# Patient Record
Sex: Female | Born: 1955 | Race: Black or African American | Hispanic: No | Smoking: Never smoker
Health system: Southern US, Community
[De-identification: ages and names within clinical notes are randomized; demographics above are authoritative.]

## PROBLEM LIST (undated history)

## (undated) DIAGNOSIS — U071 COVID-19: Secondary | ICD-10-CM

## (undated) DIAGNOSIS — H269 Unspecified cataract: Secondary | ICD-10-CM

## (undated) DIAGNOSIS — D509 Iron deficiency anemia, unspecified: Secondary | ICD-10-CM

## (undated) DIAGNOSIS — E559 Vitamin D deficiency, unspecified: Secondary | ICD-10-CM

## (undated) DIAGNOSIS — M1990 Unspecified osteoarthritis, unspecified site: Secondary | ICD-10-CM

## (undated) DIAGNOSIS — K5100 Ulcerative (chronic) pancolitis without complications: Secondary | ICD-10-CM

## (undated) DIAGNOSIS — G43909 Migraine, unspecified, not intractable, without status migrainosus: Secondary | ICD-10-CM

## (undated) DIAGNOSIS — M419 Scoliosis, unspecified: Secondary | ICD-10-CM

## (undated) DIAGNOSIS — D569 Thalassemia, unspecified: Secondary | ICD-10-CM

## (undated) DIAGNOSIS — K219 Gastro-esophageal reflux disease without esophagitis: Secondary | ICD-10-CM

## (undated) HISTORY — PX: BREAST EXCISIONAL BIOPSY: SUR124

## (undated) HISTORY — PX: COLONOSCOPY: SHX174

## (undated) HISTORY — DX: Ulcerative (chronic) pancolitis without complications: K51.00

## (undated) HISTORY — DX: Vitamin D deficiency, unspecified: E55.9

## (undated) HISTORY — PX: BACK SURGERY: SHX140

## (undated) HISTORY — PX: ABDOMINAL HYSTERECTOMY: SHX81

## (undated) HISTORY — PX: BREAST SURGERY: SHX581

## (undated) HISTORY — PX: KNEE SURGERY: SHX244

## (undated) HISTORY — DX: Unspecified osteoarthritis, unspecified site: M19.90

## (undated) HISTORY — DX: COVID-19: U07.1

## (undated) HISTORY — DX: Thalassemia, unspecified: D56.9

## (undated) HISTORY — DX: Iron deficiency anemia, unspecified: D50.9

## (undated) HISTORY — DX: Unspecified cataract: H26.9

## (undated) HISTORY — DX: Gastro-esophageal reflux disease without esophagitis: K21.9

## (undated) HISTORY — DX: Migraine, unspecified, not intractable, without status migrainosus: G43.909

---

## 2001-02-15 HISTORY — PX: ABDOMINAL HYSTERECTOMY: SHX81

## 2009-12-31 ENCOUNTER — Ambulatory Visit: Payer: Self-pay | Admitting: Family Medicine

## 2009-12-31 ENCOUNTER — Encounter: Payer: Self-pay | Admitting: Family Medicine

## 2009-12-31 DIAGNOSIS — R002 Palpitations: Secondary | ICD-10-CM | POA: Insufficient documentation

## 2009-12-31 DIAGNOSIS — J45909 Unspecified asthma, uncomplicated: Secondary | ICD-10-CM | POA: Insufficient documentation

## 2009-12-31 DIAGNOSIS — R0602 Shortness of breath: Secondary | ICD-10-CM | POA: Insufficient documentation

## 2009-12-31 DIAGNOSIS — J309 Allergic rhinitis, unspecified: Secondary | ICD-10-CM | POA: Insufficient documentation

## 2010-01-02 LAB — CONVERTED CEMR LAB
BUN: 15 mg/dL (ref 6–23)
Basophils Absolute: 0 10*3/uL (ref 0.0–0.1)
Basophils Relative: 0.8 % (ref 0.0–3.0)
CO2: 29 meq/L (ref 19–32)
Chloride: 103 meq/L (ref 96–112)
Creatinine, Ser: 0.8 mg/dL (ref 0.4–1.2)
Eosinophils Absolute: 0.1 10*3/uL (ref 0.0–0.7)
Glucose, Bld: 86 mg/dL (ref 70–99)
MCHC: 32.6 g/dL (ref 30.0–36.0)
MCV: 70.9 fL — ABNORMAL LOW (ref 78.0–100.0)
Monocytes Absolute: 0.4 10*3/uL (ref 0.1–1.0)
Neutrophils Relative %: 21 % — ABNORMAL LOW (ref 43.0–77.0)
Platelets: 267 10*3/uL (ref 150.0–400.0)
RDW: 15.1 % — ABNORMAL HIGH (ref 11.5–14.6)
TSH: 0.7 microintl units/mL (ref 0.35–5.50)

## 2010-01-05 ENCOUNTER — Telehealth (INDEPENDENT_AMBULATORY_CARE_PROVIDER_SITE_OTHER): Payer: Self-pay | Admitting: *Deleted

## 2010-01-16 ENCOUNTER — Ambulatory Visit: Payer: Self-pay

## 2010-01-16 ENCOUNTER — Encounter: Payer: Self-pay | Admitting: Family Medicine

## 2010-01-20 ENCOUNTER — Telehealth (INDEPENDENT_AMBULATORY_CARE_PROVIDER_SITE_OTHER): Payer: Self-pay | Admitting: *Deleted

## 2010-01-27 ENCOUNTER — Ambulatory Visit: Payer: Self-pay | Admitting: Family Medicine

## 2010-01-27 DIAGNOSIS — G43909 Migraine, unspecified, not intractable, without status migrainosus: Secondary | ICD-10-CM | POA: Insufficient documentation

## 2010-01-27 DIAGNOSIS — E069 Thyroiditis, unspecified: Secondary | ICD-10-CM | POA: Insufficient documentation

## 2010-01-29 ENCOUNTER — Telehealth (INDEPENDENT_AMBULATORY_CARE_PROVIDER_SITE_OTHER): Payer: Self-pay | Admitting: *Deleted

## 2010-01-29 LAB — CONVERTED CEMR LAB
Basophils Absolute: 0.1 10*3/uL (ref 0.0–0.1)
HCT: 36.6 % (ref 36.0–46.0)
Lymphs Abs: 2.5 10*3/uL (ref 0.7–4.0)
Monocytes Relative: 5 % (ref 3.0–12.0)
Platelets: 292 10*3/uL (ref 150.0–400.0)
RDW: 15.3 % — ABNORMAL HIGH (ref 11.5–14.6)
TSH: 0.85 microintl units/mL (ref 0.35–5.50)

## 2010-02-05 ENCOUNTER — Telehealth (INDEPENDENT_AMBULATORY_CARE_PROVIDER_SITE_OTHER): Payer: Self-pay | Admitting: *Deleted

## 2010-03-17 NOTE — Procedures (Signed)
Summary: summary report  summary report   Imported By: Parks Neptune 01/20/2010 16:34:13  _____________________________________________________________________  External Attachment:    Type:   Image     Comment:   External Document  Appended Document: summary report holter results normal- just having a few extra beats.  please call and let pt know  Appended Document: summary report See phone note

## 2010-03-17 NOTE — Progress Notes (Signed)
Summary: holter monitor  Phone Note Outgoing Call   Call placed by: Susette Racer,  January 05, 2010 1:18 PM Action Taken: Appt scheduled Summary of Call: Pt has appt. for 12/2 11 at 2:45pm to have monitor Initial call taken by: Susette Racer,  January 05, 2010 1:19 PM

## 2010-03-17 NOTE — Assessment & Plan Note (Signed)
Summary: NEW TO EST/KN   Vital Signs:  Patient profile:   55 year old female Height:      61.25 inches Weight:      202 pounds BMI:     37.99 Pulse rate:   105 / minute BP sitting:   126 / 78  (left arm)  Vitals Entered By: Malachi Bonds CMA (December 31, 2009 2:51 PM) CC: NEW EST- SOB and heart racing x1 month    History of Present Illness: 55 yo woman here today to establish care.  previous MD- Ruben Gottron (3-4 yrs ago).  Asthma- not currently on meds but does note SOB, chest tightness.  now c/o 'phlegm' in chest- cough is nonproductive.  denies wheezing.  palpitations- sxs started 1 month ago.  sxs occuring daily.  no relation to time of day or activity.  sxs can last minutes to hours.  will have associated lightheadedness.  denies CP.  does have associated SOB- occurs after the heart begins to race.  no hx of similar.  denies associated nausea.  family and friends tell her she is having panic attacks.  pt admits to 'always being a high stress person', denies any increase in stress recently.  Preventive Screening-Counseling & Management  Alcohol-Tobacco     Alcohol drinks/day: 0     Smoking Status: never  Caffeine-Diet-Exercise     Does Patient Exercise: no      Drug Use:  never.    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 No apparent alopecia or balding. Neck:  No deformities, masses, or tenderness noted. Lungs:  Normal respiratory effort, chest expands symmetrically. Lungs are clear to auscultation, no crackles or wheezes. Heart:  Normal rate and regular rhythm. S1 and S2 normal without gallop, murmur, click, rub or other extra sounds. Pulses:  +2 carotid, radial, DP Extremities:  No clubbing, cyanosis, edema, or deformity noted with normal full range of motion of all joints.   Cervical Nodes:  No lymphadenopathy noted Psych:  anxious   Impression & Recommendations:  Problem # 1:  PALPITATIONS (ICD-785.1) Assessment New must r/o arrhythmia- EKG normal but will order holter.  check electrolytes, CBC to r/o anemia, TSH.  may be stress/anxiety related.  alprazolam given to see if sxs improve w/ this. Orders: Venipuncture (02774) TLB-BMP (Basic Metabolic Panel-BMET) (12878-MVEHMCN) TLB-CBC Platelet - w/Differential (85025-CBCD) TLB-TSH (Thyroid Stimulating Hormone) (84443-TSH) Specimen Handling (99000) Cardiology Referral (Cardiology)  Problem # 2:  SHORTNESS OF BREATH (ICD-786.05) Assessment: New no wheezing on exam, no cough heard during OV.  reports sxs start after she is having palpitations.  ? whether this is anxiety related.  will follow closely.  Complete Medication List: 1)  Alprazolam 0.5 Mg Tabs (Alprazolam) .... Take 1 every 4 hrs as needed for anxiety.  Patient Instructions: 1)  Follow up in 3-4 weeks to discuss palpitations- sooner if  needed 2)  If you develop chest pain or pressure- go to the ER 3)  Someone will call you with your Holter monitor 4)  We'll notify you of your lab results 5)  If you have another episode, take the Alprazolam and see if this improves your symptoms 6)  Call with any questions or concerns 7)  Welcome!  We're glad to have you! Prescriptions: ALPRAZOLAM 0.5 MG  TABS (ALPRAZOLAM)  take 1 every 4 hrs as needed for anxiety.  #30 x 0   Entered and Authorized by:   [name] MD   Signed by:   [name] MD on 12/31/2009   Method used:   Print then Give to Patient   RxID:   [id number]    Orders Added: 1)  Venipuncture [09470] 2)  TLB-BMP (Basic Metabolic Panel-BMET) [[id number]] 3)  TLB-CBC Platelet - w/Differential [85025-CBCD] 4)  TLB-TSH (Thyroid Stimulating Hormone) [84443-TSH] 5)  Specimen Handling [99000] 6)  Cardiology Referral [Cardiology] 7)  New Patient Level II [54650]

## 2010-03-19 NOTE — Assessment & Plan Note (Signed)
Summary: rto 3-4 weeks/cbs   Vital Signs:  Patient profile:   55 year old female Weight:      200 pounds Pulse rate:   96 / minute BP sitting:   124 / 80  (left arm)  Vitals Entered By: Malachi Bonds CMA (January 27, 2010 10:52 AM) CC: f/u and would like med for migraines    History of Present Illness: 55 yo woman here today for   1) palpitations- pt reports a few mild episodes.  did not have an episode 'severe enough' to take the Alprazolam.  holter report was normal.  feels sxs are improving.  able to take a few deep breaths and calm herself down per report.  2) SOB- denies the extreme sxs she was having previously.  will still have mild episodes that accompany the palpitations.  3) Migraine- hx of similar, started at age 34.  took OTC Motrin w/out relief.  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  will have preceeding visual changes.  previously on preventative meds.  has photo and phonophobia.  + nausea.  Current Medications (verified): 1)  Alprazolam 0.5 Mg  Tabs (Alprazolam) .... Take 1 Every 4 Hrs As Needed For Anxiety. 2)  Sumatriptan Succinate 50 Mg Tabs (Sumatriptan Succinate) .Marland Kitchen.. 1 Tab By Mouth As Needed For Migraine.  If No Relief in 2 Hrs May Take 2nd Pill 3)  Promethazine Hcl 25 Mg  Tabs (Promethazine Hcl) .Marland Kitchen.. 1 Tab By Mouth Q6 As Needed For Nausea  Allergies (verified): No Known Drug Allergies  Past History:  Past medical, surgical, family and social histories (including risk factors) reviewed for relevance to current acute and chronic problems.  Past Medical History: Reviewed history from 12/31/2009 and no changes required. Migraines Asthma Arthritis  Allergic rhinitis  Past Surgical History: Reviewed history from 12/31/2009 and no changes required. Hysterectomy- ovaries remain L knee surgery (meniscus tear)  Family History: Reviewed history from 12/31/2009 and no changes required. CAD-no HTN-mother DM-no COLON CA-no STROKE-no COLON  CA-no mother died of PE  Social History: Reviewed history from 12/31/2009 and no changes required. 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 No apparent alopecia or balding. Eyes:  PERRL, EOMI, fundi normal.  + photophobia Ears:  External ear exam shows no significant lesions or deformities.  Otoscopic examination reveals clear canals, tympanic membranes are intact bilaterally without bulging, retraction, inflammation or discharge. Hearing is grossly normal bilaterally. Neck:  thyroid TTP but not noteably enlarged. Lungs:  Normal respiratory effort, chest expands symmetrically. Lungs are clear to auscultation, no crackles or wheezes. Heart:  Normal rate and regular rhythm. S1 and S2 normal without gallop, murmur, click, rub or other extra sounds. Pulses:  +2 carotid, radial, DP Neurologic:  No cranial nerve deficits noted. Station and gait are normal. Plantar reflexes are down-going bilaterally. DTRs are symmetrical throughout. Sensory, motor and coordinative functions appear intact.   Impression & Recommendations:  Problem # 1:  PALPITATIONS (ICD-785.1) Assessment Improved pt's holter results reviewed.  sxs improving w/ relaxation techniques.  likely anxiety related.  Problem # 2:  SHORTNESS OF BREATH (ICD-786.05) Assessment: Improved denies severe sxs like she was previously having.  will still have mild sxs w/ anxiety.  Problem # 3:  MIGRAINE HEADACHE (ICD-346.90)  Assessment: Deteriorated pt w/ current migraine.  offered toradol injxn but pt unwilling to do this b/c she has never taken it before and reports she must go back to work.  would like meds for after work- reports triptans have worked  previously.  script provided for imitrex and phenergan. Her updated medication list for this problem includes:    Sumatriptan Succinate 50 Mg Tabs (Sumatriptan succinate) .Marland Kitchen... 1 tab by mouth as needed for migraine.  if no relief in 2 hrs may take 2nd pill  Problem # 4:  THYROIDITIS (ICD-245.9) Assessment: New pt's thyroid is tender but not enlarged.  check labs. Orders: Venipuncture (79038) Specimen Handling (99000) TLB-TSH (Thyroid Stimulating Hormone) (84443-TSH) TLB-CBC Platelet - w/Differential (85025-CBCD)  Complete Medication List: 1)  Alprazolam 0.5 Mg Tabs (Alprazolam) .... Take 1 every 4 hrs as needed for anxiety. 2)  Sumatriptan Succinate 50 Mg Tabs (Sumatriptan succinate) .Marland Kitchen.. 1 tab by mouth as needed for migraine.  if no relief in 2 hrs may take 2nd pill 3)  Promethazine Hcl 25 Mg Tabs (Promethazine hcl) .Marland Kitchen.. 1 tab by mouth q6 as needed for nausea 4)  Ferrous Sulfate 325 (65 Fe) Mg Tabs (Ferrous sulfate) .... Take 1 tab once daily  Patient Instructions: 1)  Schedule your complete physical at your convenience- do not eat before this appt 2)  We'll notify you of your lab results 3)  Take the Excedrin Migraine early in the headache to prevent a full blown migraine 4)  Use the Imitrex (sumatriptan) once you have a migraine 5)  Call with any questions or concerns 6)  Happy Holidays!!! Prescriptions: PROMETHAZINE HCL 25 MG  TABS (PROMETHAZINE HCL) 1 tab by mouth Q6 as needed for nausea  #30 x 0   Entered and Authorized by:   [name] MD   Signed by:   [name] MD on 01/27/2010   Method used:   Electronically to        [facility] # [phone]* (retail)       [address]       [place], [place]  [address]       Ph: [phone]       Fax: [phone]   RxID:   [id number] SUMATRIPTAN SUCCINATE 50 MG TABS (SUMATRIPTAN SUCCINATE) 1 tab by mouth as needed for migraine.  if no relief in 2 hrs may take 2nd pill  #12 x 6   Entered and  Authorized by:   [name] MD   Signed by:   [name] MD on 01/27/2010   Method used:   Electronically to        [facility] # [phone]* (retail)       [address], [place]  [address]       Ph: [phone]       Fax: [phone]   RxID:   [id number]    Orders Added: 1)  Venipuncture [41030] 2)  Specimen Handling [13143] 8)  TLB-TSH (Thyroid Stimulating Hormone) [84443-TSH] 4)  TLB-CBC Platelet - w/Differential [85025-CBCD] 5)  Est. Patient Level IV [88757]

## 2010-03-19 NOTE — Progress Notes (Signed)
Summary: 12-6--LAB RESULT-lm  Phone Note Outgoing Call   Call placed by: Rolla Flatten CMA,  January 20, 2010 4:45 PM Details for Reason: holter results normal- just having a few extra beats.  please call and let pt know Summary of Call: left message to call office ..............Marland KitchenFelecia Deloach CMA  January 20, 2010 4:45 PM

## 2010-03-19 NOTE — Progress Notes (Signed)
Summary: f/u on pt-lmom  ---- Converted from flag ---- ---- 02/04/2010 1:14 PM, [name] MD wrote: please call and see if her thyroid pain has improved.  if not, will need thyroid US.  thanks! ------------------------------  Phone Note Outgoing Call   Call placed by: Malachi Bonds CMA,  February 05, 2010 8:14 AM Call placed to: Patient Summary of Call: left message on machine

## 2010-03-19 NOTE — Progress Notes (Signed)
Summary: labs  Phone Note Outgoing Call   Call placed by: Malachi Bonds CMA,  January 29, 2010 10:11 AM Call placed to: Patient Summary of Call: thyroid is normal, no evidence of infxn.  no sign of anemia but MCV is low indicating that she is likely iron deficient.  should start FeSO4 363m daily and stool softener b/c iron is constipating.   Follow-up for Phone Call        left message on machine .............Marland KitchenMalachi BondsCMA  January 29, 2010 10:11 AM  Discuss with patient Rx sent to pharmacy.......Marland Kitchenelecia Deloach CMA  January 29, 2010 11:00 AM     New/Updated Medications: FERROUS SULFATE 325 (65 FE) MG TABS (FERROUS SULFATE) Take 1 tab once daily Prescriptions: FERROUS SULFATE 325 (65 FE) MG TABS (FERROUS SULFATE) Take 1 tab once daily  #30 x 3   Entered by:   FRolla FlattenCMA   Authorized by:   KAnnye AsaMD   Signed by:   FRolla FlattenCMA on 01/29/2010   Method used:   Faxed to ...       [facility] # [phone] (retail)       [phone]      [place]  [address]      Ph: [phone]      Fax: [phone]  RxID:   [id number]

## 2012-05-19 ENCOUNTER — Ambulatory Visit (INDEPENDENT_AMBULATORY_CARE_PROVIDER_SITE_OTHER): Payer: BC Managed Care – PPO | Admitting: Family Medicine

## 2012-05-19 ENCOUNTER — Encounter: Payer: Self-pay | Admitting: Family Medicine

## 2012-05-19 VITALS — BP 118/70 | HR 88 | Temp 98.4°F | Ht 61.75 in | Wt 210.4 lb

## 2012-05-19 DIAGNOSIS — E2839 Other primary ovarian failure: Secondary | ICD-10-CM

## 2012-05-19 DIAGNOSIS — Z1331 Encounter for screening for depression: Secondary | ICD-10-CM

## 2012-05-19 DIAGNOSIS — G43909 Migraine, unspecified, not intractable, without status migrainosus: Secondary | ICD-10-CM

## 2012-05-19 DIAGNOSIS — Z1231 Encounter for screening mammogram for malignant neoplasm of breast: Secondary | ICD-10-CM

## 2012-05-19 DIAGNOSIS — Z1211 Encounter for screening for malignant neoplasm of colon: Secondary | ICD-10-CM

## 2012-05-19 DIAGNOSIS — Z0000 Encounter for general adult medical examination without abnormal findings: Secondary | ICD-10-CM

## 2012-05-19 DIAGNOSIS — L723 Sebaceous cyst: Secondary | ICD-10-CM

## 2012-05-19 LAB — HEPATIC FUNCTION PANEL
AST: 15 U/L (ref 0–37)
Albumin: 4.3 g/dL (ref 3.5–5.2)
Alkaline Phosphatase: 85 U/L (ref 39–117)
Bilirubin, Direct: 0.1 mg/dL (ref 0.0–0.3)
Indirect Bilirubin: 0.4 mg/dL (ref 0.0–0.9)
Total Bilirubin: 0.5 mg/dL (ref 0.3–1.2)

## 2012-05-19 LAB — CBC WITH DIFFERENTIAL/PLATELET
Basophils Absolute: 0 10*3/uL (ref 0.0–0.1)
Basophils Relative: 0 % (ref 0–1)
Eosinophils Absolute: 0.1 10*3/uL (ref 0.0–0.7)
Hemoglobin: 12.9 g/dL (ref 12.0–15.0)
MCH: 22.2 pg — ABNORMAL LOW (ref 26.0–34.0)
MCHC: 33.1 g/dL (ref 30.0–36.0)
Monocytes Relative: 5 % (ref 3–12)
Neutro Abs: 5 10*3/uL (ref 1.7–7.7)
Neutrophils Relative %: 59 % (ref 43–77)
Platelets: 336 10*3/uL (ref 150–400)

## 2012-05-19 LAB — LIPID PANEL
HDL: 68 mg/dL (ref >39)
LDL Cholesterol: 99 mg/dL (ref 0–99)
Total CHOL/HDL Ratio: 2.7 Ratio
Triglycerides: 72 mg/dL (ref <150)

## 2012-05-19 LAB — BASIC METABOLIC PANEL
Calcium: 9.7 mg/dL (ref 8.4–10.5)
Glucose, Bld: 85 mg/dL (ref 70–99)
Potassium: 4.1 mEq/L (ref 3.5–5.3)
Sodium: 141 mEq/L (ref 135–145)

## 2012-05-19 MED ORDER — ONDANSETRON 4 MG PO TBDP: 4.0000 mg | ORAL_TABLET | Freq: Three times a day (TID) | ORAL | Status: DC | PRN

## 2012-05-19 MED ORDER — SUMATRIPTAN SUCCINATE 100 MG PO TABS: 100.0000 mg | ORAL_TABLET | ORAL | Status: DC | PRN

## 2012-05-19 NOTE — Patient Instructions (Addendum)
We'll notify you of your lab results and make any changes if needed Someone will call you with your bone density, mammo and colonoscopy appts Take the imitrex as needed for migraines zofran as needed for nausea Someone will call you with your neuro appt Call with any questions or concerns [place]!

## 2012-05-19 NOTE — Progress Notes (Signed)
  Subjective:    Patient ID: [name], female    DOB: 08/09/1955, 57 y.o.   MRN: [id number]  HPI CPE- overdue on mammo, bone density, colonoscopy.  No need for paps due to hysterectomy.  Migraines- chronic problem, occuring ~5 days weekly.  No relief w/ tylenol or ibuprofen.  Has taken imitrex previously w/ good relief.  Previously seeing [facility].  + nausea.  + photo/phonophobia.  No auras.  Sebaceous cyst- L upper back, very swollen, tender.  Will drain when squeezed.  No surrounding redness.   Review of Systems Patient reports no vision/ hearing changes, adenopathy, fever, weight change,  persistant/recurrent hoarseness , swallowing issues, chest pain, edema, persistant/recurrent cough, hemoptysis, dyspnea (rest/exertional/paroxysmal nocturnal), gastrointestinal bleeding (melena, rectal bleeding), abdominal pain, significant heartburn, bowel changes, GU symptoms (dysuria, hematuria, incontinence), Gyn symptoms (abnormal  bleeding, pain),  syncope, focal weakness, memory loss, numbness & tingling, skin/nail changes, abnormal bruising or bleeding, anxiety, or depression.   + intermittent palpitations + hair loss    Objective:   Physical Exam  General Appearance:    Alert, cooperative, no distress, appears stated age  Head:    Normocephalic, without obvious abnormality, atraumatic  Eyes:    PERRL, conjunctiva/corneas clear, EOM's intact, fundi    benign, both eyes  Ears:    Normal TM's and external ear canals, both ears  Nose:   Nares normal, septum midline, mucosa normal, no drainage    or sinus tenderness  Throat:   Lips, mucosa, and tongue normal; teeth and gums normal  Neck:   Supple, symmetrical, trachea midline, no adenopathy;    Thyroid: no enlargement/tenderness/nodules  Back:     Symmetric, no curvature, ROM normal, no CVA tenderness  Lungs:     Clear to auscultation bilaterally, respirations unlabored  Chest Wall:    No tenderness or deformity   Heart:     Regular rate and rhythm, S1 and S2 normal, no murmur, rub   or gallop  Breast Exam:    No tenderness, masses, or nipple abnormality  Abdomen:     Soft, non-tender, bowel sounds active all four quadrants,    no masses, no organomegaly  Genitalia:    deferred  Rectal:    Extremities:   Extremities normal, atraumatic, no cyanosis or edema  Pulses:   2+ and symmetric all extremities  Skin:   Skin color, texture, turgor normal, no rashes or lesions.  Large sebaceous cyst on upper back just L of spine- copious foul smelling curd like material expressed w/ firm, continuous pressure  Lymph nodes:   Cervical, supraclavicular, and axillary nodes normal  Neurologic:   CNII-XII intact, normal strength, sensation and reflexes    throughout          Assessment & Plan:

## 2012-05-22 ENCOUNTER — Encounter: Payer: Self-pay | Admitting: *Deleted

## 2012-05-23 ENCOUNTER — Encounter: Payer: Self-pay | Admitting: Internal Medicine

## 2012-05-23 LAB — VITAMIN D 1,25 DIHYDROXY: Vitamin D 1, 25 (OH)2 Total: 108 pg/mL — ABNORMAL HIGH (ref 18–72)

## 2012-05-23 NOTE — Assessment & Plan Note (Signed)
New.  Pt tolerated drainage of cyst w/out difficulty.  Cyst is large, material expressed was foul smelling and there was a lot of it.  Reviewed w/ pt that only definitive tx to prevent recurrence would be surgical excision.  Pt aware but wants to hold on referral at this time.

## 2012-05-23 NOTE — Assessment & Plan Note (Signed)
Chronic problem.  Deteriorated.  Start triptan prn.  Refer to neuro due to increased frequency and severity of HAs.

## 2012-05-23 NOTE — Assessment & Plan Note (Signed)
Pt's PE WNL w/ exception of sebaceous cyst.  Overdue on health maintenance.  Referrals entered.  Check labs.  Anticipatory guidance provided.

## 2012-06-02 ENCOUNTER — Ambulatory Visit (INDEPENDENT_AMBULATORY_CARE_PROVIDER_SITE_OTHER): Payer: BC Managed Care – PPO | Admitting: Neurology

## 2012-06-02 ENCOUNTER — Encounter: Payer: Self-pay | Admitting: Neurology

## 2012-06-02 VITALS — BP 128/91 | HR 82 | Ht 61.0 in | Wt 209.0 lb

## 2012-06-02 DIAGNOSIS — G43909 Migraine, unspecified, not intractable, without status migrainosus: Secondary | ICD-10-CM

## 2012-06-02 MED ORDER — MAGNESIUM OXIDE -MG SUPPLEMENT 400 (240 MG) MG PO TABS: 400.0000 mg | ORAL_TABLET | Freq: Two times a day (BID) | ORAL | Status: DC

## 2012-06-02 MED ORDER — RIZATRIPTAN BENZOATE 10 MG PO TABS: 10.0000 mg | ORAL_TABLET | ORAL | Status: DC | PRN

## 2012-06-02 MED ORDER — RIBOFLAVIN 100 MG PO CAPS: 100.0000 mg | ORAL_CAPSULE | Freq: Two times a day (BID) | ORAL | Status: DC

## 2012-06-02 NOTE — Patient Instructions (Signed)
May take Maxalt together with Aleve

## 2012-06-02 NOTE — Progress Notes (Signed)
Review of Systems  Out of a complete 14 system review, the patient complains of only the following symptoms, and all other reviewed systems are negative.   Constitutional:   N/A Cardiovascular:  Swelling in legs Ear/Nose/Throat:  N/A Skin: N/A Eyes: N/A Respiratory: shortness of breath Gastroitestinal: N/A    Hematology/Lymphatic:  N/A Endocrine:  Increased thirst Musculoskeletal:  joint swelling,  aching muscles  Allergy/Immunology: running nose Neurological: Insomnia, restless leg Psychiatric:    Not enough sleep  PHYSICAL EXAMINATOINS:  Generalized: In no acute distress  Neck: Supple, no carotid bruits   Cardiac: Regular rate rhythm  Pulmonary: Clear to auscultation bilaterally  Musculoskeletal: No deformity  Neurological examination  Mentation: Alert oriented to time, place, history taking, and causual conversation  Cranial nerve II-XII: Pupils were equal round reactive to light extraocular movements were full, visual field were full on confrontational test. facial sensation and strength were normal. hearing was intact to finger rubbing bilaterally. Uvula tongue midline.  head turning and shoulder shrug and were normal and symmetric.Tongue protrusion into cheek strength was normal.  Motor: normal tone, bulk and strength.  Sensory: Intact to fine touch, pinprick, preserved vibratory sensation, and proprioception at toes.  Coordination: Normal finger to nose, heel-to-shin bilaterally there was no truncal ataxia  Gait: Rising up from seated position without assistance, normal stance, without trunk ataxia, moderate stride, good arm swing, smooth turning, able to perform tiptoe, and heel walking without difficulty.   Romberg signs: Negative  Deep tendon reflexes: Brachioradialis 2/2, biceps 2/2, triceps 2/2, patellar 2/2, Achilles 2/2, plantar responses were flexor bilaterally.  Assessment and plan: 57 years old right-handed [place] American female, with a long-standing  history of migraine headaches, now with frequent headaches,  1. Preventive medications magnesium oxide,riboflavin bid, 2. Maxalt prn. 3. RTC with Hoyle Sauer in 3 months

## 2012-06-23 ENCOUNTER — Ambulatory Visit
Admission: RE | Admit: 2012-06-23 | Discharge: 2012-06-23 | Disposition: A | Discharge status: Home / Self Care | Payer: BC Managed Care – PPO | Source: Ambulatory Visit | Attending: Family Medicine | Admitting: Family Medicine

## 2012-06-23 DIAGNOSIS — E2839 Other primary ovarian failure: Secondary | ICD-10-CM

## 2012-06-23 DIAGNOSIS — Z1231 Encounter for screening mammogram for malignant neoplasm of breast: Secondary | ICD-10-CM

## 2012-06-26 ENCOUNTER — Telehealth: Payer: Self-pay | Admitting: Nurse Practitioner

## 2012-06-26 ENCOUNTER — Ambulatory Visit (AMBULATORY_SURGERY_CENTER): Payer: BC Managed Care – PPO | Admitting: *Deleted

## 2012-06-26 ENCOUNTER — Encounter: Payer: Self-pay | Admitting: Internal Medicine

## 2012-06-26 VITALS — Ht 61.0 in | Wt 212.4 lb

## 2012-06-26 DIAGNOSIS — Z1211 Encounter for screening for malignant neoplasm of colon: Secondary | ICD-10-CM

## 2012-06-26 MED ORDER — MOVIPREP 100 G PO SOLR: 1.0000 | Freq: Once | ORAL | Status: DC

## 2012-06-27 ENCOUNTER — Encounter: Payer: Self-pay | Admitting: General Practice

## 2012-07-07 ENCOUNTER — Ambulatory Visit (AMBULATORY_SURGERY_CENTER): Payer: BC Managed Care – PPO | Admitting: Internal Medicine

## 2012-07-07 ENCOUNTER — Encounter: Payer: Self-pay | Admitting: Internal Medicine

## 2012-07-07 VITALS — BP 136/77 | HR 69 | Temp 97.9°F | Resp 15 | Ht 61.0 in | Wt 212.0 lb

## 2012-07-07 DIAGNOSIS — K633 Ulcer of intestine: Secondary | ICD-10-CM

## 2012-07-07 DIAGNOSIS — K5289 Other specified noninfective gastroenteritis and colitis: Secondary | ICD-10-CM

## 2012-07-07 DIAGNOSIS — Z1211 Encounter for screening for malignant neoplasm of colon: Secondary | ICD-10-CM

## 2012-07-07 MED ORDER — SODIUM CHLORIDE 0.9 % IV SOLN: 500.0000 mL | INTRAVENOUS | Status: DC

## 2012-07-07 NOTE — Patient Instructions (Addendum)
YOU HAD AN ENDOSCOPIC PROCEDURE TODAY AT [facility]: Refer to the procedure report that was given to you for any specific questions about what was found during the examination.  If the procedure report does not answer your questions, please call your gastroenterologist to clarify.  If you requested that your care partner not be given the details of your procedure findings, then the procedure report has been included in a sealed envelope for you to review at your convenience later.  YOU SHOULD EXPECT: Some feelings of bloating in the abdomen. Passage of more gas than usual.  Walking can help get rid of the air that was put into your GI tract during the procedure and reduce the bloating. If you had a lower endoscopy (such as a colonoscopy or flexible sigmoidoscopy) you may notice spotting of blood in your stool or on the toilet paper. If you underwent a bowel prep for your procedure, then you may not have a normal bowel movement for a few days.  DIET: Your first meal following the procedure should be a light meal and then it is ok to progress to your normal diet.  A half-sandwich or bowl of soup is an example of a good first meal.  Heavy or fried foods are harder to digest and may make you feel nauseous or bloated.  Likewise meals heavy in dairy and vegetables can cause extra gas to form and this can also increase the bloating.  Drink plenty of fluids but you should avoid alcoholic beverages for 24 hours.  ACTIVITY: Your care partner should take you home directly after the procedure.  You should plan to take it easy, moving slowly for the rest of the day.  You can resume normal activity the day after the procedure however you should NOT DRIVE or use heavy machinery for 24 hours (because of the sedation medicines used during the test).    SYMPTOMS TO REPORT IMMEDIATELY: A gastroenterologist can be reached at any hour.  During normal business hours, 8:30 AM to 5:00 PM Monday through Friday,  call [phone].  After hours and on weekends, please call the GI answering service at [phone] who will take a message and have the physician on call contact you.   Following lower endoscopy (colonoscopy or flexible sigmoidoscopy):  Excessive amounts of blood in the stool  Significant tenderness or worsening of abdominal pains  Swelling of the abdomen that is new, acute  Fever of 100F or higher    FOLLOW UP: If any biopsies were taken you will be contacted by phone or by letter within the next 1-3 weeks.  Call your gastroenterologist if you have not heard about the biopsies in 3 weeks.  Our staff will call the home number listed on your records the next business day following your procedure to check on you and address any questions or concerns that you may have at that time regarding the information given to you following your procedure. This is a courtesy call and so if there is no answer at the home number and we have not heard from you through the emergency physician on call, we will assume that you have returned to your regular daily activities without incident.  SIGNATURES/CONFIDENTIALITY: You and/or your care partner have signed paperwork which will be entered into your electronic medical record.  These signatures attest to the fact that that the information above on your After Visit Summary has been reviewed and is understood.  Full responsibility of the confidentiality  of this discharge information lies with you and/or your care-partner.   Await biopsy results.   Next colonoscopy will be scheduled after pathology results are reviewed by Dr. Hilarie Fredrickson.  He will let you know.

## 2012-07-07 NOTE — Progress Notes (Signed)
Patient did not experience any of the following events: a burn prior to discharge; a fall within the facility; wrong site/side/patient/procedure/implant event; or a hospital transfer or hospital admission upon discharge from the facility. (G8907) Patient did not have preoperative order for IV antibiotic SSI prophylaxis. (G8918)  

## 2012-07-07 NOTE — Progress Notes (Signed)
Called to room to assist during endoscopic procedure.  Patient ID and intended procedure confirmed with present staff. Received instructions for my participation in the procedure from the performing physician.  

## 2012-07-07 NOTE — Op Note (Signed)
[place]  [address] [place], [id number]   COLONOSCOPY PROCEDURE REPORT  PATIENT: Patricia, Benjamin  MR#: [id number] BIRTHDATE: 05-16-1955 , 79  yrs. old GENDER: Female ENDOSCOPIST: Jerene Bears, MD REFERRED [id number] Birdie Riddle, M.D. PROCEDURE DATE:  07/07/2012 PROCEDURE:   Colonoscopy with biopsy ASA CLASS:   Class II INDICATIONS:average risk screening and Last colonoscopy performed greater than 20 years ago ([place] and follow-up in [place], diagnosed with UC, prolonged clinical remission). MEDICATIONS: MAC sedation, administered by CRNA and Propofol (Diprivan) 230 mg IV  DESCRIPTION OF PROCEDURE:   After the risks benefits and alternatives of the procedure were thoroughly explained, informed consent was obtained.  A digital rectal exam revealed decreased sphincter tone and A digital rectal exam revealed no rectal mass. The LB [id number]  endoscope was introduced through the anus and advanced to the terminal ileum which was intubated for a short distance. No adverse events experienced.   The quality of the prep was good, using MoviPrep  The instrument was then slowly withdrawn as the colon was fully examined.   COLON FINDINGS: The mucosa appeared normal in the terminal ileum. Very mild, small and shallow aphthous ulceration without significant associated erythema was found throughout the entire colon (rectum to cecum).  Multiple biopsies were performed using cold forceps.  No other abnormalities seen. Retroflexed views revealed no abnormalities. The time to cecum=2 minutes 42 seconds. Withdrawal time=10 minutes 21 seconds.  The scope was withdrawn and the procedure completed. COMPLICATIONS: There were no complications.  ENDOSCOPIC IMPRESSION: 1.   Normal mucosa in the terminal ileum 2.   Mild aphthous ulceration throughout the colon; multiple biopsies were performed using cold forceps  RECOMMENDATIONS: 1.  Await pathology results 2.  Timing of  repeat colonoscopy will be determined by pathology findings. 3.  You will receive a letter within 1-2 weeks with the results of your biopsy as well as final recommendations.  Please call my office if you have not received a letter after 3 weeks.   eSigned:  Jerene Bears, MD 07/07/2012 8:48 AM   cc: The Patient

## 2012-07-11 ENCOUNTER — Telehealth: Payer: Self-pay | Admitting: *Deleted

## 2012-07-11 NOTE — Telephone Encounter (Signed)
No identifier, left message follow-up

## 2012-07-13 ENCOUNTER — Encounter: Payer: Self-pay | Admitting: Internal Medicine

## 2012-07-14 ENCOUNTER — Telehealth: Payer: Self-pay | Admitting: *Deleted

## 2012-07-14 NOTE — Telephone Encounter (Signed)
Informed pt Dr Hilarie Fredrickson would like for her to schedule a f/u appt. Pt stated understanding and will come 07/25/12 at 0900am.

## 2012-07-14 NOTE — Telephone Encounter (Signed)
Letter from: Jerene Bears   Reason for Letter: Results Review   Comments: recall to be determined   Caren Griffins, please schedule office visit for followup-ulcerative colitis         Letters   lmom for pt to call back.

## 2012-07-20 ENCOUNTER — Encounter: Payer: Self-pay | Admitting: Internal Medicine

## 2012-07-25 ENCOUNTER — Ambulatory Visit (INDEPENDENT_AMBULATORY_CARE_PROVIDER_SITE_OTHER): Payer: BC Managed Care – PPO | Admitting: Internal Medicine

## 2012-07-25 ENCOUNTER — Encounter: Payer: Self-pay | Admitting: Internal Medicine

## 2012-07-25 VITALS — BP 118/78 | HR 86 | Ht 61.0 in | Wt 209.8 lb

## 2012-07-25 DIAGNOSIS — K5100 Ulcerative (chronic) pancolitis without complications: Secondary | ICD-10-CM | POA: Insufficient documentation

## 2012-07-25 DIAGNOSIS — K5180 Other ulcerative colitis without complications: Secondary | ICD-10-CM

## 2012-07-25 DIAGNOSIS — K51918 Ulcerative colitis, unspecified with other complication: Secondary | ICD-10-CM

## 2012-07-25 MED ORDER — MESALAMINE 1.2 G PO TBEC: 1200.0000 mg | DELAYED_RELEASE_TABLET | Freq: Four times a day (QID) | ORAL | Status: DC

## 2012-07-25 NOTE — Patient Instructions (Addendum)
We have sent the following medications to your pharmacy for you to pick up at your convenience: Lialda; you were given samples today and a prescription was sent to your pharmacy; please take as directed for 8 weeks  Follow up with Dr. Hilarie Fredrickson in office in 6 weeks                                                We are excited to introduce MyChart, a new best-in-class service that provides you online access to important information in your electronic medical record. We want to make it easier for you to view your health information - all in one secure location - when and where you need it. We expect MyChart will enhance the quality of care and service we provide.  When you register for MyChart, you can:    View your test results.    Request appointments and receive appointment reminders via email.    Request medication renewals.    View your medical history, allergies, medications and immunizations.    Communicate with your physician's office through a password-protected site.    Conveniently print information such as your medication lists.  To find out if MyChart is right for you, please talk to a member of our clinical staff today. We will gladly answer your questions about this free health and wellness tool.  If you are age 40 or older and want a member of your family to have access to your record, you must provide written consent by completing a proxy form available at our office. Please speak to our clinical staff about guidelines regarding accounts for patients younger than age 46.  As you activate your MyChart account and need any technical assistance, please call the MyChart technical support line at [phone]) or email your question to [email or web address]. If you email your question(s), please include your name, a return phone number and the best time to reach you.  If you have non-urgent health-related questions, you can send a message to our office through  [place] at [email or web address]. If you have a medical emergency, call 911.  Thank you for using MyChart as your new health and wellness resource!   MyChart licensed from [employer],  1999-2010. Patents Pending.

## 2012-07-25 NOTE — Progress Notes (Signed)
Patient ID: [name], female   DOB: 1955-02-21, 57 y.o.   MRN: [id number] HPI: Mrs. Sukhu is a 57 year old female with a past medical history of migraines, mild asthma, and remote colitis diagnosed in [place] approximately 15 years ago who is seen in followup after screening colonoscopy. She is alone today. Screening colonoscopy was performed on 07/07/2012 which revealed a normal terminal ileum, and mild aphthous ulcerations throughout the colon from cecum to rectum. Biopsies were performed which showed chronic active colitis consistent with inflammatory bowel disease. She returns today to discuss these findings. She reports that overall she feels well though she does have intermittent issues with loose bloody stools, abdominal pain. She has lived with this for many years and there've been times when it has seemed better. When she was diagnosed she recalls being on per rectum medication but has been off for over a decade. She does not recall the medication specifically. She reports now her bowel habits range from a good day which is 2-3 formed stools per day without blood, 25-7 loose stools per day associated with lower abdominal cramping. She reports a good appetite without nausea or vomiting. No heartburn, dysphagia or odynophagia. No mouth ulcers, rashes, eye complaints. She does have issues with knee pain. No family history of inflammatory bowel disease. No fevers or chills. No weight loss.  Patient Active Problem List   Diagnosis Date Noted  . Ulcerative colitis, universal 07/25/2012  . Routine general medical examination at a health care facility 05/19/2012  . Sebaceous cyst 05/19/2012  . THYROIDITIS 01/27/2010  . MIGRAINE HEADACHE 01/27/2010  . ALLERGIC RHINITIS 12/31/2009  . ASTHMA 12/31/2009  . PALPITATIONS 12/31/2009  . SHORTNESS OF BREATH 12/31/2009    Past Surgical History  Procedure Laterality Date  . Abdominal hysterectomy      partial  . Knee surgery  12-13,11-10  . Breast  surgery    . Colonoscopy  20 + years ago    In [place].    Current Outpatient Prescriptions  Medication Sig Dispense Refill  . acetaminophen (TYLENOL) 325 MG tablet Take 650 mg by mouth every 6 (six) hours as needed for pain.      . Magnesium Oxide 400 (240 MG) MG TABS Take 400 mg by mouth 2 (two) times daily.  60 tablet  12  . Naproxen Sodium (ALEVE PO) Take 1 tablet by mouth as needed (3-4 x week).       . ondansetron (ZOFRAN-ODT) 4 MG disintegrating tablet Take 1 tablet (4 mg total) by mouth every 8 (eight) hours as needed for nausea.  20 tablet  0  . Riboflavin 100 MG CAPS Take 1 capsule (100 mg total) by mouth 2 (two) times daily.  60 capsule  12  . rizatriptan (MAXALT) 10 MG tablet Take 1 tablet (10 mg total) by mouth as needed for migraine. May repeat in 2 hours if needed  10 tablet  6  . mesalamine (LIALDA) 1.2 G EC tablet Take 1 tablet (1.2 g total) by mouth 4 (four) times daily.  120 tablet  1   No current facility-administered medications for this visit.    No Known Allergies  Family History  Problem Relation Age of Onset  . Diabetes Brother   . Diabetes Daughter   . Sickle cell trait Mother   . Colon cancer Neg Hx     History  Substance Use Topics  . Smoking status: Never Smoker   . Smokeless tobacco: Never Used  . Alcohol Use: No  ROS: As per history of present illness, otherwise negative  BP 118/78  Pulse 86  Ht 5' 1"  (1.549 m)  Wt 209 lb 12.8 oz (95.165 kg)  BMI 39.66 kg/m2  SpO2 98% Constitutional: Well-developed and well-nourished. No distress. HEENT: Normocephalic and atraumatic. Oropharynx is clear and moist. No oropharyngeal exudate. Conjunctivae are normal.  No scleral icterus. Neck: Neck supple. Trachea midline. Cardiovascular: Normal rate, regular rhythm and intact distal pulses.  Pulmonary/chest: Effort normal and breath sounds normal. No wheezing, rales or rhonchi. Abdominal: Soft, diffuse abdominal tenderness without rebound or  guarding, nondistended. Bowel sounds active throughout. There are no masses palpable.  Extremities: no clubbing, cyanosis, or edema Lymphadenopathy: No cervical adenopathy noted. Neurological: Alert and oriented to person place and time. Skin: Skin is warm and dry. No rashes noted. Psychiatric: Normal mood and affect. Behavior is normal.  RELEVANT LABS AND IMAGING: CBC    Component Value Date/Time   WBC 8.6 05/19/2012 1628   RBC 5.82* 05/19/2012 1628   HGB 12.9 05/19/2012 1628   HCT 39.0 05/19/2012 1628   PLT 336 05/19/2012 1628   MCV 67.0* 05/19/2012 1628   MCH 22.2* 05/19/2012 1628   MCHC 33.1 05/19/2012 1628   RDW 16.6* 05/19/2012 1628   LYMPHSABS 3.0 05/19/2012 1628   MONOABS 0.4 05/19/2012 1628   EOSABS 0.1 05/19/2012 1628   BASOSABS 0.0 05/19/2012 1628    CMP     Component Value Date/Time   NA 141 05/19/2012 1628   K 4.1 05/19/2012 1628   CL 103 05/19/2012 1628   CO2 26 05/19/2012 1628   GLUCOSE 85 05/19/2012 1628   BUN 11 05/19/2012 1628   CREATININE 0.73 05/19/2012 1628   CREATININE 0.8 12/31/2009 1529   CALCIUM 9.7 05/19/2012 1628   PROT 7.8 05/19/2012 1628   ALBUMIN 4.3 05/19/2012 1628   AST 15 05/19/2012 1628   ALT 14 05/19/2012 1628   ALKPHOS 85 05/19/2012 1628   BILITOT 0.5 05/19/2012 1628   GFRNONAA 94.13 12/31/2009 1529   Bone mineral density -- normal  ASSESSMENT/PLAN: 57 year old female with a past medical history of migraines, mild asthma, and remote colitis diagnosed in [place] approximately 15 years ago who is seen in followup after screening colonoscopy.  1.  Ulcerative colitis -- at screening colonoscopy she was found to have mild pan ulcerative colitis which is now biopsy-proven. This is likely been present in some form or fashion over the last 15 years dating back to when she was diagnosed when living in [place]. Her symptoms are mild but present and therefore I feel that she warrants treatment. We had a long discussion about clinical and endoscopic remission. Given the mildness of her symptoms  I recommended initiation with a 5-ASA medication. We discussed side effects and I have recommended that her kidney function be followed once yearly when taking this medicine. I will start her on Lialda 4.8 grams daily.  I would like her to take this for 8 weeks and I'll see her back in 6 weeks for reassessment. She is asked to call me if any problems arise from this medication. If it improves her symptoms we may be able to dose reduce to 2.4 g daily. I have advised Pneumovax and annual flu shots, but she declines these today. She has fear of vaccines. I've asked that she consider this further and we can discuss at followup. She has not had significant steroid exposure and has had a bone density study performed recently, which was normal by WHO criteria

## 2012-08-22 ENCOUNTER — Encounter: Payer: Self-pay | Admitting: Internal Medicine

## 2012-08-30 ENCOUNTER — Encounter: Payer: Self-pay | Admitting: Internal Medicine

## 2012-08-30 ENCOUNTER — Ambulatory Visit (INDEPENDENT_AMBULATORY_CARE_PROVIDER_SITE_OTHER): Payer: BC Managed Care – PPO | Admitting: Internal Medicine

## 2012-08-30 VITALS — BP 112/78 | HR 76 | Ht 61.0 in | Wt 212.8 lb

## 2012-08-30 DIAGNOSIS — K51918 Ulcerative colitis, unspecified with other complication: Secondary | ICD-10-CM

## 2012-08-30 DIAGNOSIS — K5180 Other ulcerative colitis without complications: Secondary | ICD-10-CM

## 2012-08-30 MED ORDER — MESALAMINE 1.2 G PO TBEC: 4800.0000 mg | DELAYED_RELEASE_TABLET | Freq: Every day | ORAL | Status: DC

## 2012-08-30 MED ORDER — BUDESONIDE 9 MG PO TB24: 9.0000 mg | ORAL_TABLET | Freq: Every day | ORAL | Status: DC

## 2012-08-30 NOTE — Patient Instructions (Addendum)
Continue taking Lialda tablets daily  We have sent the following medications to your pharmacy for you to pick up at your convenience: Uceris; take 9 mg daily for 6 weeks.  Follow up with Dr. Norman Herrlich in office in 6 weeks                                               We are excited to introduce MyChart, a new best-in-class service that provides you online access to important information in your electronic medical record. We want to make it easier for you to view your health information - all in one secure location - when and where you need it. We expect MyChart will enhance the quality of care and service we provide.  When you register for MyChart, you can:    View your test results.    Request appointments and receive appointment reminders via email.    Request medication renewals.    View your medical history, allergies, medications and immunizations.    Communicate with your physician's office through a password-protected site.    Conveniently print information such as your medication lists.  To find out if MyChart is right for you, please talk to a member of our clinical staff today. We will gladly answer your questions about this free health and wellness tool.  If you are age 55 or older and want a member of your family to have access to your record, you must provide written consent by completing a proxy form available at our office. Please speak to our clinical staff about guidelines regarding accounts for patients younger than age 55.  As you activate your MyChart account and need any technical assistance, please call the MyChart technical support line at [phone]) or email your question to [email or web address]. If you email your question(s), please include your name, a return phone number and the best time to reach you.  If you have non-urgent health-related questions, you can send a message to our office through [place] at [email or web address]. If you  have a medical emergency, call 911.  Thank you for using MyChart as your new health and wellness resource!   MyChart licensed from [employer],  1999-2010. Patents Pending.

## 2012-08-30 NOTE — Progress Notes (Signed)
  Subjective:    Patient ID: [name], female    DOB: 06-14-1955, 57 y.o.   MRN: [id number]  HPI [name] is a 57 year old female with a past medical history of migraines, mild asthma, and remote colitis diagnosed in [place] approximately 15 years ago who is seen in followup.  She recently had a screening colonoscopy on 07/07/2012 which revealed a normal terminal ileum and mild aphthous ulcerations throughout the colon from cecum to rectum. Apices were consistent with chronic active colitis and IBD. After her last visit she started Lilada 4.8 g daily.  She has been on this medication for 6 weeks now and reports a decrease in bowel frequency now having around 2 bowel movements a day but they have remained loose. She also reports occasional blood per rectum but mainly on the toilet tissue. She does continue to see mucus in her stools She has intermittent abdominal discomfort which is worse after eating. It tends to be worse after eating red meat. It is not a daily problem. She has been having more frequent migraines and is also dealing with right knee pain due to osteoarthritis. She is undergoing Synvisc injections.  She is using some Aleve both for her knee pain and migraines.  No nausea or vomiting. No fevers or chills.   Review of Systems As per HPI, otherwise negative  Current Medications, Allergies, Past Medical History, Past Surgical History, Family History and Social History were reviewed in [employer] record.     Objective:   Physical Exam BP 112/78  Pulse 76  Ht 5' 1"  (1.549 m)  Wt 212 lb 12.8 oz (96.525 kg)  BMI 40.23 kg/m2 Constitutional: Well-developed and well-nourished. No distress. HEENT: Normocephalic and atraumatic.  No scleral icterus. Abdominal: Soft,  mild lower abdominal tenderness without rebound or guarding, nondistended. Bowel sounds active throughout. Extremities: no clubbing, cyanosis, or edema Neurological: Alert and oriented to person  place and time. Skin: Skin is warm and dry. No rashes noted. Psychiatric: Normal mood and affect. Behavior is normal.     Assessment & Plan:  57 year old female with a past medical history of migraines, mild asthma, and panulcerative colitis (dx 15 yrs ago) who is seen in followup  1.  Pan-UC -- it seems that the mesalamine 4.8 g daily has decreased the frequency of her stools however her stools are still loose. It also sounds as she is having some perianal irritation likely from wiping.  Given the small amount of bleeding and mucus that she is seeing in her stools and the fact that they have not become formed, my suspicion is that she still has active disease. We discussed management and I would like for her to continue on the Lialda 4.8 g daily, and we will add Uceris 9 mg daily to try and further achieve remission.  Hopefully with the Uceris we will achieve remission and the 5-ASA preparation can keep her there.  I would like for her to limit the use of Aleve and other NSAIDs whenever possible.  She voices understanding. I have recommended she use over-the-counter Balmex after each bowel movement to help with perianal irritation  I would like to see her back in 6 weeks to assess her response to colonic budesonide.  Health maintenance in IBD -- previous he discuss vaccination and she declines. No prior prednisone or corticosteroid exposure to drive repeat bone density. Bone density also performed recently and was normal by the [facility] criteria

## 2012-09-01 ENCOUNTER — Encounter: Payer: Self-pay | Admitting: Nurse Practitioner

## 2012-09-01 ENCOUNTER — Ambulatory Visit (INDEPENDENT_AMBULATORY_CARE_PROVIDER_SITE_OTHER): Payer: BC Managed Care – PPO | Admitting: Nurse Practitioner

## 2012-09-01 ENCOUNTER — Ambulatory Visit: Payer: BC Managed Care – PPO | Admitting: Nurse Practitioner

## 2012-09-01 VITALS — BP 143/83 | HR 71 | Ht 61.0 in | Wt 211.5 lb

## 2012-09-01 DIAGNOSIS — R400 Somnolence: Secondary | ICD-10-CM | POA: Insufficient documentation

## 2012-09-01 DIAGNOSIS — G43909 Migraine, unspecified, not intractable, without status migrainosus: Secondary | ICD-10-CM

## 2012-09-01 DIAGNOSIS — R404 Transient alteration of awareness: Secondary | ICD-10-CM

## 2012-09-01 NOTE — Telephone Encounter (Signed)
Done patient seen 09/01/2012.

## 2012-09-01 NOTE — Patient Instructions (Addendum)
ESS score elevated at 14, BMI 39.96 Will get sleep study Continue magnesium to take with food Continue Maxalt acutely Keep a record of your headaches and bring to your followup visit for migraine Followup after sleep study

## 2012-09-01 NOTE — Telephone Encounter (Signed)
Patient was seen in the office 09-01-2012. Done

## 2012-09-01 NOTE — Progress Notes (Signed)
HPI: Patient returns for followup after initial visit with Dr. Krista Blue 06/02/2012 her migraines. Patient has had migraines since the age of 34. She has noticed over the last 6 months that her migraines have increased in frequency and severity. She has not kept a record of her migraines, she has migraines sometimes in the afternoon, she has migraines sometimes in the morning upon awakening. She says her children say that she snores. She has never had sleep study. She has daytime drowsiness. She stopped taking riboflavin do to diarrhea however she was not taking the medication with food. Maxalt works acutely. She has a history of ulcerative colitis.  ESS 14.  ROS:  Headache, insomnia, diarrhea  Physical Exam General: well developed, obese female  seated, in no evident distress Head: head normocephalic and atraumatic. Oropharynx benign Neck: supple with no carotid  bruits Cardiovascular: regular rate and rhythm, no murmurs  Neurologic Exam Mental Status: Awake and fully alert. Oriented to place and time. Follows all commands. Speech and language normal.  ESS 14, neck 14", BMI 39.96 Cranial Nerves:  Pupils equal, briskly reactive to light. Extraocular movements full without nystagmus. Visual fields full to confrontation. Hearing intact and symmetric to finger snap. Facial sensation intact. Face, tongue, palate move normally and symmetrically. Neck flexion and extension normal.  Motor: Normal bulk and tone. Normal strength in all tested extremity muscles.No focal weakness Sensory.: intact to touch and pinprick and vibratory.  Coordination: Rapid alternating movements normal in all extremities. Finger-to-nose and heel-to-shin performed accurately bilaterally. Gait and Station: Arises from chair without difficulty. Stance is normal. Gait demonstrates normal stride length and balance . Able to heel, toe and tandem walk without difficulty.  Reflexes: 2+ and symmetric. Toes downgoing.     ASSESSMENT:  Long-standing history of migraine headaches, Maxalt works acutely, some headaches on awakening, ESS 14, BMI 39.96, neck 16 inches History of ulcerative colitis and arthritis, recent injection to her knee on the right    PLAN: Will get sleep study Continue magnesium to take with food Continue Maxalt acutely Keep a record of your headaches and bring to your followup visit for migraine Followup after sleep study  Dennie Bible, GNP-BC APRN

## 2012-09-04 ENCOUNTER — Telehealth: Payer: Self-pay | Admitting: Neurology

## 2012-09-04 DIAGNOSIS — G4733 Obstructive sleep apnea (adult) (pediatric): Secondary | ICD-10-CM

## 2012-09-04 NOTE — Telephone Encounter (Signed)
. °  Cecille Rubin, NP is referring [name], 57 y.o. y/o female, for the evaluation of sleep apnea.  Wt: 211 lbs. Ht: 61 in. BMI: 39.98  Diagnoses: Excessive Daytime Sleepiness Witnessed Apneas Migraine Snoring Insomnia Morbid Obesity Fatigue Nocturia     Medication List: Current Outpatient Prescriptions  Medication Sig Dispense Refill   acetaminophen (TYLENOL) 325 MG tablet Take 650 mg by mouth every 6 (six) hours as needed for pain.       Budesonide (UCERIS) 9 MG TB24 Take 9 mg by mouth daily.  60 tablet  0   HYLAN IX Inject into the articular space. Synvisc injection x 3 , patient has had 2       Magnesium Oxide 400 (240 MG) MG TABS Take 400 mg by mouth 2 (two) times daily.  60 tablet  12   mesalamine (LIALDA) 1.2 G EC tablet Take 4 tablets (4.8 g total) by mouth daily.  120 tablet  1   Naproxen Sodium (ALEVE PO) Take 1 tablet by mouth as needed (3-4 x week).        ondansetron (ZOFRAN-ODT) 4 MG disintegrating tablet Take 1 tablet (4 mg total) by mouth every 8 (eight) hours as needed for nausea.  20 tablet  0   Riboflavin 100 MG CAPS Take 1 capsule (100 mg total) by mouth 2 (two) times daily.  60 capsule  12   rizatriptan (MAXALT) 10 MG tablet Take 1 tablet (10 mg total) by mouth as needed for migraine. May repeat in 2 hours if needed  10 tablet  6   No current facility-administered medications for this visit.   This patient presents to Cecille Rubin, NP for a follow up visit regarding migraines.  Pt awakens in the morning with headache, is chronically fatigued during the daytime with excessive daytime sleepiness.  She endorses [place] at 14.  Pt reports snoring and upon questioning whether she awakens from sleep suddenly choking/gasping, she states that she is unsure but does report awakening often and suddenly panicked, having to catch her breath.  Advised the patient that she may be waking this way because she is not breathing - possible apneic events.  She also  complains of nocturia and non-restorative sleep.   Insurance:  [employer]

## 2012-09-05 NOTE — Telephone Encounter (Signed)
Split night study ordered, thx s

## 2012-09-06 ENCOUNTER — Telehealth: Payer: Self-pay | Admitting: Internal Medicine

## 2012-09-06 NOTE — Telephone Encounter (Signed)
Headache is possible for budesonide, but she was having headaches before. Would try budesonide another week before we make a decision about its efficacy Balmex cream after each bowel movement Have her call us in one week with another update

## 2012-09-06 NOTE — Telephone Encounter (Signed)
Informed pt of Dr Paula Libra orders/recommendations. She agreed to try the Uceris a while longer and will call with an update. She will call for worsening as well.

## 2012-09-06 NOTE — Telephone Encounter (Signed)
Pt seen 08/30/12 for loose, bloody stools; hx of UC. She was to remain on Lialda 4.8 G daily and Uceris 9 mg/day was added. Today pt reports she is still having 4-5 loose stools a day and she sees blood on the toilet paper when she wipes occasionally, although her bottom is very irritated. The most troubling thing has been migraines; she's had one constantly she can't get rid of it.  Handout on Uceris lists headache as one of the most common reactions. Please advise. Thanks.

## 2012-10-05 ENCOUNTER — Encounter: Payer: Self-pay | Admitting: Internal Medicine

## 2012-10-11 ENCOUNTER — Other Ambulatory Visit (INDEPENDENT_AMBULATORY_CARE_PROVIDER_SITE_OTHER): Payer: BC Managed Care – PPO

## 2012-10-11 ENCOUNTER — Ambulatory Visit (INDEPENDENT_AMBULATORY_CARE_PROVIDER_SITE_OTHER)
Admission: RE | Admit: 2012-10-11 | Discharge: 2012-10-11 | Disposition: A | Discharge status: Home / Self Care | Payer: BC Managed Care – PPO | Source: Ambulatory Visit | Attending: Internal Medicine | Admitting: Internal Medicine

## 2012-10-11 ENCOUNTER — Other Ambulatory Visit: Payer: Self-pay | Admitting: Internal Medicine

## 2012-10-11 ENCOUNTER — Encounter: Payer: Self-pay | Admitting: Internal Medicine

## 2012-10-11 ENCOUNTER — Ambulatory Visit (INDEPENDENT_AMBULATORY_CARE_PROVIDER_SITE_OTHER): Payer: BC Managed Care – PPO | Admitting: Internal Medicine

## 2012-10-11 VITALS — BP 120/80 | HR 81 | Ht 61.0 in | Wt 214.8 lb

## 2012-10-11 DIAGNOSIS — K51918 Ulcerative colitis, unspecified with other complication: Secondary | ICD-10-CM

## 2012-10-11 DIAGNOSIS — K5180 Other ulcerative colitis without complications: Secondary | ICD-10-CM

## 2012-10-11 LAB — CBC
HCT: 39.2 % (ref 36.0–46.0)
Hemoglobin: 12.7 g/dL (ref 12.0–15.0)
MCHC: 32.3 g/dL (ref 30.0–36.0)
MCV: 69.5 fl — ABNORMAL LOW (ref 78.0–100.0)
RDW: 14.6 % (ref 11.5–14.6)

## 2012-10-11 LAB — HIGH SENSITIVITY CRP: CRP, High Sensitivity: 16.48 mg/L — ABNORMAL HIGH (ref 0.000–5.000)

## 2012-10-11 LAB — BASIC METABOLIC PANEL
Calcium: 9.2 mg/dL (ref 8.4–10.5)
Chloride: 107 mEq/L (ref 96–112)
Creatinine, Ser: 0.6 mg/dL (ref 0.4–1.2)
GFR: 126.25 mL/min (ref >60.00)

## 2012-10-11 MED ORDER — MESALAMINE 1.2 G PO TBEC: 1200.0000 mg | DELAYED_RELEASE_TABLET | Freq: Every day | ORAL | Status: DC

## 2012-10-11 MED ORDER — PREDNISONE 10 MG PO TABS: 10.0000 mg | ORAL_TABLET | Freq: Every day | ORAL | Status: DC

## 2012-10-11 NOTE — Patient Instructions (Addendum)
Your physician has requested that you go to the basement for the following lab work before leaving today: CBC, Bmet, CRP  Discontinue taking Uceris  We have sent the following medications to your pharmacy for you to pick up at your convenience: Lialda, take 1 tablet daily. Prednisone take 30 mg for ten days then decrease by 5 mg every seven days until finished  Follow up with Dr. Hilarie Fredrickson in office in 5-6 weeks                                               We are excited to introduce MyChart, a new best-in-class service that provides you online access to important information in your electronic medical record. We want to make it easier for you to view your health information - all in one secure location - when and where you need it. We expect MyChart will enhance the quality of care and service we provide.  When you register for MyChart, you can:    View your test results.    Request appointments and receive appointment reminders via email.    Request medication renewals.    View your medical history, allergies, medications and immunizations.    Communicate with your physician's office through a password-protected site.    Conveniently print information such as your medication lists.  To find out if MyChart is right for you, please talk to a member of our clinical staff today. We will gladly answer your questions about this free health and wellness tool.  If you are age 19 or older and want a member of your family to have access to your record, you must provide written consent by completing a proxy form available at our office. Please speak to our clinical staff about guidelines regarding accounts for patients younger than age 18.  As you activate your MyChart account and need any technical assistance, please call the MyChart technical support line at [phone]) or email your question to [email or web address]. If you email your question(s), please include your name,  a return phone number and the best time to reach you.  If you have non-urgent health-related questions, you can send a message to our office through [place] at [email or web address]. If you have a medical emergency, call 911.  Thank you for using MyChart as your new health and wellness resource!   MyChart licensed from [employer],  1999-2010. Patents Pending.

## 2012-10-11 NOTE — Progress Notes (Signed)
Subjective:    Patient ID: [name], female    DOB: [birth year], 57 y.o.   MRN: [id number]  HPI [name] is a 57 year old female with a past medical history of migraines, mild asthma, and panel shows colitis who is seen in followup.  She recently had a screening colonoscopy on 07/07/2012 which revealed a normal terminal ileum and mild aphthous ulcerations throughout the colon from cecum to rectum. Biopsies were consistent with chronic active colitis and IBD. She was started only out of 4.8 g daily and after her last visit due to continued loose stools with intermittent abdominal discomfort, she was started on colonic budesonide 9 mg daily. She returns 6 weeks later. She reports no improvement in her bowel frequency. She is still having loose stools 3-5 times daily. She often has a least one nocturnal stool. Appetite is "so-so". No nausea or vomiting. No significant heartburn. She tried stopping her magnesium, which she has been using to help control migraines, but saw no decrease in her stool frequency. She is occasionally seeing red blood mixed with her stools. She is using less Aleve though very occasionally has to use it to abort a migraine. She reports intermittent abdominal discomfort but not significant abdominal pain. No fevers or chills. She is being seen by [facility] for her migraines, and they continue to be an issue for her.  Review of Systems As per history of present illness, otherwise negative  Current Medications, Allergies, Past Medical History, Past Surgical History, Family History and Social History were reviewed in [employer] record.     Objective:   Physical Exam BP 120/80  Pulse 81  Ht 5' 1"  (1.549 m)  Wt 214 lb 12.8 oz (97.433 kg)  BMI 40.61 kg/m2  SpO2 97% Constitutional: Well-developed and well-nourished. No distress. HEENT: Normocephalic and atraumatic. Oropharynx is clear and moist. No oropharyngeal exudate. Conjunctivae are  normal.  No scleral icterus. Neck: Neck supple. Trachea midline. Cardiovascular: Normal rate, regular rhythm and intact distal pulses. No M/R/G Pulmonary/chest: Effort normal and breath sounds normal. No wheezing, rales or rhonchi. Abdominal: Soft, diffuse tenderness without rebound or guarding, nondistended. Bowel sounds active throughout. Extremities: no clubbing, cyanosis, or edema Lymphadenopathy: No cervical adenopathy noted. Neurological: Alert and oriented to person place and time. Skin: Skin is warm and dry. No rashes noted. Psychiatric: Normal mood and affect. Behavior is normal.  Labs today Abd xray today, reviewed, nonspecific bowel gas pattern without overt dilation or evidence of obstruction     Assessment & Plan:  57 year old female with a past medical history of migraines, mild asthma, and panel shows colitis who is seen in followup.  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 She did not improve significantly with colonic budesonide, nor with Lialda. She will stop colonic budesonide. We also discussed how occasionally Lialda or other mesalamine products can cause worsening of loose stools. With this in mind I will decrease her dose to 2.4 g daily to see if this makes a big difference in bowel frequency.  I also will prescribe a prednisone taper to try to capture remission. We'll start with prednisone 30 mg x10 days and then decrease by 5 mg every 7 days until off. I would like to see her just as she is completing a prednisone taper to assess her response. If symptoms fail to improve my recommendation would be for flexible sigmoidoscopy to evaluate the  activity of her colitis. She understands and is happy with this plan. She will continue to avoid Aleve and other NSAIDs as much is possible --CBC, BMET and CRP today

## 2012-11-13 ENCOUNTER — Encounter: Payer: Self-pay | Admitting: Internal Medicine

## 2012-11-22 ENCOUNTER — Encounter: Payer: Self-pay | Admitting: Internal Medicine

## 2012-11-22 ENCOUNTER — Ambulatory Visit (INDEPENDENT_AMBULATORY_CARE_PROVIDER_SITE_OTHER): Payer: BC Managed Care – PPO | Admitting: Internal Medicine

## 2012-11-22 ENCOUNTER — Other Ambulatory Visit (INDEPENDENT_AMBULATORY_CARE_PROVIDER_SITE_OTHER): Payer: BC Managed Care – PPO

## 2012-11-22 ENCOUNTER — Telehealth: Payer: Self-pay | Admitting: Internal Medicine

## 2012-11-22 VITALS — BP 100/70 | HR 80 | Ht 62.0 in | Wt 216.2 lb

## 2012-11-22 DIAGNOSIS — K51918 Ulcerative colitis, unspecified with other complication: Secondary | ICD-10-CM

## 2012-11-22 DIAGNOSIS — D509 Iron deficiency anemia, unspecified: Secondary | ICD-10-CM

## 2012-11-22 DIAGNOSIS — K5180 Other ulcerative colitis without complications: Secondary | ICD-10-CM

## 2012-11-22 LAB — CBC
Hemoglobin: 12.9 g/dL (ref 12.0–15.0)
MCHC: 32.3 g/dL (ref 30.0–36.0)
MCV: 69.2 fl — ABNORMAL LOW (ref 78.0–100.0)
Platelets: 336 10*3/uL (ref 150.0–400.0)
RBC: 5.78 Mil/uL — ABNORMAL HIGH (ref 3.87–5.11)

## 2012-11-22 LAB — COMPREHENSIVE METABOLIC PANEL
AST: 22 U/L (ref 0–37)
Albumin: 3.5 g/dL (ref 3.5–5.2)
Alkaline Phosphatase: 63 U/L (ref 39–117)
BUN: 11 mg/dL (ref 6–23)
Calcium: 9.3 mg/dL (ref 8.4–10.5)
Creatinine, Ser: 0.8 mg/dL (ref 0.4–1.2)
Glucose, Bld: 79 mg/dL (ref 70–99)
Potassium: 4.3 mEq/L (ref 3.5–5.1)

## 2012-11-22 LAB — IBC PANEL
Iron: 82 ug/dL (ref 42–145)
Transferrin: 269.4 mg/dL (ref 212.0–360.0)

## 2012-11-22 LAB — FERRITIN: Ferritin: 136.1 ng/mL (ref 10.0–291.0)

## 2012-11-22 LAB — HIGH SENSITIVITY CRP: CRP, High Sensitivity: 16.9 mg/L — ABNORMAL HIGH (ref 0.000–5.000)

## 2012-11-22 MED ORDER — MESALAMINE 1.2 G PO TBEC: 1200.0000 mg | DELAYED_RELEASE_TABLET | Freq: Every day | ORAL | Status: DC

## 2012-11-22 MED ORDER — PREDNISONE 10 MG PO TABS: 10.0000 mg | ORAL_TABLET | Freq: Every day | ORAL | Status: DC

## 2012-11-22 NOTE — Progress Notes (Signed)
Subjective:    Patient ID: [name], female    DOB: 01-Nov-1955, 57 y.o.   MRN: [id number]  HPI Mrs. 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 Her ulcerative colitis was diagnosed approximately 30 years ago during [employer] in [place]. She came for screening colonoscopy on 07/07/2012 which revealed a normal terminal ileum and aphthous ulcerations throughout the colon from cecum to rectum.  Biopsies were consistent with inflammatory bowel disease. She was treated with mesalamine 4.8 g daily and colonic budesonide. She had very little response to this medication with continued loose stools, blood per rectum and abdominal pain. On her last visit in August a prednisone taper was started. She has tapered down to 5 mg daily to this point Her mesalamine was reduced to 2.4 g daily to see if this helped with loose stools. She returns today stating significant improvement in her abdominal symptoms. She denies abdominal pain. Her stools have lessened and formed. She is now having one to 2 stools per day and wonders "is that enough". No further rectal bleeding or blood in her stool. No further nocturnal stooling. No fevers or chills. No mouth ulcers. She does report some right lower back pain which she felt on standing from a seated position at work. This has been over the last week. There is no muscle weakness. No urinary symptoms. She is using Aleve on a fairly regular basis for her ongoing history of migraines.  Review of Systems As per history of present illness, otherwise negative  Current Medications, Allergies, Past Medical History, Past Surgical History, Family History and Social History were reviewed in [employer] record.     Objective:   Physical Exam BP 100/70  Pulse 80  Ht 5' 2"  (1.575 m)  Wt 216 lb 4 oz (98.09 kg)  BMI 39.54 kg/m2 Constitutional: Well-developed  and well-nourished. No distress. HEENT: Normocephalic and atraumatic. Oropharynx is clear and moist. No oropharyngeal exudate. Conjunctivae are normal.  No scleral icterus. Cardiovascular: Normal rate, regular rhythm and intact distal pulses.  Abdominal: Soft, nontender, nondistended. Bowel sounds active throughout.  Extremities: no clubbing, cyanosis, or edema Neurological: Alert and oriented to person place and time. Psychiatric: Normal mood and affect. Behavior is normal.  CBC    Component Value Date/Time   WBC 7.9 10/11/2012 0925   RBC 5.64* 10/11/2012 0925   HGB 12.7 10/11/2012 0925   HCT 39.2 10/11/2012 0925   PLT 312.0 10/11/2012 0925   MCV 69.5 Repeated and verified X2.* 10/11/2012 0925   MCH 22.2* 05/19/2012 1628   MCHC 32.3 10/11/2012 0925   RDW 14.6 10/11/2012 0925   LYMPHSABS 3.0 05/19/2012 1628   MONOABS 0.4 05/19/2012 1628   EOSABS 0.1 05/19/2012 1628   BASOSABS 0.0 05/19/2012 1628    C-reactive protein 16    CMP     Component Value Date/Time   NA 141 10/11/2012 0925   K 3.7 10/11/2012 0925   CL 107 10/11/2012 0925   CO2 30 10/11/2012 0925   GLUCOSE 82 10/11/2012 0925   BUN 10 10/11/2012 0925   CREATININE 0.6 10/11/2012 0925   CREATININE 0.73 05/19/2012 1628   CALCIUM 9.2 10/11/2012 0925   PROT 7.8 05/19/2012 1628   ALBUMIN 4.3 05/19/2012 1628   AST 15 05/19/2012 1628   ALT 14 05/19/2012 1628   ALKPHOS 85 05/19/2012 1628   BILITOT 0.5 05/19/2012 1628   GFRNONAA 94.13 12/31/2009 1529  Assessment & Plan:  58 year old female with a past medical history of migraines, mild asthma, and 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  1.  Pan-UC -- we have been able to capture remission clinically with prednisone taper. She has tapered down to 5 mg daily. She continues on mesalamine 2.4 g daily. We have discussed the natural history of this disease, and I am uncertain now that we have achieved clinical remission if we will be able to maintain this with 5-ASA alone.  We have discussed the  next step in maintenance, should this be necessary would be azathioprine or an injectable Biologic medicine.  We discussed maintenance therapy including immunomodulators and biologics.  We also discussed the risks in great detail including the risk of infection (including reactivation of latent TB and underlying viral hepatitis), hepatotoxicity, leukopenia, pancreatitis, nausea, malignancy (specifically lymphoma), demyelinating disease, and even heart failure. At this time she is hesitant to start additional maintenance medication, and I am okay with this for now. I would like her to continue mesalamine 2.4 g daily, and limit her NSAIDs. I will see her back in 3-4 months to reassess her symptoms. She is asked to notify me immediately should she have return of her diarrhea, abdominal pain, or bloody stools. She voices understanding. We have discussed how should her ulcerative colitis flare, she would need escalation of therapy and she understands this recommendation. CMP, CBC, CRP, and TPMT today.  She was given a handout comparing immunomodulators and biologic therapy for her review.  2.  Microcytic anemia, likely iron deficient -- I will formally check iron studies today and have recommended daily iron replacement. She was given samples of Fusion Plus to use once daily. If she tolerates this well, we can prescribe it for her. I would like for her to remain on oral iron replacement until followup

## 2012-11-22 NOTE — Patient Instructions (Signed)
Your physician has requested that you go to the basement for  lab work before leaving today.  We have sent the following medications to your pharmacy for you to pick up at your convenience: Continue taking Lialda. Take prednisone daily for 5 days then stop.  You have been given samples of Fusion plus, if you like these please call us and we will send you in a prescription.  Please call our office if your diarrhea continues or if you are having abdominal pains.   Limit your use of NSAIDS  Follow up with Dr. Hilarie Fredrickson in office in 3-4 months   Nonsteroidal Anti-Inflammatory Medications  Nonsteroidal anti-inflammatory medications (NSAIDs) are a group of medicines often used for relief of pain and inflammation. These drugs include ibuprofen, aspirin, and naproxen. They are widely available in an over-the-counter form. The mechanism by which these drugs work in the body is not clearly understood. NSAIDs have many effects on the body, including pain relief, anti-inflammation, fever reduction, and reducing the blood's ability to clot. Most NSAIDs are taken orally in tablet form. Some may also be taken by injection in a vein (intravenously). WHY ATHLETES USE IT Many athletes use NSAIDs for their anti-inflammatory and pain reducing (analgesic) properties. Athletic participation frequently causes aches, pains, and inflammation, which these drugs can treat. There is also some evidence that they speed recovery after injury.  ADVERSE EFFECTS   Nausea.  Stomach pain.  Bleeding from the stomach and intestines.  Inflammation of the kidneys (nephritis).  Inflammation of the liver (hepatitis).  Headache.  Ringing in the ears (tinnitus).  Rash, with sun exposure (photosensitivity).  Increase in fluid volume (fluid retention).  Ulcers of the stomach and small intestine.  Kidney failure.  Liver failure.  Poor control of asthma.  Itching (urticaria).  Increase in nasal polyps  (swelling).  Depression.  Loss of red blood cells (anemia).  Loose stools (diarrhea). PHARMACOLOGY  NSAIDs exist in both short-acting and long-acting forms. Many users of NSAIDs experience pain relief with initial doses, that becomes less effective with continual use. Most caregivers believe NSAIDs should be used for 2 to 3 weeks, before they are considered ineffective. Most NSAIDs are excreted from the body through the kidneys. The use of NSAIDs under conditions where dehydration can occur increases the risk of side effects to the kidneys and the liver. This is especially common in older athletes and in athletes not acclimated to the heat. All these drugs are well absorbed when taken by mouth. The price of these drugs is variable. PREVENTION  It is recommend that NSAIDs be used after athletic participation to help recovery, or in the early stages of injury treatment. This is the time when athletes are trying to control pain and inflammation. Many athletes choose to take NSAIDs prophylactically (preventative, before injury). However, this may be associated with an increased risk of side effects. If you experience any side effects, including a decrease in performance while taking NSAIDs, discontinue use and consult your caregiver. If you choose to use NSAIDs regularly. for longer than 3 to 6 months, you should obtain screening blood tests for the liver, kidney, and bone marrow. Ongoing use may also increase your risk of a stomach ulcer. Document Released: 02/01/2005 Document Revised: 04/26/2011 Document Reviewed: 05/16/2008 [facility] Patient Information 2014 [place], [place].  We are excited to introduce MyChart, a new best-in-class service that provides you online access to important information in your electronic medical record. We want to make it easier for you to view your health information - all in one secure location - when and where you need it. We  expect MyChart will enhance the quality of care and service we provide.  When you register for MyChart, you can:    View your test results.    Request appointments and receive appointment reminders via email.    Request medication renewals.    View your medical history, allergies, medications and immunizations.    Communicate with your physician's office through a password-protected site.    Conveniently print information such as your medication lists.  To find out if MyChart is right for you, please talk to a member of our clinical staff today. We will gladly answer your questions about this free health and wellness tool.  If you are age 55 or older and want a member of your family to have access to your record, you must provide written consent by completing a proxy form available at our office. Please speak to our clinical staff about guidelines regarding accounts for patients younger than age 71.  As you activate your MyChart account and need any technical assistance, please call the MyChart technical support line at [phone]) or email your question to [email or web address]. If you email your question(s), please include your name, a return phone number and the best time to reach you.  If you have non-urgent health-related questions, you can send a message to our office through [place] at [email or web address]. If you have a medical emergency, call 911.  Thank you for using MyChart as your new health and wellness resource!   MyChart licensed from [employer],  1999-2010. Patents Pending.

## 2012-12-01 ENCOUNTER — Ambulatory Visit: Payer: BC Managed Care – PPO | Admitting: Nurse Practitioner

## 2012-12-20 ENCOUNTER — Encounter: Payer: Self-pay | Admitting: Family Medicine

## 2012-12-20 ENCOUNTER — Ambulatory Visit (INDEPENDENT_AMBULATORY_CARE_PROVIDER_SITE_OTHER): Payer: BC Managed Care – PPO | Admitting: Family Medicine

## 2012-12-20 VITALS — BP 128/88 | HR 99 | Temp 98.1°F | Resp 16 | Wt 220.5 lb

## 2012-12-20 DIAGNOSIS — R109 Unspecified abdominal pain: Secondary | ICD-10-CM

## 2012-12-20 LAB — POCT URINALYSIS DIPSTICK
Bilirubin, UA: NEGATIVE
Blood, UA: NEGATIVE
Glucose, UA: NEGATIVE
Spec Grav, UA: 1.02
pH, UA: 6

## 2012-12-20 MED ORDER — TRAMADOL HCL 50 MG PO TABS: 50.0000 mg | ORAL_TABLET | Freq: Three times a day (TID) | ORAL | Status: DC | PRN

## 2012-12-20 MED ORDER — CYCLOBENZAPRINE HCL 10 MG PO TABS: 10.0000 mg | ORAL_TABLET | Freq: Three times a day (TID) | ORAL | Status: DC | PRN

## 2012-12-20 NOTE — Assessment & Plan Note (Signed)
New.  No evidence of UTI or kidney stone.  Pt's sxs are a mixed picture- painful w/ very light touch which would be consistent w/ shingles but no rash present after 3 weeks.  + radicular pain w/ perceived weakness but no pain w/ back flexion/extension.  sxs did not improve w/ prolonged course of prednisone.  Start flexeril and tramadol.  If no improvement, will refer to ortho.  Reviewed supportive care and red flags that should prompt return.  Pt expressed understanding and is in agreement w/ plan.

## 2012-12-20 NOTE — Progress Notes (Signed)
  Subjective:    Patient ID: [name], female    DOB: Oct 16, 1955, 57 y.o.   MRN: [id number]  HPI R flank pain- pain is posterior R flank radiating into buttock.  sxs started 1 month ago.  No known injury.  No change in activity level.  No new mattress or sleeping arrangement.  Has associated 'burning feeling' down into R leg.  Some sensation of weakness.  Pain is constant.  Some improvement w/ heat.  No relief w/ Aleve.  Worse w/ movement and prolonged standing.  Recently completed course of prednisone for UC.   Review of Systems For ROS see HPI     Objective:   Physical Exam  Vitals reviewed. Constitutional: She is oriented to person, place, and time. She appears well-developed and well-nourished. No distress.  Cardiovascular: Intact distal pulses.   Musculoskeletal:  (-) SLR on L, + SLR on R TTP w/ very light touch over R posterior flank, lumbar spine, and buttock Good flexion and extension of spine  Neurological: She is alert and oriented to person, place, and time. She has normal reflexes.  Skin: Skin is warm and dry. No rash noted. No erythema.          Assessment & Plan:

## 2012-12-20 NOTE — Patient Instructions (Signed)
Follow up as needed- call by Monday if no improvement Start the Tramadol as needed for pain- make sure you can take this and still function at work Use the flexeril before bed HEAT! Call with any questions or concerns Hang in there!

## 2012-12-21 ENCOUNTER — Other Ambulatory Visit: Payer: Self-pay

## 2012-12-21 NOTE — Telephone Encounter (Signed)
Pt was seen on 11-22-12

## 2012-12-25 ENCOUNTER — Encounter: Payer: Self-pay | Admitting: Nurse Practitioner

## 2012-12-25 ENCOUNTER — Telehealth: Payer: Self-pay | Admitting: Family Medicine

## 2012-12-25 ENCOUNTER — Ambulatory Visit (INDEPENDENT_AMBULATORY_CARE_PROVIDER_SITE_OTHER): Payer: BC Managed Care – PPO | Admitting: Nurse Practitioner

## 2012-12-25 ENCOUNTER — Other Ambulatory Visit: Payer: Self-pay | Admitting: *Deleted

## 2012-12-25 VITALS — BP 123/80 | HR 90 | Ht 63.5 in | Wt 220.0 lb

## 2012-12-25 DIAGNOSIS — G4733 Obstructive sleep apnea (adult) (pediatric): Secondary | ICD-10-CM

## 2012-12-25 DIAGNOSIS — G43909 Migraine, unspecified, not intractable, without status migrainosus: Secondary | ICD-10-CM

## 2012-12-25 NOTE — Progress Notes (Signed)
[facility]  PATIENT: [name] DOB: January 07, 1956   REASON FOR VISIT: Followup for migraines  HISTORY OF PRESENT ILLNESS: Ms. Bach, 57 year old black female returns for followup. She has had migraines since the age of 12. They are currently relieved with Maxalt and she is not only preventive. She has had a flare of colitis since last seen and has been on prednisone. She has also had a flare with her knees and had been injected. She also is complaining of musculoskeletal back pain which is being treated by her primary care .She has stopped her riboflavin and magnesium. She was evaluated for a sleep study however she cannot afford the co-pay. She will check into this on the way out today. He continues to complain of a lot of daytime drowsiness. Prior ESS was 14.   HISTORY: Patient has had migraines since the age of 32. She has noticed over the last 6 months that her migraines have increased in frequency and severity. She has not kept a record of her migraines, she has migraines sometimes in the afternoon, she has migraines sometimes in the morning upon awakening. She says her children say that she snores. She has never had sleep study. She has daytime drowsiness. She stopped taking riboflavin do to diarrhea however she was not taking the medication with food. Maxalt works acutely. She has a history of ulcerative colitis. ESS 14.       REVIEW OF SYSTEMS: Full 14 system review of systems performed and notable only for:  Constitutional: Weight gain, fatigue Cardiovascular: Swelling in legs Ear/Nose/Throat: N/A  Skin: N/A  Eyes: Blurred vision Respiratory: Shortness of breath, snoring  Gastroitestinal: N/A  Hematology/Lymphatic: N/A  Endocrine: N/A Musculoskeletal: Joint swelling  Allergy/Immunology: N/A  Neurological: N/A Psychiatric: Not enough sleep   ALLERGIES: No Known Allergies  HOME MEDICATIONS: Outpatient Prescriptions Prior to Visit  Medication  Sig Dispense Refill  . acetaminophen (TYLENOL) 325 MG tablet Take 650 mg by mouth every 6 (six) hours as needed for pain.      . cyclobenzaprine (FLEXERIL) 10 MG tablet Take 1 tablet (10 mg total) by mouth 3 (three) times daily as needed for muscle spasms.  45 tablet  0  . mesalamine (LIALDA) 1.2 G EC tablet Take 1 tablet (1.2 g total) by mouth daily.  90 tablet  3  . Naproxen Sodium (ALEVE PO) Take 1 tablet by mouth as needed (3-4 x week).       . Riboflavin 100 MG CAPS Take 1 capsule (100 mg total) by mouth 2 (two) times daily.  60 capsule  12  . rizatriptan (MAXALT) 10 MG tablet Take 1 tablet (10 mg total) by mouth as needed for migraine. May repeat in 2 hours if needed  10 tablet  6  . traMADol (ULTRAM) 50 MG tablet Take 1 tablet (50 mg total) by mouth every 8 (eight) hours as needed.  45 tablet  0   No facility-administered medications prior to visit.    PAST MEDICAL HISTORY: Past Medical History  Diagnosis Date  . Migraine   . Arthritis   . UC (ulcerative colitis) 06/2012    Colonoscopy with Dr. Hilarie Fredrickson    PAST SURGICAL HISTORY: Past Surgical History  Procedure Laterality Date  . Abdominal hysterectomy      partial  . Knee surgery  12-13,11-10  . Breast surgery    . Colonoscopy  20 + years ago    In [place] [facility]    FAMILY HISTORY: Family History  Problem  Relation Age of Onset  . Diabetes Brother   . Diabetes Daughter   . Sickle cell trait Mother   . Colon cancer Neg Hx   . Asthma      SOCIAL HISTORY: History   Social History  . Marital Status: Divorced    Spouse Name: N/A    Number of Children: 3  . Years of Education: college   Occupational History  .      Computers   Social History Main Topics  . Smoking status: Never Smoker   . Smokeless tobacco: Never Used  . Alcohol Use: No  . Drug Use: No  . Sexual Activity: Not on file   Other Topics Concern  . Not on file   Social History Narrative   Patient lives at home alone and she is divorced.  Patient works with computers. Patient has a college education.   Right handed.   Caffeine.- one cup daily.       PHYSICAL EXAM  Filed Vitals:   12/25/12 0907  BP: 123/80  Pulse: 90  Height: 5' 3.5" (1.613 m)  Weight: 220 lb (99.791 kg)   Body mass index is 38.36 kg/(m^2).  Generalized: Well developed, obese female in no acute distress  Head: normocephalic and atraumatic,. Oropharynx benign  Neck: Supple, no carotid bruits  Cardiac: Regular rate rhythm, no murmur  Musculoskeletal: No deformity   Neurological examination   Mentation: Alert oriented to time, place, history taking. Follows all commands speech and language fluent  Cranial nerve II-XII: Pupils were equal round reactive to light extraocular movements were full, visual field were full on confrontational test. Facial sensation and strength were normal. hearing was intact to finger rubbing bilaterally. Uvula tongue midline. head turning and shoulder shrug and were normal and symmetric.Tongue protrusion into cheek strength was normal. Motor: normal bulk and tone, full strength in the BUE, BLE, fine finger movements normal, no pronator drift. No focal weakness Coordination: finger-nose-finger, heel-to-shin bilaterally, no dysmetria Reflexes: Brachioradialis 2/2, biceps 2/2, triceps 2/2, patellar 2/2, Achilles 2/2, plantar responses were flexor bilaterally. Gait and Station: Rising up from seated position without assistance, normal stance, moderate stride, good arm swing, smooth turning, able to perform tiptoe, and heel walking without difficulty. Tandem gait steady  DIAGNOSTIC DATA (LABS, IMAGING, TESTING) - I reviewed patient records, labs, notes, testing and imaging myself where available.  Lab Results  Component Value Date   WBC 7.9 11/22/2012   HGB 12.9 11/22/2012   HCT 40.0 11/22/2012   MCV 69.2 Repeated and verified X2.* 11/22/2012   PLT 336.0 11/22/2012      Component Value Date/Time   NA 145 11/22/2012 0939   K  4.3 11/22/2012 0939   CL 106 11/22/2012 0939   CO2 29 11/22/2012 0939   GLUCOSE 79 11/22/2012 0939   BUN 11 11/22/2012 0939   CREATININE 0.8 11/22/2012 0939   CREATININE 0.73 05/19/2012 1628   CALCIUM 9.3 11/22/2012 0939   PROT 7.4 11/22/2012 0939   ALBUMIN 3.5 11/22/2012 0939   AST 22 11/22/2012 0939   ALT 20 11/22/2012 0939   ALKPHOS 63 11/22/2012 0939   BILITOT 0.9 11/22/2012 0939   GFRNONAA 94.13 12/31/2009 1529   Lab Results  Component Value Date   CHOL 181 05/19/2012   HDL 68 05/19/2012   LDLCALC 99 05/19/2012   TRIG 72 05/19/2012   CHOLHDL 2.7 05/19/2012     Lab Results  Component Value Date   TSH 1.653 05/19/2012      ASSESSMENT AND  PLAN  57 y.o. year old female  has a past medical history of Migraine; Arthritis; and UC (ulcerative colitis) (06/2012). here to followup for headaches which are well controlled with Maxalt. She has not had a record of her migraines.  Continue Maxalt acutely for migraines Sleep lab will check with insurance and give you a call today sleep study ordered after last visit.  F/U for headaches in 6 months Dennie Bible, [facility], [facility], APRN  [facility] [address] [place], [place] [address] [phone]

## 2012-12-25 NOTE — Telephone Encounter (Signed)
Spoke with pt and advised. Told pt that if symptoms are still persistent she should let us know.

## 2012-12-25 NOTE — Telephone Encounter (Signed)
Pt only needs to take meds if pain is present.  Glad she is feeling better!

## 2012-12-25 NOTE — Patient Instructions (Signed)
Continue Maxalt acutely for migraines Sleep lab will check with insurance and give you a call today F/U for headaches in 6 months

## 2012-12-25 NOTE — Telephone Encounter (Signed)
Patient came in last week for a pain she has in her RT side. Says she was told to call today to let Dr. Birdie Riddle know how she was doing. She states that the pain is not as bad and wants to know if Dr. Birdie Riddle advises she continue the medications that were prescribed. Please advise.

## 2013-02-13 ENCOUNTER — Telehealth: Payer: Self-pay | Admitting: *Deleted

## 2013-02-13 DIAGNOSIS — Z0279 Encounter for issue of other medical certificate: Secondary | ICD-10-CM

## 2013-02-13 NOTE — Telephone Encounter (Signed)
02/13/2013 Copy sent to batch, copy sent to billing.  Pt has copy at front desk.  bw

## 2013-02-13 NOTE — Telephone Encounter (Signed)
Form completed and faxed to [facility]

## 2013-02-13 NOTE — Telephone Encounter (Signed)
02/13/2013  Pt dropped off Health Screening form to be completed and faxed to [place] [facility] (fax [phone]).  Attached billing form and put in Sandra's folder.  bw

## 2013-03-09 ENCOUNTER — Ambulatory Visit: Payer: BC Managed Care – PPO | Admitting: Family Medicine

## 2013-03-09 ENCOUNTER — Ambulatory Visit (INDEPENDENT_AMBULATORY_CARE_PROVIDER_SITE_OTHER): Payer: BC Managed Care – PPO | Admitting: Internal Medicine

## 2013-03-09 ENCOUNTER — Encounter: Payer: Self-pay | Admitting: Internal Medicine

## 2013-03-09 VITALS — BP 120/68 | HR 82 | Temp 98.2°F | Wt 210.0 lb

## 2013-03-09 DIAGNOSIS — B9789 Other viral agents as the cause of diseases classified elsewhere: Secondary | ICD-10-CM

## 2013-03-09 DIAGNOSIS — B349 Viral infection, unspecified: Secondary | ICD-10-CM

## 2013-03-09 MED ORDER — HYDROCODONE-HOMATROPINE 5-1.5 MG/5ML PO SYRP: 5.0000 mL | ORAL_SOLUTION | Freq: Every evening | ORAL | Status: DC | PRN

## 2013-03-09 MED ORDER — AMOXICILLIN 500 MG PO CAPS: 1000.0000 mg | ORAL_CAPSULE | Freq: Two times a day (BID) | ORAL | Status: DC

## 2013-03-09 NOTE — Progress Notes (Signed)
Pre visit review using our clinic review tool, if applicable. No additional management support is needed unless otherwise documented below in the visit note. 

## 2013-03-09 NOTE — Patient Instructions (Signed)
Rest, fluids , tylenol  For cough, take Mucinex DM twice a day as needed  If the cough is severe at night, take hydrocodone, will cause drowsiness. Do not mix hydrocodone with Ultram  For congestion use OTC Nasocort: 2 nasal sprays on each side of the nose daily until you feel better   Take the antibiotic as prescribed  (Amoxicillin) only if you are not improving in the next 3 or 4 days  Call if no better in few days Call anytime if the symptoms are severe

## 2013-03-09 NOTE — Progress Notes (Signed)
   Subjective:    Patient ID: [name], female    DOB: February 01, 1956, 58 y.o.   MRN: [id number]  HPI Acute visit Symptoms started a week ago: Sore throat, frontal headache, chest congestion, chills, cough, sneezing, "teeth aching". She admits that several people in her office are sick. Not taking any specific medication.   Past Medical History  Diagnosis Date  . Migraine   . Arthritis   . UC (ulcerative colitis) 06/2012    Colonoscopy with Dr. Hilarie Fredrickson   Past Surgical History  Procedure Laterality Date  . Abdominal hysterectomy      partial  . Knee surgery  12-13,11-10  . Breast surgery    . Colonoscopy  20 + years ago    In [place].   History  Substance Use Topics  . Smoking status: Never Smoker   . Smokeless tobacco: Never Used  . Alcohol Use: No    Review of Systems Has not checked her temperature but has + subjective fever. No sputum production, but does have nasal discharge. Had some nausea and a small amount of diarrhea but no vomiting.     Objective:   Physical Exam BP 120/68  Pulse 82  Temp(Src) 98.2 F (36.8 C)  Wt 210 lb (95.255 kg)  SpO2 99% General -- alert, well-developed, NAD. Not toxic appearing. HEENT-- Not pale. TMs normal, throat symmetric, no redness or discharge. Face symmetric but all sinuses are moderately  tender to palpation (maxillary and frontal). Nose congested.  Lungs -- normal respiratory effort, no intercostal retractions, no accessory muscle use, and normal breath sounds.  Heart-- normal rate, regular rhythm, no murmur.   Extremities-- no pretibial edema bilaterally  Neurologic--  alert & oriented X3. Speech normal, gait normal, strength normal in all extremities.  Psych-- Cognition and judgment appear intact. Cooperative with normal attention span and concentration. No anxious or depressed appearing.      Assessment & Plan:  Viral syndrome, Patient likely has a viral syndrome although  she may have early sinusitis as  well. Plan: Conservative treatment, if not better will start antibiotics.

## 2013-06-26 ENCOUNTER — Ambulatory Visit: Payer: BC Managed Care – PPO | Admitting: Nurse Practitioner

## 2013-07-16 ENCOUNTER — Encounter: Payer: Self-pay | Admitting: Family Medicine

## 2013-07-16 ENCOUNTER — Telehealth: Payer: Self-pay | Admitting: Family Medicine

## 2013-07-16 ENCOUNTER — Ambulatory Visit (INDEPENDENT_AMBULATORY_CARE_PROVIDER_SITE_OTHER): Payer: BC Managed Care – PPO | Admitting: Family Medicine

## 2013-07-16 VITALS — BP 126/82 | HR 80 | Temp 98.0°F | Resp 16 | Wt 213.1 lb

## 2013-07-16 DIAGNOSIS — L723 Sebaceous cyst: Secondary | ICD-10-CM

## 2013-07-16 DIAGNOSIS — R209 Unspecified disturbances of skin sensation: Secondary | ICD-10-CM

## 2013-07-16 DIAGNOSIS — R002 Palpitations: Secondary | ICD-10-CM

## 2013-07-16 DIAGNOSIS — R200 Anesthesia of skin: Secondary | ICD-10-CM

## 2013-07-16 LAB — CBC WITH DIFFERENTIAL/PLATELET
BASOS PCT: 0.3 % (ref 0.0–3.0)
Basophils Absolute: 0 10*3/uL (ref 0.0–0.1)
EOS PCT: 4.2 % (ref 0.0–5.0)
Eosinophils Absolute: 0.3 10*3/uL (ref 0.0–0.7)
HCT: 37.9 % (ref 36.0–46.0)
Hemoglobin: 12 g/dL (ref 12.0–15.0)
LYMPHS PCT: 32.4 % (ref 12.0–46.0)
Lymphs Abs: 2.4 10*3/uL (ref 0.7–4.0)
MCHC: 31.7 g/dL (ref 30.0–36.0)
MCV: 70.4 fl — ABNORMAL LOW (ref 78.0–100.0)
Monocytes Absolute: 0.4 10*3/uL (ref 0.1–1.0)
Monocytes Relative: 5.6 % (ref 3.0–12.0)
NEUTROS PCT: 57.5 % (ref 43.0–77.0)
Neutro Abs: 4.3 10*3/uL (ref 1.4–7.7)
Platelets: 321 10*3/uL (ref 150.0–400.0)
RBC: 5.38 Mil/uL — AB (ref 3.87–5.11)
RDW: 14.6 % (ref 11.5–15.5)
WBC: 7.4 10*3/uL (ref 4.0–10.5)

## 2013-07-16 LAB — BASIC METABOLIC PANEL
BUN: 12 mg/dL (ref 6–23)
CALCIUM: 9.4 mg/dL (ref 8.4–10.5)
CO2: 27 mEq/L (ref 19–32)
Chloride: 104 mEq/L (ref 96–112)
Creatinine, Ser: 0.7 mg/dL (ref 0.4–1.2)
GFR: 115.28 mL/min (ref >60.00)
Glucose, Bld: 70 mg/dL (ref 70–99)
Potassium: 3.5 mEq/L (ref 3.5–5.1)
SODIUM: 139 meq/L (ref 135–145)

## 2013-07-16 LAB — B12 AND FOLATE PANEL
FOLATE: 9 ng/mL (ref >5.9)
VITAMIN B 12: 306 pg/mL (ref 211–911)

## 2013-07-16 LAB — TSH: TSH: 0.36 u[IU]/mL (ref 0.35–4.50)

## 2013-07-16 NOTE — Patient Instructions (Signed)
Please schedule your physical at your convenience We'll notify you of your lab results and make any changes if needed Your numbness and tingling is a mixture of carpal tunnel and ulnar nerve inflammation at the elbow We'll call you with your nerve conduction study and determine the next steps Call with any questions or concerns Hang in there!

## 2013-07-16 NOTE — Progress Notes (Signed)
Pre visit review using our clinic review tool, if applicable. No additional management support is needed unless otherwise documented below in the visit note. 

## 2013-07-16 NOTE — Assessment & Plan Note (Signed)
Recurrent problem for pt.  Copious amount of foul smelling black sebum expressed w/ firm, steady pressure.  Pt tolerated w/o difficulty and expressed immediate relief of pressure.

## 2013-07-16 NOTE — Assessment & Plan Note (Addendum)
Recurrent problem for pt.  EKG WNL.  Suspect her sxs last night were due to her anxiety over her bilateral arm numbness.  Check labs to r/o underlying metabolic cause.  Will follow.

## 2013-07-16 NOTE — Assessment & Plan Note (Signed)
New.  Pt has sxs consistent w/ both bilateral carpal tunnel and bilateral ulnar nerve entrapment/irritation.  Will get nerve conduction study to determine severity and then refer to ortho or hand specialist to further evaluate and tx.  Pt expressed understanding and is in agreement w/ plan.

## 2013-07-16 NOTE — Telephone Encounter (Signed)
Patient Information:  Caller Name: Yeny  Phone: [phone]  Patient: [name]  Gender: Female  DOB: 19-Dec-1955  Age: 58 Years  PCP: Midge Minium  Pregnant: No  Office Follow Up:  Does the office need to follow up with this patient?: No  Instructions For The Office: N/A  RN Note:  Care advice and call back parameters reviewed. Appt scheduled with Dr. Birdie Riddle MD.  Symptoms  Reason For Call & Symptoms: Patient reports that last night she began to experience tingling in fingers, hands and arms.  "felt like it was going numb".  While at rest.  She also felt a "fluttering in her heart" which is not new but it occurred with the numbness.  Occurring off/on "all last night".  No chest pain , slight shortness of breath last night but none now".  she now has some "arm soreness"  Numbness with holding phone at times.  No dizziness or lightheaded. Voice is clear and calm  Reviewed Health History In EMR: Yes  Reviewed Medications In EMR: Yes  Reviewed Allergies In EMR: Yes  Reviewed Surgeries / Procedures: Yes  Date of Onset of Symptoms: 07/15/2013 OB / GYN:  LMP: Unknown  Guideline(s) Used:  Chest Pain  Disposition Per Guideline:   See Today in Office  Reason For Disposition Reached:   Patient wants to be seen  Advice Given:  Call Back If:  Severe chest pain  Constant chest pain lasting longer than 5 minutes  Difficulty breathing  You become worse.  Patient Will Follow Care Advice:  YES  Appointment Scheduled:  07/16/2013 11:00:00 Appointment Scheduled Provider:  Midge Minium.

## 2013-07-16 NOTE — Progress Notes (Signed)
   Subjective:    Patient ID: [name], female    DOB: [birth year], 58 y.o.   MRN: [id number]  HPI Bilateral hand numbness- started last night, radiated up into arms.  Was lying in bed at the time.  sxs improved w/ sitting up.  Worsened again w/ lying down.  sxs were most notable in middle fingers.  No new pillows or mattress.  Will have similar numbness when holding phone to face or resting elbow.  After pt got nervous about numbness last night she had palpitations and some 'light headedness'.  Hx of palpitations and holter monitor.  Sebaceous cyst- back, painful- 'like a pressure'.  Has been drained previously.  Pt asking for this again.  Review of Systems For ROS see HPI     Objective:   Physical Exam  Vitals reviewed. Constitutional: She is oriented to person, place, and time. She appears well-developed and well-nourished. No distress.  Cardiovascular: Normal rate, regular rhythm, normal heart sounds and intact distal pulses.   Pulmonary/Chest: Effort normal and breath sounds normal. No respiratory distress. She has no wheezes. She has no rales.  Abdominal: Soft. Bowel sounds are normal. She exhibits no distension. There is no tenderness. There is no rebound.  Neurological: She is alert and oriented to person, place, and time. She has normal reflexes. No cranial nerve deficit. Coordination normal.  + phalen's bilaterally + numbness w/ pressure over the ulnar groove bilaterally  Skin: Skin is warm and dry. No erythema.  Large sebaceous cyst on L upper back- copious amount of foul smelling, black sebum expressed w/ firm, steady pressure.  Pt reported instant relief of pressure          Assessment & Plan:

## 2013-08-06 ENCOUNTER — Encounter: Payer: BC Managed Care – PPO | Admitting: Diagnostic Neuroimaging

## 2013-08-27 ENCOUNTER — Other Ambulatory Visit: Payer: Self-pay

## 2013-08-27 ENCOUNTER — Encounter (INDEPENDENT_AMBULATORY_CARE_PROVIDER_SITE_OTHER): Payer: Self-pay

## 2013-08-27 ENCOUNTER — Ambulatory Visit (INDEPENDENT_AMBULATORY_CARE_PROVIDER_SITE_OTHER): Payer: BC Managed Care – PPO | Admitting: Diagnostic Neuroimaging

## 2013-08-27 DIAGNOSIS — R200 Anesthesia of skin: Secondary | ICD-10-CM

## 2013-08-27 DIAGNOSIS — Z0289 Encounter for other administrative examinations: Secondary | ICD-10-CM

## 2013-08-27 DIAGNOSIS — Z1231 Encounter for screening mammogram for malignant neoplasm of breast: Secondary | ICD-10-CM

## 2013-08-27 DIAGNOSIS — R209 Unspecified disturbances of skin sensation: Secondary | ICD-10-CM

## 2013-08-27 NOTE — Procedures (Signed)
   [facility]  NCS (NERVE CONDUCTION STUDY) WITH EMG (ELECTROMYOGRAPHY) REPORT   STUDY DATE: 08/27/13 PATIENT NAME: [name] DOB: March 04, 1955 MRN: [id number]  ORDERING CLINICIAN: Alver Fisher  TECHNOLOGIST: Laretta Alstrom ELECTROMYOGRAPHER: Earlean Polka. Rushawn Capshaw, MD  CLINICAL INFORMATION: 58 year old female with numbness and pain in the hands and feet.  FINDINGS: NERVE CONDUCTION STUDY: Bilateral median and ulnar motor responses and F-wave latencies are normal. Bilateral median and ulnar sensory responses are normal.  NEEDLE ELECTROMYOGRAPHY: Left deltoid, biceps, triceps, flexor carpi radialis, first dorsal interosseous and left C6-7 and left C7-T1 paraspinal muscles are normal.   IMPRESSION:  This is a normal study. No evidence of cervical radiculopathy or large fiber neuropathy at this time.   INTERPRETING PHYSICIAN:  Penni Bombard, MD Certified in Neurology, Neurophysiology and Neuroimaging  [facility] [address], [place], [place] [address] [phone]

## 2013-09-10 ENCOUNTER — Telehealth: Payer: Self-pay | Admitting: *Deleted

## 2013-09-10 ENCOUNTER — Ambulatory Visit
Admission: RE | Admit: 2013-09-10 | Discharge: 2013-09-10 | Disposition: A | Discharge status: Home / Self Care | Payer: BC Managed Care – PPO | Source: Ambulatory Visit

## 2013-09-10 DIAGNOSIS — Z1231 Encounter for screening mammogram for malignant neoplasm of breast: Secondary | ICD-10-CM

## 2013-09-10 NOTE — Telephone Encounter (Signed)
Calling patient to r/s appointment left message to call back and r/s appointment with NP CM or Dr. Krista Blue.

## 2013-09-12 ENCOUNTER — Other Ambulatory Visit: Payer: Self-pay | Admitting: Family Medicine

## 2013-09-12 DIAGNOSIS — R928 Other abnormal and inconclusive findings on diagnostic imaging of breast: Secondary | ICD-10-CM

## 2013-09-14 NOTE — Telephone Encounter (Signed)
Called patient again today left message to return the call to r/s her appointment on 09/28/13 with NP CM.

## 2013-09-18 ENCOUNTER — Ambulatory Visit
Admission: RE | Admit: 2013-09-18 | Discharge: 2013-09-18 | Disposition: A | Discharge status: Home / Self Care | Payer: BC Managed Care – PPO | Source: Ambulatory Visit | Attending: Family Medicine | Admitting: Family Medicine

## 2013-09-18 DIAGNOSIS — R928 Other abnormal and inconclusive findings on diagnostic imaging of breast: Secondary | ICD-10-CM

## 2013-09-18 NOTE — Telephone Encounter (Signed)
Patient called and cancelled appointment, stating she will call back later to r/s appointment with NP CM.

## 2013-09-28 ENCOUNTER — Ambulatory Visit: Payer: Self-pay | Admitting: Nurse Practitioner

## 2013-10-24 ENCOUNTER — Encounter: Payer: Self-pay | Admitting: Family Medicine

## 2013-10-24 ENCOUNTER — Ambulatory Visit (INDEPENDENT_AMBULATORY_CARE_PROVIDER_SITE_OTHER): Payer: BC Managed Care – PPO | Admitting: Family Medicine

## 2013-10-24 VITALS — BP 120/78 | HR 83 | Temp 98.0°F | Resp 16 | Ht 61.5 in | Wt 211.1 lb

## 2013-10-24 DIAGNOSIS — Z23 Encounter for immunization: Secondary | ICD-10-CM

## 2013-10-24 DIAGNOSIS — Z0000 Encounter for general adult medical examination without abnormal findings: Secondary | ICD-10-CM

## 2013-10-24 LAB — BASIC METABOLIC PANEL
BUN: 11 mg/dL (ref 6–23)
CALCIUM: 9.3 mg/dL (ref 8.4–10.5)
CHLORIDE: 106 meq/L (ref 96–112)
CO2: 30 meq/L (ref 19–32)
CREATININE: 0.7 mg/dL (ref 0.4–1.2)
GFR: 106.11 mL/min (ref >60.00)
GLUCOSE: 84 mg/dL (ref 70–99)
Potassium: 4 mEq/L (ref 3.5–5.1)
SODIUM: 142 meq/L (ref 135–145)

## 2013-10-24 LAB — CBC WITH DIFFERENTIAL/PLATELET
BASOS ABS: 0 10*3/uL (ref 0.0–0.1)
BASOS PCT: 0.6 % (ref 0.0–3.0)
EOS ABS: 0.3 10*3/uL (ref 0.0–0.7)
Eosinophils Relative: 5.2 % — ABNORMAL HIGH (ref 0.0–5.0)
HCT: 37.9 % (ref 36.0–46.0)
Hemoglobin: 12.2 g/dL (ref 12.0–15.0)
LYMPHS PCT: 40.4 % (ref 12.0–46.0)
Lymphs Abs: 2.4 10*3/uL (ref 0.7–4.0)
MCHC: 32.3 g/dL (ref 30.0–36.0)
MONO ABS: 0.3 10*3/uL (ref 0.1–1.0)
Monocytes Relative: 5.8 % (ref 3.0–12.0)
NEUTROS PCT: 48 % (ref 43.0–77.0)
Neutro Abs: 2.8 10*3/uL (ref 1.4–7.7)
PLATELETS: 303 10*3/uL (ref 150.0–400.0)
RBC: 5.42 Mil/uL — AB (ref 3.87–5.11)
RDW: 15.1 % (ref 11.5–15.5)
WBC: 5.9 10*3/uL (ref 4.0–10.5)

## 2013-10-24 LAB — LIPID PANEL
CHOLESTEROL: 162 mg/dL (ref 0–200)
HDL: 56.1 mg/dL (ref >39.00)
LDL Cholesterol: 93 mg/dL (ref 0–99)
NonHDL: 105.9
Total CHOL/HDL Ratio: 3
Triglycerides: 64 mg/dL (ref 0.0–149.0)
VLDL: 12.8 mg/dL (ref 0.0–40.0)

## 2013-10-24 LAB — HEPATIC FUNCTION PANEL
ALT: 22 U/L (ref 0–35)
AST: 25 U/L (ref 0–37)
Albumin: 3.6 g/dL (ref 3.5–5.2)
Alkaline Phosphatase: 64 U/L (ref 39–117)
BILIRUBIN DIRECT: 0.1 mg/dL (ref 0.0–0.3)
TOTAL PROTEIN: 7.6 g/dL (ref 6.0–8.3)
Total Bilirubin: 0.5 mg/dL (ref 0.2–1.2)

## 2013-10-24 LAB — VITAMIN D 25 HYDROXY (VIT D DEFICIENCY, FRACTURES): VITD: 18.02 ng/mL — AB (ref 30.00–100.00)

## 2013-10-24 LAB — TSH: TSH: 0.84 u[IU]/mL (ref 0.35–4.50)

## 2013-10-24 MED ORDER — RIZATRIPTAN BENZOATE 10 MG PO TABS: 10.0000 mg | ORAL_TABLET | ORAL | Status: DC | PRN

## 2013-10-24 NOTE — Progress Notes (Signed)
Pre visit review using our clinic review tool, if applicable. No additional management support is needed unless otherwise documented below in the visit note. 

## 2013-10-24 NOTE — Assessment & Plan Note (Signed)
Pt's PE WNL w/ exception of obesity.  UTD on health maintenance.  Check labs.  Anticipatory guidance provided.

## 2013-10-24 NOTE — Patient Instructions (Signed)
Follow up in 1 year or as needed Please call and schedule a follow up appt w/ Neurology [facility] notify you of your lab results and make any changes if needed Try and make healthy food choices and get regular exercise Call with any questions or concerns Happy Fall!!

## 2013-10-24 NOTE — Progress Notes (Signed)
   Subjective:    Patient ID: [name], female    DOB: [birth year], 58 y.o.   MRN: [id number]  HPI CPE- UTD on mammo, colonoscopy, DEXA.  No need for paps due to hysterectomy.   Review of Systems Patient reports no vision/ hearing changes, adenopathy,fever, weight change,  persistant/recurrent hoarseness , swallowing issues, chest pain, palpitations, edema, persistant/recurrent cough, hemoptysis, dyspnea (rest/exertional/paroxysmal nocturnal), gastrointestinal bleeding (melena, rectal bleeding), abdominal pain, significant heartburn, bowel changes, GU symptoms (dysuria, hematuria, incontinence), Gyn symptoms (abnormal  bleeding, pain),  syncope, memory loss, skin/hair/nail changes, abnormal bruising or bleeding, anxiety, or depression.  + weakness and numbness of arms and legs- currently seeing Neuro     Objective:   Physical Exam General Appearance:    Alert, cooperative, no distress, appears stated age, obese  Head:    Normocephalic, without obvious abnormality, atraumatic  Eyes:    PERRL, conjunctiva/corneas clear, EOM's intact, fundi    benign, both eyes  Ears:    Normal TM's and external ear canals, both ears  Nose:   Nares normal, septum midline, mucosa normal, no drainage    or sinus tenderness  Throat:   Lips, mucosa, and tongue normal; teeth and gums normal  Neck:   Supple, symmetrical, trachea midline, no adenopathy;    Thyroid: no enlargement/tenderness/nodules  Back:     Symmetric, no curvature, ROM normal, no CVA tenderness  Lungs:     Clear to auscultation bilaterally, respirations unlabored  Chest Wall:    No tenderness or deformity   Heart:    Regular rate and rhythm, S1 and S2 normal, no murmur, rub   or gallop  Breast Exam:    Deferred to GYN  Abdomen:     Soft, non-tender, bowel sounds active all four quadrants,    no masses, no organomegaly  Genitalia:    Deferred to GYN  Rectal:    Extremities:   Extremities normal, atraumatic, no cyanosis or edema    Pulses:   2+ and symmetric all extremities  Skin:   Skin color, texture, turgor normal, no rashes or lesions  Lymph nodes:   Cervical, supraclavicular, and axillary nodes normal  Neurologic:   CNII-XII intact, normal strength, sensation and reflexes    throughout          Assessment & Plan:

## 2013-10-25 ENCOUNTER — Other Ambulatory Visit: Payer: Self-pay | Admitting: General Practice

## 2013-10-25 MED ORDER — VITAMIN D (ERGOCALCIFEROL) 1.25 MG (50000 UNIT) PO CAPS: 50000.0000 [IU] | ORAL_CAPSULE | ORAL | Status: DC

## 2013-11-21 ENCOUNTER — Ambulatory Visit (INDEPENDENT_AMBULATORY_CARE_PROVIDER_SITE_OTHER): Payer: BC Managed Care – PPO | Admitting: Family Medicine

## 2013-11-21 ENCOUNTER — Encounter: Payer: Self-pay | Admitting: Family Medicine

## 2013-11-21 VITALS — BP 120/84 | HR 98 | Temp 98.0°F | Resp 16 | Wt 210.2 lb

## 2013-11-21 DIAGNOSIS — R109 Unspecified abdominal pain: Secondary | ICD-10-CM

## 2013-11-21 LAB — POCT URINALYSIS DIPSTICK
Bilirubin, UA: NEGATIVE
Blood, UA: NEGATIVE
Glucose, UA: NEGATIVE
KETONES UA: NEGATIVE
LEUKOCYTES UA: NEGATIVE
NITRITE UA: NEGATIVE
PH UA: 6
PROTEIN UA: NEGATIVE
Spec Grav, UA: 1.025
UROBILINOGEN UA: 0.2

## 2013-11-21 MED ORDER — HYDROCODONE-ACETAMINOPHEN 5-325 MG PO TABS: 1.0000 | ORAL_TABLET | Freq: Four times a day (QID) | ORAL | Status: DC | PRN

## 2013-11-21 MED ORDER — KETOROLAC TROMETHAMINE 60 MG/2ML IM SOLN
60.0000 mg | Freq: Once | INTRAMUSCULAR | Status: AC
  Administered 2013-11-21: 60 mg via INTRAMUSCULAR

## 2013-11-21 MED ORDER — CYCLOBENZAPRINE HCL 10 MG PO TABS
10.0000 mg | ORAL_TABLET | Freq: Three times a day (TID) | ORAL | Status: DC | PRN
Start: 2013-11-21 — End: 2014-03-06

## 2013-11-21 MED ORDER — KETOROLAC TROMETHAMINE 60 MG/2ML IJ SOLN: 60.0000 mg | Freq: Once | INTRAMUSCULAR | Status: DC

## 2013-11-21 NOTE — Assessment & Plan Note (Signed)
Acute issue for pt.  Pt is very sensitive to light touch which makes me consider shingles as a possibility- however pt doesn't have a rash.  Cautioned her to look for this.  Pt denies urinary sxs and UA is negative.  Will send urine for cx but stone unlikely w/o any microscopic blood.  Pt has hx of muscle spasm- will give Toradol injxn for pain and start Vicodin and flexeril prn.  If no improvement by Friday, pt to call and we will get CT to r/o stone.  Reviewed supportive care and red flags that should prompt return.  Pt expressed understanding and is in agreement w/ plan.

## 2013-11-21 NOTE — Progress Notes (Signed)
Pre visit review using our clinic review tool, if applicable. No additional management support is needed unless otherwise documented below in the visit note. 

## 2013-11-21 NOTE — Addendum Note (Signed)
Addended by: Harl Bowie on: 11/21/2013 03:50 PM   Modules accepted: Orders

## 2013-11-21 NOTE — Patient Instructions (Signed)
If no improvement by Friday morning- call me and we'll get imaging done Start the Hydrocodone as needed for pain- will cause drowsiness Use the flexeril as needed for spasm- may cause drowsiness HEAT! Please watch for a rash to appear- if it does, call right away so we can get you started on shingles medication If you develop any urinary symptoms- call me! Call with any questions or concerns Hang in there!!!

## 2013-11-21 NOTE — Progress Notes (Signed)
   Subjective:    Patient ID: [name], female    DOB: 11-09-1955, 58 y.o.   MRN: [id number]  HPI Flank pain- R sided, started Monday afternoon.  Pt has alternated heat/ice.  No relief w/ flexeril or tramadol.  Pt uncomfortable with sitting, difficulty rising from seated position.  Pt unable to turn w/o severe pain.  Pt reports spasm.  No radiation into buttock or legs.  No weakness/numbness.  No change in activity or known injury.  No burning w/ urination, no blood in urine.   Review of Systems For ROS see HPI     Objective:   Physical Exam  Vitals reviewed. Constitutional: She is oriented to person, place, and time. She appears well-developed and well-nourished. She appears distressed (pt clearly in a lot of pain).  Musculoskeletal: She exhibits tenderness (over R lat).  Pain w/ rising from seated position, walking, twisting  Neurological: She is alert and oriented to person, place, and time. She has normal reflexes. No cranial nerve deficit.  Antalgic gait  Skin: Skin is warm and dry. No rash noted. No erythema.  Tender to lightly touch skin over R flank          Assessment & Plan:

## 2013-11-22 LAB — URINE CULTURE: Colony Count: 30000

## 2013-11-23 ENCOUNTER — Encounter: Payer: Self-pay | Admitting: Family Medicine

## 2013-11-23 ENCOUNTER — Ambulatory Visit (INDEPENDENT_AMBULATORY_CARE_PROVIDER_SITE_OTHER): Payer: BC Managed Care – PPO | Admitting: Family Medicine

## 2013-11-23 VITALS — BP 130/84 | HR 93 | Temp 97.9°F | Resp 16 | Wt 212.2 lb

## 2013-11-23 DIAGNOSIS — M5416 Radiculopathy, lumbar region: Secondary | ICD-10-CM | POA: Insufficient documentation

## 2013-11-23 MED ORDER — PREDNISONE 10 MG PO TABS: ORAL_TABLET | ORAL | Status: DC

## 2013-11-23 MED ORDER — METHYLPREDNISOLONE ACETATE 80 MG/ML IJ SUSP
80.0000 mg | Freq: Once | INTRAMUSCULAR | Status: AC
  Administered 2013-11-23: 80 mg via INTRAMUSCULAR

## 2013-11-23 NOTE — Patient Instructions (Signed)
Start the Prednisone as directed starting tomorrow- take all 3 pills at the same time- take w/ food Continue the pain meds as needed for severe pain Flexeril (muscle relaxer) at night Call with any questions or concerns Hang in there!

## 2013-11-23 NOTE — Progress Notes (Signed)
   Subjective:    Patient ID: [name], female    DOB: June 05, 1955, 58 y.o.   MRN: [id number]  HPI R hip pain- pt was seen 2 days ago for R flank pain.  Pt reports pain is now concentrated around R hip and buttock.  No urinary symptoms.  Last visit was given toradol injxn and script for Vicodin.  Pt felt the toradol was helpful in relieving spasm.   Review of Systems For ROS see HPI     Objective:   Physical Exam  Vitals reviewed. Constitutional: She is oriented to person, place, and time. She appears well-developed and well-nourished.  Cardiovascular: Intact distal pulses.   Musculoskeletal: She exhibits tenderness (TTP over R buttock, lumbar paraspinal muscles).  Neurological: She is alert and oriented to person, place, and time. She has normal reflexes. No cranial nerve deficit. Coordination normal.  Pt ambulating today w/o difficulty Mild SLR on R          Assessment & Plan:

## 2013-11-23 NOTE — Progress Notes (Signed)
Pre visit review using our clinic review tool, if applicable. No additional management support is needed unless otherwise documented below in the visit note. 

## 2013-11-23 NOTE — Assessment & Plan Note (Signed)
New.  Pt's previous flank pain has now localized over R buttock and sciatic notch.  Due to inflammation, start steroids.  Depo-medrol given in office, pt to start pred taper tomorrow.  Reviewed supportive care and red flags that should prompt return.  Pt expressed understanding and is in agreement w/ plan.

## 2014-03-05 ENCOUNTER — Encounter: Payer: Self-pay | Admitting: *Deleted

## 2014-03-06 ENCOUNTER — Encounter: Payer: Self-pay | Admitting: Internal Medicine

## 2014-03-06 ENCOUNTER — Ambulatory Visit (INDEPENDENT_AMBULATORY_CARE_PROVIDER_SITE_OTHER): Payer: BLUE CROSS/BLUE SHIELD | Admitting: Internal Medicine

## 2014-03-06 ENCOUNTER — Other Ambulatory Visit (INDEPENDENT_AMBULATORY_CARE_PROVIDER_SITE_OTHER): Payer: BLUE CROSS/BLUE SHIELD

## 2014-03-06 VITALS — BP 114/72 | HR 80 | Ht 61.0 in | Wt 212.0 lb

## 2014-03-06 DIAGNOSIS — R718 Other abnormality of red blood cells: Secondary | ICD-10-CM

## 2014-03-06 DIAGNOSIS — K51918 Ulcerative colitis, unspecified with other complication: Secondary | ICD-10-CM

## 2014-03-06 LAB — CBC WITH DIFFERENTIAL/PLATELET
BASOS PCT: 0.4 % (ref 0.0–3.0)
Basophils Absolute: 0 10*3/uL (ref 0.0–0.1)
EOS ABS: 0.4 10*3/uL (ref 0.0–0.7)
Eosinophils Relative: 5.3 % — ABNORMAL HIGH (ref 0.0–5.0)
HCT: 39.2 % (ref 36.0–46.0)
HEMOGLOBIN: 12.7 g/dL (ref 12.0–15.0)
Lymphocytes Relative: 35.7 % (ref 12.0–46.0)
Lymphs Abs: 2.4 10*3/uL (ref 0.7–4.0)
MCHC: 32.5 g/dL (ref 30.0–36.0)
MONOS PCT: 5.9 % (ref 3.0–12.0)
Monocytes Absolute: 0.4 10*3/uL (ref 0.1–1.0)
NEUTROS ABS: 3.6 10*3/uL (ref 1.4–7.7)
Neutrophils Relative %: 52.7 % (ref 43.0–77.0)
Platelets: 307 10*3/uL (ref 150.0–400.0)
RBC: 5.73 Mil/uL — AB (ref 3.87–5.11)
RDW: 15.1 % (ref 11.5–15.5)
WBC: 6.8 10*3/uL (ref 4.0–10.5)

## 2014-03-06 MED ORDER — MESALAMINE 1.2 G PO TBEC: 2.4000 g | DELAYED_RELEASE_TABLET | Freq: Every day | ORAL | Status: DC

## 2014-03-06 NOTE — Patient Instructions (Signed)
We have sent the following medications to your pharmacy for you to pick up at your convenience: Lialda 2.4 grams daily (we have also given you samples of this)  Your physician has requested that you go to the basement for the following lab work before leaving today: CBC, CMP, CRP, ferritin, hgb electrophoresis  CC: [name]

## 2014-03-07 LAB — COMPREHENSIVE METABOLIC PANEL
ALT: 19 U/L (ref 0–35)
AST: 22 U/L (ref 0–37)
Albumin: 4 g/dL (ref 3.5–5.2)
Alkaline Phosphatase: 74 U/L (ref 39–117)
BUN: 16 mg/dL (ref 6–23)
CO2: 25 meq/L (ref 19–32)
Calcium: 9.7 mg/dL (ref 8.4–10.5)
Chloride: 105 mEq/L (ref 96–112)
Creatinine, Ser: 0.76 mg/dL (ref 0.40–1.20)
GFR: 101.15 mL/min (ref >60.00)
Glucose, Bld: 89 mg/dL (ref 70–99)
POTASSIUM: 4.3 meq/L (ref 3.5–5.1)
Sodium: 141 mEq/L (ref 135–145)
TOTAL PROTEIN: 7.6 g/dL (ref 6.0–8.3)
Total Bilirubin: 0.3 mg/dL (ref 0.2–1.2)

## 2014-03-07 LAB — HIGH SENSITIVITY CRP: CRP HIGH SENSITIVITY: 9.3 mg/L — AB (ref 0.000–5.000)

## 2014-03-07 LAB — FERRITIN: FERRITIN: 138.1 ng/mL (ref 10.0–291.0)

## 2014-03-07 NOTE — Progress Notes (Signed)
Subjective:    Patient ID: [name], female    DOB: 01-15-1956, 59 y.o.   MRN: [id number]  HPI [name] is a 59 year old female with a past medical history of pan-ulcerative colitis diagnosed approximately 30 years ago, migraines and mild asthma who is seen for follow-up. She was last seen in October 2014. At that time she had received a prednisone taper and went into clinical remission. She was continued on Lialda 2.4 g daily. We have discussed escalation of therapy to include biologic or immune modulator therapy but she was hesitant. She also had microcytosis at that time without true anemia.  She returns today and she has noticed return of frequent loose stools. She is now off of mesalamine altogether. She denies abdominal pain or blood in her stool. No weight loss. Good appetite. No heartburn. No trouble swallowing. No hepatobiliary complaint. No fever or chills. No rashes. She does have significant joint pains particularly with her knees and is taking diclofenac.  Review of Systems As per history of present illness, otherwise negative  Current Medications, Allergies, Past Medical History, Past Surgical History, Family History and Social History were reviewed in [facility] record.     Objective:   Physical Exam BP 114/72 mmHg  Pulse 80  Ht 5' 1"  (1.549 m)  Wt 212 lb (96.163 kg)  BMI 40.08 kg/m2 Constitutional: Well-developed and well-nourished. No distress. HEENT: Normocephalic and atraumatic. Oropharynx is clear and moist. No oropharyngeal exudate. Conjunctivae are normal.  No scleral icterus. Neck: Neck supple. Trachea midline. Cardiovascular: Normal rate, regular rhythm and intact distal pulses. No M/R/G Pulmonary/chest: Effort normal and breath sounds normal. No wheezing, rales or rhonchi. Abdominal: Soft, diffuse tenderness without rebound or guarding, nondistended. Bowel sounds active throughout. There are no masses palpable. No  hepatosplenomegaly. Extremities: no clubbing, cyanosis, or edema Lymphadenopathy: No cervical adenopathy noted. Neurological: Alert and oriented to person place and time. Skin: Skin is warm and dry. No rashes noted. Psychiatric: Normal mood and affect. Behavior is normal.  CBC    Component Value Date/Time   WBC 6.8 03/06/2014 1708   RBC 5.73* 03/06/2014 1708   HGB 12.7 03/06/2014 1708   HCT 39.2 03/06/2014 1708   PLT 307.0 03/06/2014 1708   MCV 68.3 Repeated and verified X2.* 03/06/2014 1708   MCH 22.2* 05/19/2012 1628   MCHC 32.5 03/06/2014 1708   RDW 15.1 03/06/2014 1708   LYMPHSABS 2.4 03/06/2014 1708   MONOABS 0.4 03/06/2014 1708   EOSABS 0.4 03/06/2014 1708   BASOSABS 0.0 03/06/2014 1708    CMP     Component Value Date/Time   NA 141 03/06/2014 1708   K 4.3 03/06/2014 1708   CL 105 03/06/2014 1708   CO2 25 03/06/2014 1708   GLUCOSE 89 03/06/2014 1708   BUN 16 03/06/2014 1708   CREATININE 0.76 03/06/2014 1708   CREATININE 0.73 05/19/2012 1628   CALCIUM 9.7 03/06/2014 1708   PROT 7.6 03/06/2014 1708   ALBUMIN 4.0 03/06/2014 1708   AST 22 03/06/2014 1708   ALT 19 03/06/2014 1708   ALKPHOS 74 03/06/2014 1708   BILITOT 0.3 03/06/2014 1708   GFRNONAA 94.13 12/31/2009 1529    Iron/TIBC/Ferritin/ %Sat    Component Value Date/Time   IRON 82 11/22/2012 0939   FERRITIN 138.1 03/06/2014 1708   IRONPCTSAT 21.7 11/22/2012 0939       Assessment & Plan:  59 year old female with a past medical history of pan-ulcerative colitis diagnosed approximately 30 years ago, migraines and mild  asthma who is seen for follow-up.  1. Pan-UC -- mild the long-standing. Since being lost to follow-up briefly, she has been off of mesalamine. She's noticed loose and more frequent stools over the last several months. This had improved after prednisone taper and on mesalamine. We will restart Lialda 2.4 g daily. Check CRP. Check CMP. I would like to see her back in 2-3 months to discuss how  she is feeling after restarting mesalamine. Her last colonoscopy was 07/07/2012, and she would be due surveillance later this year. We will discuss this at follow-up. She is up-to-date on vaccinations  2. Microcytosis -- no evidence of iron deficiency. Highly suspicious for thalassemia. Check hemoglobin electrophoresis.

## 2014-03-08 LAB — HEMOGLOBINOPATHY EVALUATION
HEMOGLOBIN OTHER: 0 %
HGB S QUANTITAION: 0 %
Hgb A2 Quant: 5.4 % — ABNORMAL HIGH (ref 2.2–3.2)
Hgb A: 94.2 % — ABNORMAL LOW (ref 96.8–97.8)
Hgb F Quant: 0.4 % (ref 0.0–2.0)

## 2014-03-11 ENCOUNTER — Encounter: Payer: Self-pay | Admitting: Internal Medicine

## 2014-03-11 DIAGNOSIS — D563 Thalassemia minor: Secondary | ICD-10-CM | POA: Insufficient documentation

## 2014-04-05 ENCOUNTER — Encounter: Payer: Self-pay | Admitting: Family Medicine

## 2014-04-05 ENCOUNTER — Ambulatory Visit (INDEPENDENT_AMBULATORY_CARE_PROVIDER_SITE_OTHER): Payer: BLUE CROSS/BLUE SHIELD | Admitting: Family Medicine

## 2014-04-05 VITALS — BP 128/80 | HR 95 | Temp 98.0°F | Resp 16 | Wt 214.1 lb

## 2014-04-05 DIAGNOSIS — M546 Pain in thoracic spine: Secondary | ICD-10-CM | POA: Insufficient documentation

## 2014-04-05 MED ORDER — CYCLOBENZAPRINE HCL 10 MG PO TABS: 10.0000 mg | ORAL_TABLET | Freq: Three times a day (TID) | ORAL | Status: DC | PRN

## 2014-04-05 MED ORDER — MELOXICAM 15 MG PO TABS: 15.0000 mg | ORAL_TABLET | Freq: Every day | ORAL | Status: DC

## 2014-04-05 NOTE — Assessment & Plan Note (Signed)
New.  Pt's sxs are consistent w/ lat spasm/strain.  + TTP of L lat w/ extension into lower and upper L trap.  Start scheduled NSAIDs, flexeril.  If no improvement, will do pred taper.  Reviewed supportive care and red flags that should prompt return.  Pt expressed understanding and is in agreement w/ plan.

## 2014-04-05 NOTE — Progress Notes (Signed)
   Subjective:    Patient ID: [name], female    DOB: 1955-12-24, 59 y.o.   MRN: [id number]  HPI Back pain- sxs started 4-5 days ago.  Tuesday had to leave work.  Has been alternating heat/cold compresses.  Pain is thoracic and cervical.  Painful to left L arm and flex/extend neck.  No heavy lifting, change in activity.  No known injury.  Pain will occasionally radiate into lower back and down L arm.  No fevers.  No numbness or weakness.  Has prescription for diclofenac but has not taken regularly in the last week or so.   Review of Systems For ROS see HPI     Objective:   Physical Exam  Constitutional: She is oriented to person, place, and time. She appears well-developed and well-nourished. No distress.  HENT:  Head: Normocephalic and atraumatic.  Neck:  L trap spasm  Cardiovascular: Intact distal pulses.   Musculoskeletal: She exhibits tenderness (TTP over L lat, pain w/ movement of L shoulder).  Neurological: She is alert and oriented to person, place, and time. She has normal reflexes. No cranial nerve deficit. Coordination normal.  Skin: Skin is warm and dry. No erythema.  Vitals reviewed.         Assessment & Plan:

## 2014-04-05 NOTE — Patient Instructions (Signed)
Follow up by phone on Monday if no improvement (that way we can start the prednisone) STOP the Diclofenac START the once daily Mobic- take w/ food Use the flexeril up to 3x/day as needed for spasm- will cause drowsiness HEAT!! Do some gentle stretching- move to the point of pulling and hold Call with any questions or concerns Hang in there!!!

## 2014-04-05 NOTE — Progress Notes (Signed)
Pre visit review using our clinic review tool, if applicable. No additional management support is needed unless otherwise documented below in the visit note. 

## 2014-04-09 ENCOUNTER — Telehealth: Payer: Self-pay | Admitting: Family Medicine

## 2014-04-09 MED ORDER — PREDNISONE 10 MG PO TABS: ORAL_TABLET | ORAL | Status: DC

## 2014-04-09 NOTE — Telephone Encounter (Signed)
Caller name: Sianni, Cloninger Relation to pt: self  Call back number: [phone] work#    Reason for call:  Pt was advised to call in if upper back pain persisted.

## 2014-04-09 NOTE — Telephone Encounter (Signed)
Pred taper sent to pharmacy.  Please call pt and indicate it's available for pick up.  If no improvement after this, will need to see ortho.

## 2014-04-09 NOTE — Telephone Encounter (Signed)
Please advise on the directions

## 2014-04-17 ENCOUNTER — Encounter: Payer: Self-pay | Admitting: Internal Medicine

## 2014-07-09 ENCOUNTER — Encounter: Payer: Self-pay | Admitting: *Deleted

## 2014-08-08 ENCOUNTER — Ambulatory Visit (INDEPENDENT_AMBULATORY_CARE_PROVIDER_SITE_OTHER): Payer: BLUE CROSS/BLUE SHIELD | Admitting: Internal Medicine

## 2014-08-08 ENCOUNTER — Encounter: Payer: Self-pay | Admitting: Internal Medicine

## 2014-08-08 VITALS — BP 100/72 | HR 92 | Ht 61.25 in | Wt 217.2 lb

## 2014-08-08 DIAGNOSIS — K5190 Ulcerative colitis, unspecified, without complications: Secondary | ICD-10-CM

## 2014-08-08 DIAGNOSIS — D563 Thalassemia minor: Secondary | ICD-10-CM | POA: Diagnosis not present

## 2014-08-08 MED ORDER — NA SULFATE-K SULFATE-MG SULF 17.5-3.13-1.6 GM/177ML PO SOLN: ORAL | Status: DC

## 2014-08-08 NOTE — Progress Notes (Signed)
Subjective:    Patient ID: [name], female    DOB: November 16, 1955, 59 y.o.   MRN: [id number]  HPI [name] is a 59 year old female with a past medical history of pan ulcerative colitis diagnosed approximately 30 years ago, migraines and mild asthma who is here for follow-up. She was last seen in January 2016. At that time she was having loose more frequent stools and diffuse abdominal discomfort. She was restarted on mesalamine in the form of Lialda 2.4 g daily. She reports that she took the medication on a regular basis and had resolution of her diarrhea and abdominal pain. She then admits to stopping the medications that she was feeling better in about 6 weeks ago symptoms return. She is now having loose stools again 3-4 times per day. About a month ago her stools were slightly bloody but the blood has resolved. Stools have not yet formed back up as before. She is not having abdominal pain. Reports good appetite. No nausea or vomiting. No fevers, chills or night sweats. Chronic knee pain with plans for joint replacement in November. Dr. Alvan Dame is going to do the surgery but they chose to wait until after her vacation to [place] which is scheduled for early November. She is using Aleve fairly frequently for knee pain.   Review of Systems As per history of present illness, otherwise negative  Current Medications, Allergies, Past Medical History, Past Surgical History, Family History and Social History were reviewed in [employer] record.     Objective:   Physical Exam BP 100/72 mmHg  Pulse 92  Ht 5' 1.25" (1.556 m)  Wt 217 lb 4 oz (98.544 kg)  BMI 40.70 kg/m2 Constitutional: Well-developed and well-nourished. No distress. HEENT: Normocephalic and atraumatic. Oropharynx is clear and moist. No oropharyngeal exudate. Conjunctivae are normal.  No scleral icterus. Neck: Neck supple. Trachea midline. Cardiovascular: Normal rate, regular rhythm and intact distal  pulses. No M/R/G Pulmonary/chest: Effort normal and breath sounds normal. No wheezing, rales or rhonchi. Abdominal: Soft, very mildly tender diffusely, nondistended. Bowel sounds active throughout. There are no masses palpable. Extremities: no clubbing, cyanosis, or edema Lymphadenopathy: No cervical adenopathy noted. Neurological: Alert and oriented to person place and time. Skin: Skin is warm and dry. No rashes noted. Psychiatric: Normal mood and affect. Behavior is normal.  CBC    Component Value Date/Time   WBC 6.8 03/06/2014 1708   RBC 5.73* 03/06/2014 1708   HGB 12.7 03/06/2014 1708   HCT 39.2 03/06/2014 1708   PLT 307.0 03/06/2014 1708   MCV 68.3 Repeated and verified X2.* 03/06/2014 1708   MCH 22.2* 05/19/2012 1628   MCHC 32.5 03/06/2014 1708   RDW 15.1 03/06/2014 1708   LYMPHSABS 2.4 03/06/2014 1708   MONOABS 0.4 03/06/2014 1708   EOSABS 0.4 03/06/2014 1708   BASOSABS 0.0 03/06/2014 1708    CMP     Component Value Date/Time   NA 141 03/06/2014 1708   K 4.3 03/06/2014 1708   CL 105 03/06/2014 1708   CO2 25 03/06/2014 1708   GLUCOSE 89 03/06/2014 1708   BUN 16 03/06/2014 1708   CREATININE 0.76 03/06/2014 1708   CREATININE 0.73 05/19/2012 1628   CALCIUM 9.7 03/06/2014 1708   PROT 7.6 03/06/2014 1708   ALBUMIN 4.0 03/06/2014 1708   AST 22 03/06/2014 1708   ALT 19 03/06/2014 1708   ALKPHOS 74 03/06/2014 1708   BILITOT 0.3 03/06/2014 1708   GFRNONAA 94.13 12/31/2009 1529     Hemoglobinopathy  panel -- beta thalassemia trait  Iron/TIBC/Ferritin/ %Sat    Component Value Date/Time   IRON 82 11/22/2012 0939   FERRITIN 138.1 03/06/2014 1708   IRONPCTSAT 21.7 11/22/2012 0939       Assessment & Plan:  59 year old female with a past medical history of pan ulcerative colitis diagnosed approximately 30 years ago, migraines and mild asthma who is here for follow-up.   1. Pan- UC -- long-standing and mild. Symptoms return when she discontinued mesalamine. We had  an honest conversation that this is a chronic process and will need chronic therapy. She is back on Lialda 2.4 g daily and seems to be improving. I encouraged her to continue this medication indefinitely without interruption. She seems to understand this now. She is due surveillance colonoscopy given long-standing ulcerative colitis. We discussed the risks, benefits and alternatives and she is agreeable to proceed.  I would like her to limit NSAIDs if possible  2. Microcytosis without anemia -- iron studies were checked as my suspicion was she would be iron deficient. She is not. Hemoglobinopathy panel reveals beta thalassemia trait explaining the microcytosis  25 minutes spent with the patient today

## 2014-08-08 NOTE — Patient Instructions (Addendum)
We have sent the following medications to your pharmacy for you to pick up at your convenience:Suprep  You have been scheduled for a colonoscopy. Please follow written instructions given to you at your visit today.  Please pick up your prep supplies at the pharmacy within the next 1-3 days. If you use inhalers (even only as needed), please bring them with you on the day of your procedure.  Avoid NSAIDS  Continue Lialda 2.4g daily.

## 2014-08-29 ENCOUNTER — Ambulatory Visit (INDEPENDENT_AMBULATORY_CARE_PROVIDER_SITE_OTHER): Payer: BLUE CROSS/BLUE SHIELD | Admitting: Family Medicine

## 2014-08-29 ENCOUNTER — Encounter: Payer: Self-pay | Admitting: Family Medicine

## 2014-08-29 VITALS — BP 110/78 | HR 77 | Temp 98.2°F | Resp 17 | Ht 61.0 in | Wt 218.1 lb

## 2014-08-29 DIAGNOSIS — M5416 Radiculopathy, lumbar region: Secondary | ICD-10-CM | POA: Diagnosis not present

## 2014-08-29 MED ORDER — CYCLOBENZAPRINE HCL 10 MG PO TABS: 10.0000 mg | ORAL_TABLET | Freq: Three times a day (TID) | ORAL | Status: DC | PRN

## 2014-08-29 MED ORDER — PREDNISONE 10 MG PO TABS: ORAL_TABLET | ORAL | Status: DC

## 2014-08-29 NOTE — Progress Notes (Signed)
Pre visit review using our clinic review tool, if applicable. No additional management support is needed unless otherwise documented below in the visit note. 

## 2014-08-29 NOTE — Progress Notes (Signed)
   Subjective:    Patient ID: [name], female    DOB: 1955/09/30, 59 y.o.   MRN: [id number]  HPI Sciatica- sxs started ~9 months ago but has been progressively worsening.  Now unable to stand for any period of time or walk any distance w/o severe pain.  Pain is R sided and radiates into buttock and thigh.  Intermittent weakness.  Pt reports she is able to ease the pain but 'the majority of the time it's hurting'.   Review of Systems For ROS see HPI     Objective:   Physical Exam  Constitutional: She is oriented to person, place, and time. She appears well-developed and well-nourished. No distress.  obese  HENT:  Head: Normocephalic and atraumatic.  Cardiovascular: Intact distal pulses.   Musculoskeletal: She exhibits tenderness (TTP over R SI joint and lumbar paraspinal muscle). She exhibits no edema.  Neurological: She is alert and oriented to person, place, and time. She has normal reflexes. No cranial nerve deficit. Coordination normal.  Mildly + SLR on R, neg on L Antalgic gait  Skin: Skin is warm and dry.  Psychiatric: She has a normal mood and affect. Her behavior is normal. Thought content normal.  Vitals reviewed.         Assessment & Plan:

## 2014-08-29 NOTE — Patient Instructions (Signed)
Schedule your complete physical at your convenience We'll call you with your ortho appt Use the Flexeril at night for muscle spasm Take the Prednisone as directed- take w/ food Call with any questions or concerns Hang in there!!!

## 2014-08-30 NOTE — Assessment & Plan Note (Signed)
Deteriorated.  Pt has required pred taper on 3 separate occasions.  Now in near constant discomfort.  Refill Prednisone, restart flexeril.  Refer to Ortho.  Reviewed supportive care and red flags that should prompt return.  Pt expressed understanding and is in agreement w/ plan.

## 2014-10-04 ENCOUNTER — Encounter: Payer: Self-pay | Admitting: Internal Medicine

## 2014-10-04 ENCOUNTER — Ambulatory Visit (AMBULATORY_SURGERY_CENTER): Payer: BLUE CROSS/BLUE SHIELD | Admitting: Internal Medicine

## 2014-10-04 VITALS — BP 135/73 | HR 86 | Temp 98.1°F | Resp 22 | Ht 61.25 in | Wt 217.0 lb

## 2014-10-04 DIAGNOSIS — K5190 Ulcerative colitis, unspecified, without complications: Secondary | ICD-10-CM

## 2014-10-04 DIAGNOSIS — K51918 Ulcerative colitis, unspecified with other complication: Secondary | ICD-10-CM

## 2014-10-04 MED ORDER — MESALAMINE 1.2 G PO TBEC: 2.4000 g | DELAYED_RELEASE_TABLET | Freq: Every day | ORAL | Status: DC

## 2014-10-04 MED ORDER — SODIUM CHLORIDE 0.9 % IV SOLN: 500.0000 mL | INTRAVENOUS | Status: DC

## 2014-10-04 NOTE — Patient Instructions (Signed)
YOU HAD AN ENDOSCOPIC PROCEDURE TODAY AT [facility]:   Refer to the procedure report that was given to you for any specific questions about what was found during the examination.  If the procedure report does not answer your questions, please call your gastroenterologist to clarify.  If you requested that your care partner not be given the details of your procedure findings, then the procedure report has been included in a sealed envelope for you to review at your convenience later.  YOU SHOULD EXPECT: Some feelings of bloating in the abdomen. Passage of more gas than usual.  Walking can help get rid of the air that was put into your GI tract during the procedure and reduce the bloating. If you had a lower endoscopy (such as a colonoscopy or flexible sigmoidoscopy) you may notice spotting of blood in your stool or on the toilet paper. If you underwent a bowel prep for your procedure, you may not have a normal bowel movement for a few days.  Please Note:  You might notice some irritation and congestion in your nose or some drainage.  This is from the oxygen used during your procedure.  There is no need for concern and it should clear up in a day or so.  SYMPTOMS TO REPORT IMMEDIATELY:   Following lower endoscopy (colonoscopy or flexible sigmoidoscopy):  Excessive amounts of blood in the stool  Significant tenderness or worsening of abdominal pains  Swelling of the abdomen that is new, acute  Fever of 100F or higher    For urgent or emergent issues, a gastroenterologist can be reached at any hour by calling [phone].   DIET: Your first meal following the procedure should be a small meal and then it is ok to progress to your normal diet. Heavy or fried foods are harder to digest and may make you feel nauseous or bloated.  Likewise, meals heavy in dairy and vegetables can increase bloating.  Drink plenty of fluids but you should avoid alcoholic beverages for 24  hours.  ACTIVITY:  You should plan to take it easy for the rest of today and you should NOT DRIVE or use heavy machinery until tomorrow (because of the sedation medicines used during the test).    FOLLOW UP: Our staff will call the number listed on your records the next business day following your procedure to check on you and address any questions or concerns that you may have regarding the information given to you following your procedure. If we do not reach you, we will leave a message.  However, if you are feeling well and you are not experiencing any problems, there is no need to return our call.  We will assume that you have returned to your regular daily activities without incident.  If any biopsies were taken you will be contacted by phone or by letter within the next 1-3 weeks.  Please call us at [phone] if you have not heard about the biopsies in 3 weeks.    SIGNATURES/CONFIDENTIALITY: You and/or your care partner have signed paperwork which will be entered into your electronic medical record.  These signatures attest to the fact that that the information above on your After Visit Summary has been reviewed and is understood.  Full responsibility of the confidentiality of this discharge information lies with you and/or your care-partner.   Await biopsy results  Continue Lialda 2.4 mg daily - reordered [facility]   Repeat Colonoscopy in 2 years

## 2014-10-04 NOTE — Progress Notes (Signed)
Report to PACU, RN, vss, BBS= Clear.  

## 2014-10-04 NOTE — Op Note (Signed)
[facility]  [address] [place], [id number]   COLONOSCOPY PROCEDURE REPORT  PATIENT: [name], [name]  MR#: [id number] BIRTHDATE: Aug 18, 1955 , 89  yrs. old GENDER: female ENDOSCOPIST: Jerene Bears, MD PROCEDURE DATE:  10/04/2014 PROCEDURE:   Colonoscopy, surveillance and Colonoscopy with biopsy First Screening Colonoscopy - Avg.  risk and is 50 yrs.  old or older - No.  Prior Negative Screening - Now for repeat screening. N/A  History of Adenoma - Now for follow-up colonoscopy & has been > or = to 3 yrs.  N/A  Polyps removed today? No Recommend repeat exam, <10 yrs? Yes other ASA CLASS:   Class II INDICATIONS: history of pan-ulcerative colitis diagnosed approximately 30 years ago, last surveillance colonoscopy May 2014 showing endoscopically mild colitis with mild to moderate chronic active colitis by histology throughout the colon . MEDICATIONS: Monitored anesthesia care and Propofol 240 mg IV  DESCRIPTION OF PROCEDURE:   After the risks benefits and alternatives of the procedure were thoroughly explained, informed consent was obtained.  The digital rectal exam revealed no rectal mass.   The LB [id number]  endoscope was introduced through the anus and advanced to the terminal ileum which was intubated for a short distance. No adverse events experienced.   The quality of the prep was good.  (Suprep was used)  The instrument was then slowly withdrawn as the colon was fully examined. Estimated blood loss is zero unless otherwise noted in this procedure report.    COLON FINDINGS: The examined terminal ileum appeared to be normal. The colonic mucosa appeared normal throughout the entire examined colon.  Today there was no evidence of active colitis. No polyps or tumors were seen.  Multiple surveillance biopsies using cold forceps were performed throughout the colon in 4 quadrant fashion every 10 cm cecum to rectum.  Retroflexed views revealed  small internal hemorrhoids. The time to cecum = 1.8 Withdrawal time = 11.6   The scope was withdrawn and the procedure completed. COMPLICATIONS: There were no immediate complications.  ENDOSCOPIC IMPRESSION: 1.   The examined terminal ileum appeared to be normal 2.   No evidence for active colitis today, colitis in endoscopic remission on mesalamine; multiple surveillance biopsies were performed using cold forceps  RECOMMENDATIONS: 1.  Await biopsy results 2.  Continue Lialda 2.4 g daily 3.  Repeat colonoscopy in 2 years for surveillance  eSigned:  Jerene Bears, MD 10/04/2014 3:15 PM   cc: [name] MD and The Patient   PATIENT NAME:  [name], [name] MR#: [id number]

## 2014-10-04 NOTE — Progress Notes (Signed)
Called to room to assist during endoscopic procedure.  Patient ID and intended procedure confirmed with present staff. Received instructions for my participation in the procedure from the performing physician.  

## 2014-10-07 ENCOUNTER — Telehealth: Payer: Self-pay

## 2014-10-07 NOTE — Telephone Encounter (Signed)
Left message on answering machine. 

## 2014-10-08 ENCOUNTER — Other Ambulatory Visit: Payer: Self-pay | Admitting: Internal Medicine

## 2014-10-08 ENCOUNTER — Encounter: Payer: Self-pay | Admitting: Internal Medicine

## 2014-10-08 ENCOUNTER — Telehealth: Payer: Self-pay | Admitting: *Deleted

## 2014-10-08 NOTE — Telephone Encounter (Signed)
Surgical clearance form received via fax from [facility]. Pt has surgery scheduled for 01/14/2015 for left:TKA-medial & lateral w/wo patella resurfacing.   Please call and schedule a surgical clearance appt with Dr. Birdie Riddle prior to surgery date. Thanks, JG//CMA

## 2014-10-09 ENCOUNTER — Other Ambulatory Visit: Payer: Self-pay

## 2014-10-09 DIAGNOSIS — Z1231 Encounter for screening mammogram for malignant neoplasm of breast: Secondary | ICD-10-CM

## 2014-10-09 NOTE — Telephone Encounter (Signed)
Scheduled for 11/11/14

## 2014-10-15 ENCOUNTER — Ambulatory Visit
Admission: RE | Admit: 2014-10-15 | Discharge: 2014-10-15 | Disposition: A | Discharge status: Home / Self Care | Payer: BLUE CROSS/BLUE SHIELD | Source: Ambulatory Visit

## 2014-10-15 DIAGNOSIS — Z1231 Encounter for screening mammogram for malignant neoplasm of breast: Secondary | ICD-10-CM

## 2014-11-11 ENCOUNTER — Encounter: Payer: Self-pay | Admitting: Family Medicine

## 2014-11-11 ENCOUNTER — Ambulatory Visit (INDEPENDENT_AMBULATORY_CARE_PROVIDER_SITE_OTHER): Payer: BLUE CROSS/BLUE SHIELD | Admitting: Family Medicine

## 2014-11-11 VITALS — BP 126/84 | HR 81 | Temp 98.1°F | Resp 16 | Ht 61.0 in | Wt 219.1 lb

## 2014-11-11 DIAGNOSIS — Z01818 Encounter for other preprocedural examination: Secondary | ICD-10-CM | POA: Diagnosis not present

## 2014-11-11 NOTE — Patient Instructions (Signed)
Schedule your complete physical in 6 months At this time, there is no reason not to proceed with your surgery Call with any questions or concerns If you want to join [place] at the new [place] office, any scheduled appointments will automatically transfer and we will see you at [address] [place] [address] [place], [place] [address]  Happy Early Rudene Anda! GOOD LUCK WITH SURGERY!!!

## 2014-11-11 NOTE — Progress Notes (Signed)
Subjective:    [name] is a 59 y.o. female who presents to the office today for a preoperative consultation at the request of surgeon Dr Alvan Dame who plans on performing L TKR on November 29. This consultation is requested for the specific conditions prompting preoperative evaluation (i.e. because of potential affect on operative risk): none. Planned anesthesia: general. The patient has the following known anesthesia issues: no previous complications. Patients bleeding risk: no recent abnormal bleeding and no remote history of abnormal bleeding. Patient does not have objections to receiving blood products if needed.  The following portions of the patient's history were reviewed and updated as appropriate: allergies, current medications, past family history, past medical history, past social history, past surgical history and problem list.  Review of Systems no CP, SOB, HAs, visual changes, abd pain, N/V, edema, changes to bowel/bladder habits, sycope    Objective:    BP 126/84 mmHg  Pulse 81  Temp(Src) 98.1 F (36.7 C) (Oral)  Resp 16  Ht 5' 1"  (1.549 m)  Wt 219 lb 2 oz (99.394 kg)  BMI 41.42 kg/m2  SpO2 96%  General Appearance:    Alert, cooperative, no distress, appears stated age  Head:    Normocephalic, without obvious abnormality, atraumatic  Eyes:    PERRL, conjunctiva/corneas clear, EOM's intact, fundi    benign, both eyes  Ears:    Normal TM's and external ear canals, both ears  Nose:   Nares normal, septum midline, mucosa normal, no drainage    or sinus tenderness  Throat:   Lips, mucosa, and tongue normal; teeth and gums normal  Neck:   Supple, symmetrical, trachea midline, no adenopathy;    thyroid:  no enlargement/tenderness/nodules  Back:     Symmetric, no curvature, ROM normal, no CVA tenderness  Lungs:     Clear to auscultation bilaterally, respirations unlabored  Chest Wall:    No tenderness or deformity   Heart:    Regular rate and rhythm, S1 and S2 normal, no  murmur, rub   or gallop  Breast Exam:    No tenderness, masses, or nipple abnormality  Abdomen:     Soft, non-tender, bowel sounds active all four quadrants,    no masses, no organomegaly  Genitalia:    Normal female without lesion, discharge or tenderness  Rectal:    Normal tone, normal prostate, no masses or tenderness;   guaiac negative stool  Extremities:   Extremities normal, atraumatic, no cyanosis or edema  Pulses:   2+ and symmetric all extremities  Skin:   Skin color, texture, turgor normal, no rashes or lesions  Lymph nodes:   Cervical, supraclavicular, and axillary nodes normal  Neurologic:   CNII-XII intact, normal strength, sensation and reflexes    throughout    Predictors of intubation difficulty:  Morbid obesity? yes - BMI 40  Anatomically abnormal facies? no  Prominent incisors? no  Receding mandible? no  Short, thick neck? no  Neck range of motion: normal  Dentition: No chipped, loose, or missing teeth.  Cardiographics ECG: normal sinus rhythm, no blocks or conduction defects, no ischemic changes  Lab Review  No visits with results within 6 Month(s) from this visit. Latest known visit with results is:  Appointment on 03/06/2014  Component Date Value  . Hgb A 03/06/2014 94.2*  . Hgb A2 Quant 03/06/2014 5.4*  . Hgb F Quant 03/06/2014 0.4   . Hgb S Quant 03/06/2014 0.0   . Hemoglobin Other 03/06/2014 0.0   . WBC  03/06/2014 6.8   . RBC 03/06/2014 5.73*  . Hemoglobin 03/06/2014 12.7   . HCT 03/06/2014 39.2   . MCV 03/06/2014 68.3 Repeated and verified X2.*  . MCHC 03/06/2014 32.5   . RDW 03/06/2014 15.1   . Platelets 03/06/2014 307.0   . Neutrophils Relative % 03/06/2014 52.7   . Lymphocytes Relative 03/06/2014 35.7   . Monocytes Relative 03/06/2014 5.9   . Eosinophils Relative 03/06/2014 5.3*  . Basophils Relative 03/06/2014 0.4   . Neutro Abs 03/06/2014 3.6   . Lymphs Abs 03/06/2014 2.4   . Monocytes Absolute 03/06/2014 0.4   . Eosinophils Absolute  03/06/2014 0.4   . Basophils Absolute 03/06/2014 0.0   . Sodium 03/06/2014 141   . Potassium 03/06/2014 4.3   . Chloride 03/06/2014 105   . CO2 03/06/2014 25   . Glucose, Bld 03/06/2014 89   . BUN 03/06/2014 16   . Creatinine, Ser 03/06/2014 0.76   . Total Bilirubin 03/06/2014 0.3   . Alkaline Phosphatase 03/06/2014 74   . AST 03/06/2014 22   . ALT 03/06/2014 19   . Total Protein 03/06/2014 7.6   . Albumin 03/06/2014 4.0   . Calcium 03/06/2014 9.7   . GFR 03/06/2014 101.15   . CRP, High Sensitivity 03/06/2014 9.300*  . Ferritin 03/06/2014 138.1       Assessment:      59 y.o. female with planned surgery as above.   Known risk factors for perioperative complications: None   Difficulty with intubation is not anticipated.  Cardiac Risk Estimation: low  Current medications which may produce withdrawal symptoms if withheld perioperatively: none      Plan:    1. Preoperative workup as follows ECG. 2. Change in medication regimen before surgery: none, continue medication regimen including morning of surgery, with sip of water. 3. Prophylaxis for cardiac events with perioperative beta-blockers: not indicated. 4. Invasive hemodynamic monitoring perioperatively: at the discretion of anesthesiologist. 5. Deep vein thrombosis prophylaxis postoperatively:regimen to be chosen by surgical team. 6. Surveillance for postoperative MI with ECG immediately postoperatively and on postoperative days 1 and 2 AND troponin levels 24 hours postoperatively and on day 4 or hospital discharge (whichever comes first): at the discretion of anesthesiologist. 7. Other measures: hold NSAIDs 7 days prior to surgery

## 2014-11-11 NOTE — Progress Notes (Signed)
Pre visit review using our clinic review tool, if applicable. No additional management support is needed unless otherwise documented below in the visit note. 

## 2014-11-11 NOTE — Telephone Encounter (Signed)
surgical clearance paperwork completed and faxed to [facility].

## 2014-11-13 ENCOUNTER — Telehealth: Payer: Self-pay | Admitting: Internal Medicine

## 2014-11-13 NOTE — Telephone Encounter (Signed)
Lialda discount card placed at front desk per patient request. Patient advised.

## 2014-11-15 ENCOUNTER — Telehealth: Payer: Self-pay | Admitting: Internal Medicine

## 2014-11-15 DIAGNOSIS — K51918 Ulcerative colitis, unspecified with other complication: Secondary | ICD-10-CM

## 2014-11-15 NOTE — Telephone Encounter (Signed)
Pt states she checked with her pharmacy and Doristine Johns is quite expensive. States her insurance company suggested mesalamine or balsalazide as alternatives. Pt wants to know if Dr. Hilarie Fredrickson thinks one of these could work for her. Please advise.

## 2014-11-17 NOTE — Telephone Encounter (Signed)
lialda is mesalamine, but there are many different forms Please check with her insurance/pharm coverage and advise which mesalamine is preferred Thanks

## 2014-11-18 ENCOUNTER — Other Ambulatory Visit: Payer: Self-pay

## 2014-11-18 MED ORDER — MESALAMINE 1.2 G PO TBEC: 2.4000 g | DELAYED_RELEASE_TABLET | Freq: Every day | ORAL | Status: DC

## 2014-11-18 NOTE — Telephone Encounter (Signed)
Spoke with pt and she will call her insurance company to see which mesalamine is preferred and call us back.

## 2014-11-18 NOTE — Telephone Encounter (Signed)
Pt called back and states that the generic for lialda needs to be sent in per her insurance company. [facility] Rx sent in and pt aware.

## 2015-01-01 NOTE — H&P (Signed)
TOTAL KNEE ADMISSION H&P  Patient is being admitted for left total knee arthroplasty.  Subjective:  Chief Complaint:    Left knee primary OA /pain  HPI: [name], 59 y.o. female, has a history of pain and functional disability in the left knee due to arthritis and has failed non-surgical conservative treatments for greater than 12 weeks to include NSAID's and/or analgesics, corticosteriod injections, viscosupplementation injections and activity modification.  Onset of symptoms was gradual, starting 7+ years ago with gradually worsening course since that time. The patient noted prior procedures on the knee to include  arthroscopy and menisectomy on the left knee(s).  Patient currently rates pain in the left knee(s) at 10 out of 10 with activity. Patient has night pain, worsening of pain with activity and weight bearing, pain that interferes with activities of daily living, pain with passive range of motion, crepitus and joint swelling.  Patient has evidence of periarticular osteophytes and joint space narrowing by imaging studies.  There is no active infection.  Risks, benefits and expectations were discussed with the patient.  Risks including but not limited to the risk of anesthesia, blood clots, nerve damage, blood vessel damage, failure of the prosthesis, infection and up to and including death.  Patient understand the risks, benefits and expectations and wishes to proceed with surgery.   PCP: [name], MD  D/C Plans:      Home with HHPT  Post-op Meds:       No Rx given   Tranexamic Acid:      To be given - IV  Decadron:      Is to be given  FYI:     ASA post-op  Norco post-op    Patient Active Problem List   Diagnosis Date Noted  . Thoracic back pain 04/05/2014  . Beta thalassemia, heterozygous 03/11/2014  . Right lumbar radiculopathy 11/23/2013  . Arm numbness 07/16/2013  . Right flank pain 12/20/2012  . Drowsiness 09/01/2012  . 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 Routine general medical examination at a health care facility 05/19/2012  . Sebaceous cyst 05/19/2012  . THYROIDITIS 01/27/2010  . MIGRAINE HEADACHE 01/27/2010  . ALLERGIC RHINITIS 12/31/2009  . ASTHMA 12/31/2009  . PALPITATIONS 12/31/2009  . SHORTNESS OF BREATH 12/31/2009   Past Medical History  Diagnosis Date  . Migraine   . Arthritis   . UC (ulcerative colitis) 06/2012    Colonoscopy with Dr. Hilarie Fredrickson  . Microcytic anemia   . GERD (gastroesophageal reflux disease)     Past Surgical History  Procedure Laterality Date  . Abdominal hysterectomy      partial  . Knee surgery  12-13,11-10  . Breast surgery    . Colonoscopy  20 + years ago    In [place].    No prescriptions prior to admission   No Known Allergies   Social History  Substance Use Topics  . Smoking status: Never Smoker   . Smokeless tobacco: Never Used  . Alcohol Use: No    Family History  Problem Relation Age of Onset  . Diabetes Brother   . Diabetes Daughter   . Sickle cell trait Mother   . Colon cancer Neg Hx   . Asthma       Review of Systems  Constitutional: Negative.   Eyes: Negative.   Respiratory: Positive for cough and shortness of breath (on exertion).   Cardiovascular: Negative.   Gastrointestinal: Negative.   Genitourinary: Negative.   Musculoskeletal: Positive for back pain  and joint pain.  Skin: Negative.   Neurological: Positive for headaches.  Endo/Heme/Allergies: Positive for environmental allergies.  Psychiatric/Behavioral: Negative.     Objective:  Physical Exam  Constitutional: She is oriented to person, place, and time. She appears well-developed and well-nourished.  HENT:  Head: Normocephalic and atraumatic.  Eyes: Pupils are equal, round, and reactive to light.  Neck: Neck supple. No JVD present. No tracheal deviation present. No thyromegaly present.  Cardiovascular: Normal rate, regular rhythm, normal heart sounds and intact distal pulses.    Respiratory: Effort normal and breath sounds normal. No stridor. No respiratory distress. She has no wheezes.  GI: Soft. There is no tenderness. There is no guarding.  Musculoskeletal:       Left knee: She exhibits decreased range of motion, swelling and bony tenderness. She exhibits no ecchymosis, no deformity, no laceration and no erythema. Tenderness found.  Lymphadenopathy:    She has no cervical adenopathy.  Neurological: She is alert and oriented to person, place, and time.  Skin: Skin is warm and dry.  Psychiatric: She has a normal mood and affect.      Labs:  Estimated body mass index is 40.65 kg/(m^2) as calculated from the following:   Height as of 10/04/14: 5' 1.25" (1.556 m).   Weight as of 10/04/14: 98.431 kg (217 lb).   Imaging Review Plain radiographs demonstrate severe degenerative joint disease of the left knee(s). The overall alignment is neutral. The bone quality appears to be good for age and reported activity level.  Assessment/Plan:  End stage arthritis, left knee   The patient history, physical examination, clinical judgment of the provider and imaging studies are consistent with end stage degenerative joint disease of the left knee(s) and total knee arthroplasty is deemed medically necessary. The treatment options including medical management, injection therapy arthroscopy and arthroplasty were discussed at length. The risks and benefits of total knee arthroplasty were presented and reviewed. The risks due to aseptic loosening, infection, stiffness, patella tracking problems, thromboembolic complications and other imponderables were discussed. The patient acknowledged the explanation, agreed to proceed with the plan and consent was signed. Patient is being admitted for inpatient treatment for surgery, pain control, PT, OT, prophylactic antibiotics, VTE prophylaxis, progressive ambulation and ADL's and discharge planning. The patient is planning to be discharged home  with home health services.      West Pugh Aislinn Feliz   PA-C  01/01/2015, 11:05 AM

## 2015-01-02 ENCOUNTER — Other Ambulatory Visit (HOSPITAL_COMMUNITY): Payer: Self-pay | Admitting: *Deleted

## 2015-01-02 NOTE — Patient Instructions (Addendum)
Shawnette Augello  01/02/2015   Your procedure is scheduled on: 01-14-15  Report to [facility]  Entrance take [facility]  elevators to 3rd floor to  [place] at 1120 AM.  Call this number if you have problems the morning of surgery [phone]   Remember: ONLY 1 PERSON MAY GO WITH YOU TO SHORT STAY TO GET  READY MORNING OF [place].  Do not eat food :After Midnight, clear liquids midnight until 820 am day of surgery, nothing by mouth after 820 am day of surgery.      Take these medicines the morning of surgery with A SIP OF WATER: none              You may not have any metal on your body including hair pins and              piercings  Do not wear jewelry, make-up, lotions, powders or perfumes, deodorant             Do not wear nail polish.  Do not shave  48 hours prior to surgery.              Men may shave face and neck.   Do not bring valuables to the hospital. [place].  Contacts, dentures or bridgework may not be worn into surgery.  Leave suitcase in the car. After surgery it may be brought to your room.     Patients discharged the day of surgery will not be allowed to drive home.  Name and phone number of your driver:  Special Instructions: practice deep breathing and leg exercises              Please read over the following fact sheets you were given: _____________________________________________________________________                CLEAR LIQUID DIET   Foods Allowed                                                                     Foods Excluded  Coffee and tea, regular and decaf                             liquids that you cannot  Plain Jell-O in any flavor                                             see through such as: Fruit ices (not with fruit pulp)                                     milk, soups, orange juice  Iced Popsicles  All solid  food Carbonated beverages, regular and diet                                    Cranberry, grape and apple juices Sports drinks like Gatorade Lightly seasoned clear broth or consume(fat free) Sugar, honey syrup  Sample Menu Breakfast                                Lunch                                     Supper Cranberry juice                    Beef broth                            Chicken broth Jell-O                                     Grape juice                           Apple juice Coffee or tea                        Jell-O                                      Popsicle                                                Coffee or tea                        Coffee or tea  _____________________________________________________________________  [facility] - Preparing for Surgery Before surgery, you can play an important role.  Because skin is not sterile, your skin needs to be as free of germs as possible.  You can reduce the number of germs on your skin by washing with CHG (chlorahexidine gluconate) soap before surgery.  CHG is an antiseptic cleaner which kills germs and bonds with the skin to continue killing germs even after washing. Please DO NOT use if you have an allergy to CHG or antibacterial soaps.  If your skin becomes reddened/irritated stop using the CHG and inform your nurse when you arrive at Short Stay. Do not shave (including legs and underarms) for at least 48 hours prior to the first CHG shower.  You may shave your face/neck. Please follow these instructions carefully:  1.  Shower with CHG Soap the night before surgery and the  morning of Surgery.  2.  If you choose to wash your hair, wash your hair first as usual with your  normal  shampoo.  3.  After you shampoo, rinse your hair and body thoroughly to remove the  shampoo.  4.  Use CHG as you would any other liquid soap.  You can apply chg directly  to the skin and wash                       Gently  with a scrungie or clean washcloth.  5.  Apply the CHG Soap to your body ONLY FROM THE NECK DOWN.   Do not use on face/ open                           Wound or open sores. Avoid contact with eyes, ears mouth and genitals (private parts).                       Wash face,  Genitals (private parts) with your normal soap.             6.  Wash thoroughly, paying special attention to the area where your surgery  will be performed.  7.  Thoroughly rinse your body with warm water from the neck down.  8.  DO NOT shower/wash with your normal soap after using and rinsing off  the CHG Soap.                9.  Pat yourself dry with a clean towel.            10.  Wear clean pajamas.            11.  Place clean sheets on your bed the night of your first shower and do not  sleep with pets. Day of Surgery : Do not apply any lotions/deodorants the morning of surgery.  Please wear clean clothes to the hospital/surgery center.  FAILURE TO FOLLOW THESE INSTRUCTIONS MAY RESULT IN THE CANCELLATION OF YOUR SURGERY PATIENT SIGNATURE_________________________________  NURSE SIGNATURE__________________________________  ________________________________________________________________________   Adam Phenix  An incentive spirometer is a tool that can help keep your lungs clear and active. This tool measures how well you are filling your lungs with each breath. Taking long deep breaths may help reverse or decrease the chance of developing breathing (pulmonary) problems (especially infection) following:  A long period of time when you are unable to move or be active. BEFORE THE PROCEDURE   If the spirometer includes an indicator to show your best effort, your nurse or respiratory therapist will set it to a desired goal.  If possible, sit up straight or lean slightly forward. Try not to slouch.  Hold the incentive spirometer in an upright position. INSTRUCTIONS FOR USE   Sit on the edge of your bed if  possible, or sit up as far as you can in bed or on a chair.  Hold the incentive spirometer in an upright position.  Breathe out normally.  Place the mouthpiece in your mouth and seal your lips tightly around it.  Breathe in slowly and as deeply as possible, raising the piston or the ball toward the top of the column.  Hold your breath for 3-5 seconds or for as long as possible. Allow the piston or ball to fall to the bottom of the column.  Remove the mouthpiece from your mouth and breathe out normally.  Rest for a few seconds and repeat Steps 1 through 7 at least 10 times every 1-2 hours when you are awake. Take your time and take a few normal breaths between deep breaths.  The spirometer may include an indicator to  show your best effort. Use the indicator as a goal to work toward during each repetition.  After each set of 10 deep breaths, practice coughing to be sure your lungs are clear. If you have an incision (the cut made at the time of surgery), support your incision when coughing by placing a pillow or rolled up towels firmly against it. Once you are able to get out of bed, walk around indoors and cough well. You may stop using the incentive spirometer when instructed by your caregiver.  RISKS AND COMPLICATIONS  Take your time so you do not get dizzy or light-headed.  If you are in pain, you may need to take or ask for pain medication before doing incentive spirometry. It is harder to take a deep breath if you are having pain. AFTER USE  Rest and breathe slowly and easily.  It can be helpful to keep track of a log of your progress. Your caregiver can provide you with a simple table to help with this. If you are using the spirometer at home, follow these instructions: [place] IF:   You are having difficultly using the spirometer.  You have trouble using the spirometer as often as instructed.  Your pain medication is not giving enough relief while using the  spirometer.  You develop fever of 100.5 F (38.1 C) or higher. SEEK IMMEDIATE MEDICAL CARE IF:   You cough up bloody sputum that had not been present before.  You develop fever of 102 F (38.9 C) or greater.  You develop worsening pain at or near the incision site. MAKE SURE YOU:   Understand these instructions.  Will watch your condition.  Will get help right away if you are not doing well or get worse. Document Released: 06/14/2006 Document Revised: 04/26/2011 Document Reviewed: 08/15/2006 Exit[employer] Patient Information 2014 ExitCare, [place].   ________________________________________________________________________  WHAT IS A BLOOD TRANSFUSION? Blood Transfusion Information  A transfusion is the replacement of blood or some of its parts. Blood is made up of multiple cells which provide different functions.  Red blood cells carry oxygen and are used for blood loss replacement.  White blood cells fight against infection.  Platelets control bleeding.  Plasma helps clot blood.  Other blood products are available for specialized needs, such as hemophilia or other clotting disorders. BEFORE THE TRANSFUSION  Who gives blood for transfusions?   Healthy volunteers who are fully evaluated to make sure their blood is safe. This is blood bank blood. Transfusion therapy is the safest it has ever been in the practice of medicine. Before blood is taken from a donor, a complete history is taken to make sure that person has no history of diseases nor engages in risky social behavior (examples are intravenous drug use or sexual activity with multiple partners). The donor's travel history is screened to minimize risk of transmitting infections, such as malaria. The donated blood is tested for signs of infectious diseases, such as HIV and hepatitis. The blood is then tested to be sure it is compatible with you in order to minimize the chance of a transfusion reaction. If you or a relative  donates blood, this is often done in anticipation of surgery and is not appropriate for emergency situations. It takes many days to process the donated blood. RISKS AND COMPLICATIONS Although transfusion therapy is very safe and saves many lives, the main dangers of transfusion include:   Getting an infectious disease.  Developing a transfusion reaction. This is an allergic reaction  to something in the blood you were given. Every precaution is taken to prevent this. The decision to have a blood transfusion has been considered carefully by your caregiver before blood is given. Blood is not given unless the benefits outweigh the risks. AFTER THE TRANSFUSION  Right after receiving a blood transfusion, you will usually feel much better and more energetic. This is especially true if your red blood cells have gotten low (anemic). The transfusion raises the level of the red blood cells which carry oxygen, and this usually causes an energy increase.  The nurse administering the transfusion will monitor you carefully for complications. HOME CARE INSTRUCTIONS  No special instructions are needed after a transfusion. You may find your energy is better. Speak with your caregiver about any limitations on activity for underlying diseases you may have. SEEK MEDICAL CARE IF:   Your condition is not improving after your transfusion.  You develop redness or irritation at the intravenous (IV) site. SEEK IMMEDIATE MEDICAL CARE IF:  Any of the following symptoms occur over the next 12 hours:  Shaking chills.  You have a temperature by mouth above 102 F (38.9 C), not controlled by medicine.  Chest, back, or muscle pain.  People around you feel you are not acting correctly or are confused.  Shortness of breath or difficulty breathing.  Dizziness and fainting.  You get a rash or develop hives.  You have a decrease in urine output.  Your urine turns a dark color or changes to pink, red, or brown. Any  of the following symptoms occur over the next 10 days:  You have a temperature by mouth above 102 F (38.9 C), not controlled by medicine.  Shortness of breath.  Weakness after normal activity.  The white part of the eye turns yellow (jaundice).  You have a decrease in the amount of urine or are urinating less often.  Your urine turns a dark color or changes to pink, red, or brown. Document Released: 01/30/2000 Document Revised: 04/26/2011 Document Reviewed: 09/18/2007 [facility] Patient Information 2014 [place], [place].  _______________________________________________________________________

## 2015-01-03 ENCOUNTER — Inpatient Hospital Stay (HOSPITAL_COMMUNITY): Admission: RE | Admit: 2015-01-03 | Payer: BLUE CROSS/BLUE SHIELD | Source: Ambulatory Visit

## 2015-01-03 ENCOUNTER — Encounter (HOSPITAL_COMMUNITY)
Admission: RE | Admit: 2015-01-03 | Discharge: 2015-01-03 | Disposition: A | Discharge status: Home / Self Care | Payer: BLUE CROSS/BLUE SHIELD | Source: Ambulatory Visit | Attending: Orthopedic Surgery | Admitting: Orthopedic Surgery

## 2015-01-03 ENCOUNTER — Encounter (HOSPITAL_COMMUNITY): Payer: Self-pay

## 2015-01-03 DIAGNOSIS — Z01812 Encounter for preprocedural laboratory examination: Secondary | ICD-10-CM | POA: Insufficient documentation

## 2015-01-03 LAB — URINALYSIS, ROUTINE W REFLEX MICROSCOPIC
Bilirubin Urine: NEGATIVE
Glucose, UA: NEGATIVE mg/dL
Hgb urine dipstick: NEGATIVE
KETONES UR: NEGATIVE mg/dL
NITRITE: NEGATIVE
PH: 6 (ref 5.0–8.0)
PROTEIN: NEGATIVE mg/dL
Specific Gravity, Urine: 1.016 (ref 1.005–1.030)

## 2015-01-03 LAB — BASIC METABOLIC PANEL
ANION GAP: 8 (ref 5–15)
BUN: 9 mg/dL (ref 6–20)
CALCIUM: 9.5 mg/dL (ref 8.9–10.3)
CO2: 27 mmol/L (ref 22–32)
CREATININE: 0.75 mg/dL (ref 0.44–1.00)
Chloride: 106 mmol/L (ref 101–111)
GFR calc Af Amer: >60 mL/min (ref >60)
GFR calc non Af Amer: >60 mL/min (ref >60)
GLUCOSE: 82 mg/dL (ref 65–99)
Potassium: 4.1 mmol/L (ref 3.5–5.1)
Sodium: 141 mmol/L (ref 135–145)

## 2015-01-03 LAB — URINE MICROSCOPIC-ADD ON: Bacteria, UA: NONE SEEN

## 2015-01-03 LAB — SURGICAL PCR SCREEN
MRSA, PCR: NEGATIVE
STAPHYLOCOCCUS AUREUS: NEGATIVE

## 2015-01-03 LAB — TYPE AND SCREEN
ABO/RH(D): B POS
Antibody Screen: NEGATIVE

## 2015-01-03 LAB — CBC
HCT: 38.7 % (ref 36.0–46.0)
HEMOGLOBIN: 12.4 g/dL (ref 12.0–15.0)
MCH: 22.3 pg — AB (ref 26.0–34.0)
MCHC: 32 g/dL (ref 30.0–36.0)
MCV: 69.5 fL — AB (ref 78.0–100.0)
Platelets: 273 10*3/uL (ref 150–400)
RBC: 5.57 MIL/uL — ABNORMAL HIGH (ref 3.87–5.11)
RDW: 14.8 % (ref 11.5–15.5)
WBC: 7 10*3/uL (ref 4.0–10.5)

## 2015-01-03 LAB — PROTIME-INR
INR: 1.06 (ref 0.00–1.49)
PROTHROMBIN TIME: 14 s (ref 11.6–15.2)

## 2015-01-03 LAB — APTT: aPTT: 32 seconds (ref 24–37)

## 2015-01-03 LAB — ABO/RH: ABO/RH(D): B POS

## 2015-01-03 NOTE — Pre-Procedure Instructions (Addendum)
EKG 11-10-24 on chart and epic Medical Clearance Dr Birdie Riddle on chart

## 2015-01-14 ENCOUNTER — Inpatient Hospital Stay (HOSPITAL_COMMUNITY): Payer: BLUE CROSS/BLUE SHIELD | Admitting: Certified Registered Nurse Anesthetist

## 2015-01-14 ENCOUNTER — Encounter (HOSPITAL_COMMUNITY)
Admission: RE | Disposition: A | Discharge status: Home Health | Payer: Self-pay | Source: Ambulatory Visit | Attending: Orthopedic Surgery

## 2015-01-14 ENCOUNTER — Encounter (HOSPITAL_COMMUNITY): Payer: Self-pay | Admitting: *Deleted

## 2015-01-14 ENCOUNTER — Inpatient Hospital Stay (HOSPITAL_COMMUNITY)
Admission: RE | Admit: 2015-01-14 | Discharge: 2015-01-16 | DRG: 470 | Disposition: A | Discharge status: Home Health | Payer: BLUE CROSS/BLUE SHIELD | Source: Ambulatory Visit | Attending: Orthopedic Surgery | Admitting: Orthopedic Surgery

## 2015-01-14 DIAGNOSIS — J45909 Unspecified asthma, uncomplicated: Secondary | ICD-10-CM | POA: Diagnosis present

## 2015-01-14 DIAGNOSIS — Z01812 Encounter for preprocedural laboratory examination: Secondary | ICD-10-CM | POA: Diagnosis not present

## 2015-01-14 DIAGNOSIS — K219 Gastro-esophageal reflux disease without esophagitis: Secondary | ICD-10-CM | POA: Diagnosis present

## 2015-01-14 DIAGNOSIS — M659 Synovitis and tenosynovitis, unspecified: Secondary | ICD-10-CM | POA: Diagnosis present

## 2015-01-14 DIAGNOSIS — E669 Obesity, unspecified: Secondary | ICD-10-CM | POA: Diagnosis present

## 2015-01-14 DIAGNOSIS — M1712 Unilateral primary osteoarthritis, left knee: Principal | ICD-10-CM | POA: Diagnosis present

## 2015-01-14 DIAGNOSIS — M25562 Pain in left knee: Secondary | ICD-10-CM | POA: Diagnosis present

## 2015-01-14 DIAGNOSIS — M5416 Radiculopathy, lumbar region: Secondary | ICD-10-CM | POA: Diagnosis present

## 2015-01-14 DIAGNOSIS — Z96652 Presence of left artificial knee joint: Secondary | ICD-10-CM

## 2015-01-14 DIAGNOSIS — Z6839 Body mass index (BMI) 39.0-39.9, adult: Secondary | ICD-10-CM

## 2015-01-14 DIAGNOSIS — Z96659 Presence of unspecified artificial knee joint: Secondary | ICD-10-CM

## 2015-01-14 HISTORY — PX: TOTAL KNEE ARTHROPLASTY: SHX125

## 2015-01-14 SURGERY — ARTHROPLASTY, KNEE, TOTAL
Anesthesia: Spinal | Site: Knee | Laterality: Left

## 2015-01-14 MED ORDER — SODIUM CHLORIDE 0.9 % IV SOLN
INTRAVENOUS | Status: DC
  Administered 2015-01-15: 05:00:00 via INTRAVENOUS
  Filled 2015-01-14 (×7): qty 1000

## 2015-01-14 MED ORDER — CHLORHEXIDINE GLUCONATE 4 % EX LIQD: 60.0000 mL | Freq: Once | CUTANEOUS | Status: DC

## 2015-01-14 MED ORDER — ONDANSETRON HCL 4 MG PO TABS: 4.0000 mg | ORAL_TABLET | Freq: Four times a day (QID) | ORAL | Status: DC | PRN

## 2015-01-14 MED ORDER — HYDROMORPHONE HCL 1 MG/ML IJ SOLN
0.5000 mg | INTRAMUSCULAR | Status: DC | PRN
  Administered 2015-01-14 – 2015-01-15 (×3): 1 mg via INTRAVENOUS
  Filled 2015-01-14 (×3): qty 1

## 2015-01-14 MED ORDER — KETOROLAC TROMETHAMINE 30 MG/ML IJ SOLN
INTRAMUSCULAR | Status: AC
  Filled 2015-01-14: qty 1

## 2015-01-14 MED ORDER — HYDROMORPHONE HCL 1 MG/ML IJ SOLN: 0.2500 mg | INTRAMUSCULAR | Status: DC | PRN

## 2015-01-14 MED ORDER — PHENOL 1.4 % MT LIQD: 1.0000 | OROMUCOSAL | Status: DC | PRN

## 2015-01-14 MED ORDER — SODIUM CHLORIDE 0.9 % IR SOLN
Status: DC | PRN
  Administered 2015-01-14: 1000 mL

## 2015-01-14 MED ORDER — DEXAMETHASONE SODIUM PHOSPHATE 10 MG/ML IJ SOLN
10.0000 mg | Freq: Once | INTRAMUSCULAR | Status: AC
  Administered 2015-01-15: 10 mg via INTRAVENOUS
  Filled 2015-01-14: qty 1

## 2015-01-14 MED ORDER — DIPHENHYDRAMINE HCL 25 MG PO CAPS: 25.0000 mg | ORAL_CAPSULE | Freq: Four times a day (QID) | ORAL | Status: DC | PRN

## 2015-01-14 MED ORDER — METOCLOPRAMIDE HCL 5 MG/ML IJ SOLN: 5.0000 mg | Freq: Three times a day (TID) | INTRAMUSCULAR | Status: DC | PRN

## 2015-01-14 MED ORDER — KETOROLAC TROMETHAMINE 30 MG/ML IJ SOLN
INTRAMUSCULAR | Status: DC | PRN
  Administered 2015-01-14: 30 mg

## 2015-01-14 MED ORDER — LIDOCAINE HCL (CARDIAC) 20 MG/ML IV SOLN
INTRAVENOUS | Status: DC | PRN
  Administered 2015-01-14: 50 mg via INTRAVENOUS

## 2015-01-14 MED ORDER — 0.9 % SODIUM CHLORIDE (POUR BTL) OPTIME
TOPICAL | Status: DC | PRN
  Administered 2015-01-14: 1000 mL

## 2015-01-14 MED ORDER — MAGNESIUM CITRATE PO SOLN: 1.0000 | Freq: Once | ORAL | Status: DC | PRN

## 2015-01-14 MED ORDER — BUPIVACAINE IN DEXTROSE 0.75-8.25 % IT SOLN
INTRATHECAL | Status: DC | PRN
  Administered 2015-01-14: 1.5 mL via INTRATHECAL

## 2015-01-14 MED ORDER — LACTATED RINGERS IV SOLN
INTRAVENOUS | Status: DC | PRN
Start: 2015-01-14 — End: 2015-01-14
  Administered 2015-01-14 (×2): via INTRAVENOUS

## 2015-01-14 MED ORDER — FENTANYL CITRATE (PF) 100 MCG/2ML IJ SOLN
INTRAMUSCULAR | Status: AC
  Filled 2015-01-14: qty 2

## 2015-01-14 MED ORDER — CELECOXIB 200 MG PO CAPS
200.0000 mg | ORAL_CAPSULE | Freq: Two times a day (BID) | ORAL | Status: DC
  Administered 2015-01-15 – 2015-01-16 (×3): 200 mg via ORAL
  Filled 2015-01-14 (×6): qty 1

## 2015-01-14 MED ORDER — DEXAMETHASONE SODIUM PHOSPHATE 10 MG/ML IJ SOLN
10.0000 mg | Freq: Once | INTRAMUSCULAR | Status: AC
  Administered 2015-01-14: 10 mg via INTRAVENOUS

## 2015-01-14 MED ORDER — SODIUM CHLORIDE 0.9 % IJ SOLN
INTRAMUSCULAR | Status: DC | PRN
  Administered 2015-01-14: 30 mL

## 2015-01-14 MED ORDER — TRANEXAMIC ACID 1000 MG/10ML IV SOLN
1000.0000 mg | Freq: Once | INTRAVENOUS | Status: AC
  Administered 2015-01-14: 1000 mg via INTRAVENOUS
  Filled 2015-01-14: qty 10

## 2015-01-14 MED ORDER — BUPIVACAINE-EPINEPHRINE (PF) 0.25% -1:200000 IJ SOLN
INTRAMUSCULAR | Status: DC | PRN
  Administered 2015-01-14: 30 mL

## 2015-01-14 MED ORDER — SUMATRIPTAN SUCCINATE 100 MG PO TABS: 100.0000 mg | ORAL_TABLET | ORAL | Status: DC | PRN

## 2015-01-14 MED ORDER — BUPIVACAINE-EPINEPHRINE (PF) 0.25% -1:200000 IJ SOLN
INTRAMUSCULAR | Status: AC
  Filled 2015-01-14: qty 30

## 2015-01-14 MED ORDER — CEFAZOLIN SODIUM-DEXTROSE 2-3 GM-% IV SOLR
INTRAVENOUS | Status: AC
  Filled 2015-01-14: qty 50

## 2015-01-14 MED ORDER — PROPOFOL 500 MG/50ML IV EMUL
INTRAVENOUS | Status: DC | PRN
  Administered 2015-01-14: 50 ug/kg/min via INTRAVENOUS

## 2015-01-14 MED ORDER — FERROUS SULFATE 325 (65 FE) MG PO TABS
325.0000 mg | ORAL_TABLET | Freq: Three times a day (TID) | ORAL | Status: DC
  Administered 2015-01-15 – 2015-01-16 (×5): 325 mg via ORAL
  Filled 2015-01-14 (×7): qty 1

## 2015-01-14 MED ORDER — CEFAZOLIN SODIUM-DEXTROSE 2-3 GM-% IV SOLR
2.0000 g | Freq: Four times a day (QID) | INTRAVENOUS | Status: AC
  Administered 2015-01-14 – 2015-01-15 (×2): 2 g via INTRAVENOUS
  Filled 2015-01-14 (×2): qty 50

## 2015-01-14 MED ORDER — MESALAMINE 1.2 G PO TBEC
2.4000 g | DELAYED_RELEASE_TABLET | Freq: Every day | ORAL | Status: DC
  Administered 2015-01-15 – 2015-01-16 (×2): 2.4 g via ORAL
  Filled 2015-01-14 (×3): qty 2

## 2015-01-14 MED ORDER — SODIUM CHLORIDE 0.9 % IJ SOLN
INTRAMUSCULAR | Status: AC
  Filled 2015-01-14: qty 50

## 2015-01-14 MED ORDER — HYDROCODONE-ACETAMINOPHEN 7.5-325 MG PO TABS
1.0000 | ORAL_TABLET | ORAL | Status: DC
  Administered 2015-01-14 – 2015-01-15 (×3): 2 via ORAL
  Filled 2015-01-14 (×3): qty 2

## 2015-01-14 MED ORDER — METHOCARBAMOL 500 MG PO TABS
500.0000 mg | ORAL_TABLET | Freq: Four times a day (QID) | ORAL | Status: DC | PRN
  Administered 2015-01-15: 500 mg via ORAL
  Filled 2015-01-14: qty 1

## 2015-01-14 MED ORDER — FENTANYL CITRATE (PF) 100 MCG/2ML IJ SOLN
INTRAMUSCULAR | Status: DC | PRN
  Administered 2015-01-14: 50 ug via INTRAVENOUS
  Administered 2015-01-14 (×2): 25 ug via INTRAVENOUS

## 2015-01-14 MED ORDER — DOCUSATE SODIUM 100 MG PO CAPS
100.0000 mg | ORAL_CAPSULE | Freq: Two times a day (BID) | ORAL | Status: DC
  Administered 2015-01-14 – 2015-01-16 (×4): 100 mg via ORAL

## 2015-01-14 MED ORDER — METHOCARBAMOL 1000 MG/10ML IJ SOLN
500.0000 mg | Freq: Four times a day (QID) | INTRAVENOUS | Status: DC | PRN
  Filled 2015-01-14: qty 5

## 2015-01-14 MED ORDER — MENTHOL 3 MG MT LOZG: 1.0000 | LOZENGE | OROMUCOSAL | Status: DC | PRN

## 2015-01-14 MED ORDER — POLYETHYLENE GLYCOL 3350 17 G PO PACK
17.0000 g | PACK | Freq: Two times a day (BID) | ORAL | Status: DC
  Administered 2015-01-15: 17 g via ORAL

## 2015-01-14 MED ORDER — PROPOFOL 10 MG/ML IV BOLUS
INTRAVENOUS | Status: AC
  Filled 2015-01-14: qty 40

## 2015-01-14 MED ORDER — ONDANSETRON HCL 4 MG/2ML IJ SOLN: 4.0000 mg | Freq: Four times a day (QID) | INTRAMUSCULAR | Status: DC | PRN

## 2015-01-14 MED ORDER — ASPIRIN EC 325 MG PO TBEC
325.0000 mg | DELAYED_RELEASE_TABLET | Freq: Two times a day (BID) | ORAL | Status: DC
  Administered 2015-01-15 – 2015-01-16 (×3): 325 mg via ORAL
  Filled 2015-01-14 (×6): qty 1

## 2015-01-14 MED ORDER — ONDANSETRON HCL 4 MG/2ML IJ SOLN
INTRAMUSCULAR | Status: DC | PRN
  Administered 2015-01-14: 4 mg via INTRAVENOUS

## 2015-01-14 MED ORDER — MIDAZOLAM HCL 5 MG/5ML IJ SOLN
INTRAMUSCULAR | Status: DC | PRN
  Administered 2015-01-14: 2 mg via INTRAVENOUS

## 2015-01-14 MED ORDER — BISACODYL 10 MG RE SUPP: 10.0000 mg | Freq: Every day | RECTAL | Status: DC | PRN

## 2015-01-14 MED ORDER — MIDAZOLAM HCL 2 MG/2ML IJ SOLN
INTRAMUSCULAR | Status: AC
  Filled 2015-01-14: qty 2

## 2015-01-14 MED ORDER — METOCLOPRAMIDE HCL 10 MG PO TABS: 5.0000 mg | ORAL_TABLET | Freq: Three times a day (TID) | ORAL | Status: DC | PRN

## 2015-01-14 MED ORDER — ALUM & MAG HYDROXIDE-SIMETH 200-200-20 MG/5ML PO SUSP: 30.0000 mL | ORAL | Status: DC | PRN

## 2015-01-14 MED ORDER — PROMETHAZINE HCL 25 MG/ML IJ SOLN: 6.2500 mg | INTRAMUSCULAR | Status: DC | PRN

## 2015-01-14 MED ORDER — CEFAZOLIN SODIUM-DEXTROSE 2-3 GM-% IV SOLR
2.0000 g | INTRAVENOUS | Status: AC
  Administered 2015-01-14: 2 g via INTRAVENOUS

## 2015-01-14 SURGICAL SUPPLY — 48 items
BAG DECANTER FOR FLEXI CONT (MISCELLANEOUS) IMPLANT
BAG ZIPLOCK 12X15 (MISCELLANEOUS) IMPLANT
BANDAGE ELASTIC 6 VELCRO ST LF (GAUZE/BANDAGES/DRESSINGS) ×2 IMPLANT
BLADE SAW SGTL 13.0X1.19X90.0M (BLADE) ×2 IMPLANT
BOWL SMART MIX CTS (DISPOSABLE) ×2 IMPLANT
CAPT KNEE TOTAL 3 ATTUNE ×2 IMPLANT
CEMENT HV SMART SET (Cement) ×4 IMPLANT
CLOTH BEACON ORANGE TIMEOUT ST (SAFETY) ×2 IMPLANT
CUFF TOURN SGL QUICK 34 (TOURNIQUET CUFF) ×1
CUFF TRNQT CYL 34X4X40X1 (TOURNIQUET CUFF) ×1 IMPLANT
DECANTER SPIKE VIAL GLASS SM (MISCELLANEOUS) ×2 IMPLANT
DRAPE U-SHAPE 47X51 STRL (DRAPES) ×2 IMPLANT
DRSG AQUACEL AG ADV 3.5X10 (GAUZE/BANDAGES/DRESSINGS) ×2 IMPLANT
DURAPREP 26ML APPLICATOR (WOUND CARE) ×4 IMPLANT
ELECT REM PT RETURN 9FT ADLT (ELECTROSURGICAL) ×2
ELECTRODE REM PT RTRN 9FT ADLT (ELECTROSURGICAL) ×1 IMPLANT
GLOVE BIOGEL M 7.0 STRL (GLOVE) ×4 IMPLANT
GLOVE BIOGEL PI IND STRL 7.5 (GLOVE) ×3 IMPLANT
GLOVE BIOGEL PI IND STRL 8.5 (GLOVE) ×1 IMPLANT
GLOVE BIOGEL PI INDICATOR 7.5 (GLOVE) ×3
GLOVE BIOGEL PI INDICATOR 8.5 (GLOVE) ×1
GLOVE ECLIPSE 8.0 STRL XLNG CF (GLOVE) ×2 IMPLANT
GLOVE ORTHO TXT STRL SZ7.5 (GLOVE) ×4 IMPLANT
GLOVE SURG SS PI 7.5 STRL IVOR (GLOVE) ×4 IMPLANT
GOWN STRL REUS W/ TWL LRG LVL4 (GOWN DISPOSABLE) ×2 IMPLANT
GOWN STRL REUS W/TWL LRG LVL3 (GOWN DISPOSABLE) ×2 IMPLANT
GOWN STRL REUS W/TWL LRG LVL4 (GOWN DISPOSABLE) ×2
GOWN STRL REUS W/TWL XL LVL3 (GOWN DISPOSABLE) ×2 IMPLANT
HANDPIECE INTERPULSE COAX TIP (DISPOSABLE) ×1
HOLDER FOLEY CATH W/STRAP (MISCELLANEOUS) ×2 IMPLANT
LIQUID BAND (GAUZE/BANDAGES/DRESSINGS) ×2 IMPLANT
MANIFOLD NEPTUNE II (INSTRUMENTS) ×2 IMPLANT
NS IRRIG 1000ML POUR BTL (IV SOLUTION) ×2 IMPLANT
PACK TOTAL KNEE CUSTOM (KITS) ×2 IMPLANT
POSITIONER SURGICAL ARM (MISCELLANEOUS) ×2 IMPLANT
SET HNDPC FAN SPRY TIP SCT (DISPOSABLE) ×1 IMPLANT
SET PAD KNEE POSITIONER (MISCELLANEOUS) ×2 IMPLANT
SUCTION FRAZIER 12FR DISP (SUCTIONS) IMPLANT
SUT MNCRL AB 4-0 PS2 18 (SUTURE) ×2 IMPLANT
SUT VIC AB 1 CT1 36 (SUTURE) ×2 IMPLANT
SUT VIC AB 2-0 CT1 27 (SUTURE) ×3
SUT VIC AB 2-0 CT1 TAPERPNT 27 (SUTURE) ×3 IMPLANT
SUT VLOC 180 0 24IN GS25 (SUTURE) ×4 IMPLANT
SYR 50ML LL SCALE MARK (SYRINGE) ×2 IMPLANT
TRAY FOLEY W/METER SILVER 14FR (SET/KITS/TRAYS/PACK) ×2 IMPLANT
WATER STERILE IRR 1500ML POUR (IV SOLUTION) ×2 IMPLANT
WRAP KNEE MAXI GEL POST OP (GAUZE/BANDAGES/DRESSINGS) ×2 IMPLANT
YANKAUER SUCT BULB TIP 10FT TU (MISCELLANEOUS) ×2 IMPLANT

## 2015-01-14 NOTE — Anesthesia Preprocedure Evaluation (Addendum)
Anesthesia Evaluation  Patient identified by MRN, date of birth, ID band Patient awake    Reviewed: Allergy & Precautions, NPO status , Patient's Chart, lab work & pertinent test results  Airway Mallampati: II  TM Distance: >3 FB Neck ROM: Full    Dental no notable dental hx.    Pulmonary shortness of breath, asthma ,    Pulmonary exam normal breath sounds clear to auscultation       Cardiovascular negative cardio ROS Normal cardiovascular exam Rhythm:Regular Rate:Normal     Neuro/Psych  Headaches, Right flank pain Right lumbar radiculopathy Thoracic back pain. Arm numbnessl  Neuromuscular disease negative psych ROS   GI/Hepatic Neg liver ROS, PUD, GERD  ,  Endo/Other  negative endocrine ROS  Renal/GU negative Renal ROS  negative genitourinary   Musculoskeletal  (+) Arthritis ,   Abdominal (+) + obese,   Peds negative pediatric ROS (+)  Hematology  (+) anemia , Beta thalassemia, heterozygous  HGB 12.4  She states her thalassemia is asymptomatic and requires no treatment.   Anesthesia Other Findings   Reproductive/Obstetrics negative OB ROS                           Anesthesia Physical Anesthesia Plan  ASA: II  Anesthesia Plan: Spinal   Post-op Pain Management:    Induction: Intravenous  Airway Management Planned:   Additional Equipment:   Intra-op Plan:   Post-operative Plan: Extubation in OR  Informed Consent: I have reviewed the patients History and Physical, chart, labs and discussed the procedure including the risks, benefits and alternatives for the proposed anesthesia with the patient or authorized representative who has indicated his/her understanding and acceptance.   Dental advisory given  Plan Discussed with: CRNA  Anesthesia Plan Comments: (Discussed risks and benefits of and differences between spinal and general. Discussed risks of spinal including  headache, backache, failure, bleeding and hematoma, infection, and nerve damage. Patient consents to spinal. Questions answered. Coagulation studies and platelet count acceptable.  I believe the benefits of spinal outweigh risks in this patient.)      Anesthesia Quick Evaluation

## 2015-01-14 NOTE — Discharge Instructions (Signed)

## 2015-01-14 NOTE — Transfer of Care (Signed)
Immediate Anesthesia Transfer of Care Note  Patient: [name]  Procedure(s) Performed: Procedure(s): TOTAL KNEE ARTHROPLASTY (Left)  Patient Location: PACU  Anesthesia Type:Spinal  Level of Consciousness:  sedated, patient cooperative and responds to stimulation  Airway & Oxygen Therapy:Patient Spontanous Breathing and Patient connected to face mask oxgen  Post-op Assessment:  Report given to PACU RN and Post -op Vital signs reviewed and stable  Post vital signs:  Reviewed and stable  Last Vitals:  Filed Vitals:   01/14/15 1140  BP: 121/82  Pulse: 68  Temp: 36.7 C  Resp: 18    Complications: No apparent anesthesia complications

## 2015-01-14 NOTE — Op Note (Signed)
NAME:  [name]                      MEDICAL RECORD NO.:  [id number]                             FACILITY:  [facility]      PHYSICIAN:  Pietro Cassis. Alvan Dame, M.D.  DATE OF BIRTH:  [birth year]      DATE OF PROCEDURE:  01/14/2015                                     OPERATIVE REPORT         PREOPERATIVE DIAGNOSIS:  Left knee osteoarthritis.      POSTOPERATIVE DIAGNOSIS:  Left knee osteoarthritis.      FINDINGS:  The patient was noted to have complete loss of cartilage and   bone-on-bone arthritis with associated osteophytes in the medial and patellofemoral compartments of   the knee with a significant synovitis and associated effusion.      PROCEDURE:  Left total knee replacement.      COMPONENTS USED:  DePuy Attune rotating platform posterior stabilized knee   system, a size 4N femur, 3 tibia, size 6 mm PS AOX insert, and 32 anatomic patellar   button.      SURGEON:  Pietro Cassis. Alvan Dame, M.D.      ASSISTANT:  [name], PA-C.      ANESTHESIA:  Spinal.      SPECIMENS:  None.      COMPLICATION:  None.      DRAINS:  none.  EBL: <100      TOURNIQUET TIME:   Total Tourniquet Time Documented: Thigh (Left) - 28 minutes Total: Thigh (Left) - 28 minutes     The patient was stable to the recovery room.      INDICATION FOR PROCEDURE:  [name] is a 59 y.o. female patient of   mine.  The patient had been seen, evaluated, and treated conservatively in the   office with medication, activity modification, and injections.  The patient had   radiographic changes of bone-on-bone arthritis with endplate sclerosis and osteophytes noted.      The patient failed conservative measures including medication, injections, and activity modification, and at this point was ready for more definitive measures.   Based on the radiographic changes and failed conservative measures, the patient   decided to proceed with total knee replacement.  Risks of infection,   DVT, component failure, need  for revision surgery, postop course, and   expectations were all   discussed and reviewed.  Consent was obtained for benefit of pain   relief.      PROCEDURE IN DETAIL:  The patient was brought to the operative theater.   Once adequate anesthesia, preoperative antibiotics, 2 gm of Ancef, 1 gm of Tranexamic Acid, and 10 mg of Decadron administered, the patient was positioned supine with the left thigh tourniquet placed.  The  left lower extremity was prepped and draped in sterile fashion.  A time-   out was performed identifying the patient, planned procedure, and   extremity.      The left lower extremity was placed in the [facility] leg holder.  The leg was   exsanguinated, tourniquet elevated to 250 mmHg.  A midline incision was   made followed by median parapatellar arthrotomy.  Following initial   exposure, attention was first directed to the patella.  Precut   measurement was noted to be 21 mm.  I resected down to 13-14 mm and used a   32 anatomic patellar button to restore patellar height as well as cover the cut   surface.      The lug holes were drilled and a metal shim was placed to protect the   patella from retractors and saw blades.      At this point, attention was now directed to the femur.  The femoral   canal was opened with a drill, irrigated to try to prevent fat emboli.  An   intramedullary rod was passed at 3 degrees valgus, 9 mm of bone was   resected off the distal femur.  Following this resection, the tibia was   subluxated anteriorly.  Using the extramedullary guide, 3 mm of bone was resected off   the proximal medial tibia.  We confirmed the gap would be   stable medially and laterally with a size 6 mm insert as well as confirmed   the cut was perpendicular in the coronal plane, checking with an alignment rod.      Once this was done, I sized the femur to be a size 4 in the anterior-   posterior dimension, chose a narrow component based on medial and   lateral  dimension.  The size 4 rotation block was then pinned in   position anterior referenced using the C-clamp to set rotation.  The   anterior, posterior, and  chamfer cuts were made without difficulty nor   notching making certain that I was along the anterior cortex to help   with flexion gap stability.      The final box cut was made off the lateral aspect of distal femur.      At this point, the tibia was sized to be a size 3, the size 3 tray was   then pinned in position through the medial third of the tubercle,   drilled, and keel punched.  Trial reduction was now carried with a 4 N femur,  3 tibia, a size 6 mm insert, and the 32 patella botton.  The knee was brought to   extension, full extension with good flexion stability with the patella   tracking through the trochlea without application of pressure.  Femoral lug holes drilled.  Given   all these findings, the trial components removed.  Final components were   opened and cement was mixed.  The knee was irrigated with normal saline   solution and pulse lavage.  The synovial lining was   then injected with 30 cc of 0.25% Marcaine with epinephrine and 1 cc of Toradol plus 30 cc of NS for a   total of 61 cc.      The knee was irrigated.  Final implants were then cemented onto clean and   dried cut surfaces of bone with the knee brought to extension with a size 6 mm trial insert.      Once the cement had fully cured, the excess cement was removed   throughout the knee.  I confirmed I was satisfied with the range of   motion and stability, and the final size 6 mm PS AOX insert was chosen.  It was   placed into the knee.      The tourniquet had been let down at 28 minutes.  No significant   hemostasis required.  The   extensor mechanism was then reapproximated using #1 Vicryl and #0 V-lock sutures with the knee   in flexion.  The   remaining wound was closed with 2-0 Vicryl and running 4-0 Monocryl.   The knee was cleaned, dried,  dressed sterilely using Dermabond and   Aquacel dressing.  The patient was then   brought to recovery room in stable condition, tolerating the procedure   well.   Please note that Physician Assistant, [name], PA-C, was present for the entirety of the case, and was utilized for pre-operative positioning, peri-operative retractor management, general facilitation of the procedure.  He was also utilized for primary wound closure at the end of the case.              Pietro Cassis Alvan Dame, M.D.    01/14/2015 5:12 PM

## 2015-01-14 NOTE — Progress Notes (Signed)
Floor notified pt will be aqrriving in 20 minutes to 1613, needs pump and pulsox.

## 2015-01-14 NOTE — Anesthesia Postprocedure Evaluation (Signed)
Anesthesia Post Note  Patient: [name]  Procedure(s) Performed: Procedure(s) (LRB): TOTAL KNEE ARTHROPLASTY (Left)  Patient location during evaluation: PACU Anesthesia Type: Spinal Level of consciousness: awake and alert Pain management: pain level controlled Vital Signs Assessment: post-procedure vital signs reviewed and stable Respiratory status: spontaneous breathing, nonlabored ventilation, respiratory function stable and patient connected to nasal cannula oxygen Cardiovascular status: blood pressure returned to baseline and stable Postop Assessment: no signs of nausea or vomiting Anesthetic complications: no    Last Vitals:  Filed Vitals:   01/14/15 1823 01/14/15 1930  BP: 109/65 125/70  Pulse: 60 66  Temp: 36.7 C 36.7 C  Resp: 14 14    Last Pain:  Filed Vitals:   01/14/15 1935  PainSc: 0-No pain    LLE Motor Response: Purposeful movement LLE Sensation: Decreased RLE Motor Response: Purposeful movement RLE Sensation: Decreased L Sensory Level: S1-Sole of foot, small toes R Sensory Level: S1-Sole of foot, small toes  Reniah Cottingham L

## 2015-01-14 NOTE — Interval H&P Note (Signed)
History and Physical Interval Note:  01/14/2015 1:08 PM  [name]  has presented today for surgery, with the diagnosis of LEFT KNEE OA  The various methods of treatment have been discussed with the patient and family. After consideration of risks, benefits and other options for treatment, the patient has consented to  Procedure(s): TOTAL KNEE ARTHROPLASTY (Left) as a surgical intervention .  The patient's history has been reviewed, patient examined, no change in status, stable for surgery.  I have reviewed the patient's chart and labs.  Questions were answered to the patient's satisfaction.     Mauri Pole

## 2015-01-15 ENCOUNTER — Encounter (HOSPITAL_COMMUNITY): Payer: Self-pay | Admitting: Orthopedic Surgery

## 2015-01-15 LAB — BASIC METABOLIC PANEL
Anion gap: 7 (ref 5–15)
BUN: 10 mg/dL (ref 6–20)
CALCIUM: 9.2 mg/dL (ref 8.9–10.3)
CO2: 27 mmol/L (ref 22–32)
CREATININE: 0.76 mg/dL (ref 0.44–1.00)
Chloride: 104 mmol/L (ref 101–111)
GFR calc non Af Amer: >60 mL/min (ref >60)
Glucose, Bld: 127 mg/dL — ABNORMAL HIGH (ref 65–99)
Potassium: 4.3 mmol/L (ref 3.5–5.1)
SODIUM: 138 mmol/L (ref 135–145)

## 2015-01-15 LAB — CBC
HEMATOCRIT: 34.9 % — AB (ref 36.0–46.0)
Hemoglobin: 11 g/dL — ABNORMAL LOW (ref 12.0–15.0)
MCH: 21.8 pg — ABNORMAL LOW (ref 26.0–34.0)
MCHC: 31.5 g/dL (ref 30.0–36.0)
MCV: 69.2 fL — ABNORMAL LOW (ref 78.0–100.0)
PLATELETS: 255 10*3/uL (ref 150–400)
RBC: 5.04 MIL/uL (ref 3.87–5.11)
RDW: 14.7 % (ref 11.5–15.5)
WBC: 15 10*3/uL — ABNORMAL HIGH (ref 4.0–10.5)

## 2015-01-15 MED ORDER — ACETAMINOPHEN 500 MG PO TABS
1000.0000 mg | ORAL_TABLET | Freq: Three times a day (TID) | ORAL | Status: DC
  Administered 2015-01-15 – 2015-01-16 (×4): 1000 mg via ORAL
  Filled 2015-01-15 (×5): qty 2

## 2015-01-15 MED ORDER — FERROUS SULFATE 325 (65 FE) MG PO TABS: 325.0000 mg | ORAL_TABLET | Freq: Three times a day (TID) | ORAL | Status: DC

## 2015-01-15 MED ORDER — POLYETHYLENE GLYCOL 3350 17 G PO PACK: 17.0000 g | PACK | Freq: Two times a day (BID) | ORAL | Status: DC

## 2015-01-15 MED ORDER — ASPIRIN 325 MG PO TBEC: 325.0000 mg | DELAYED_RELEASE_TABLET | Freq: Two times a day (BID) | ORAL | Status: AC

## 2015-01-15 MED ORDER — DOCUSATE SODIUM 100 MG PO CAPS: 100.0000 mg | ORAL_CAPSULE | Freq: Two times a day (BID) | ORAL | Status: DC

## 2015-01-15 MED ORDER — METHOCARBAMOL 500 MG PO TABS: 500.0000 mg | ORAL_TABLET | Freq: Four times a day (QID) | ORAL | Status: DC | PRN

## 2015-01-15 MED ORDER — OXYCODONE HCL 5 MG PO TABS
5.0000 mg | ORAL_TABLET | ORAL | Status: DC
  Administered 2015-01-15 – 2015-01-16 (×8): 10 mg via ORAL
  Filled 2015-01-15 (×8): qty 2

## 2015-01-15 MED ORDER — ACETAMINOPHEN 500 MG PO TABS: 1000.0000 mg | ORAL_TABLET | Freq: Three times a day (TID) | ORAL | Status: DC

## 2015-01-15 MED ORDER — OXYCODONE HCL 5 MG PO TABS: 5.0000 mg | ORAL_TABLET | ORAL | Status: DC | PRN

## 2015-01-15 NOTE — Evaluation (Signed)
Occupational Therapy Evaluation Patient Details Name: [name] MRN: [id number] DOB: [birth year] Today's Date: 01/15/2015    History of Present Illness s/p L TKA   Clinical Impression   This 59 year old female was admitted for the above surgery.  All education was completed.  No further OT is needed at this time.    Follow Up Recommendations  No OT follow up    Equipment Recommendations  3 in 1 bedside comode (delivered)    Recommendations for Other Services       Precautions / Restrictions Precautions Precautions: Knee Restrictions Weight Bearing Restrictions: No      Mobility Bed Mobility Overal bed mobility: Needs Assistance Bed Mobility: Supine to Sit     Supine to sit: Min assist     General bed mobility comments: assist for LLE; cues for sequence  Transfers Overall transfer level: Needs assistance Equipment used: Rolling walker (2 wheeled) Transfers: Sit to/from Stand Sit to Stand: Min assist         General transfer comment: steadying assistance; cues for UE/LE placement    Balance                                            ADL Overall ADL's : Needs assistance/impaired     Grooming: Wash/dry hands;Wash/dry face;Sitting;Set up   Upper Body Bathing: Set up;Sitting   Lower Body Bathing: Minimal assistance;Sit to/from stand   Upper Body Dressing : Set up;Sitting   Lower Body Dressing: Moderate assistance;Sit to/from stand   Toilet Transfer: Minimal assistance;Ambulation;RW;BSC   Toileting- Clothing Manipulation and Hygiene: Minimal assistance;Sit to/from stand         General ADL Comments: completed ADL from commode in bathroom.  Educated on tub bench vs tub readiness.  If she decides to get tub bench, she has a daughter who works for [employer].  Pt plans to have 24/7 assistance initially.     Vision     Perception     Praxis      Pertinent Vitals/Pain Pain Assessment: 0-10 Pain Score: 5  Pain  Location: L knee Pain Descriptors / Indicators: Aching Pain Intervention(s): Limited activity within patient's tolerance;Monitored during session;Premedicated before session;Repositioned     Hand Dominance     Extremity/Trunk Assessment Upper Extremity Assessment Upper Extremity Assessment: Overall WFL for tasks assessed           Communication Communication Communication: No difficulties   Cognition Arousal/Alertness: Awake/alert Behavior During Therapy: WFL for tasks assessed/performed Overall Cognitive Status: Within Functional Limits for tasks assessed                     General Comments       Exercises       Shoulder Instructions      Home Living Family/patient expects to be discharged to:: Private residence   Available Help at Discharge: Family;Available 24 hours/day               Bathroom Shower/Tub: Tub/shower unit Shower/tub characteristics: Architectural technologist: Handicapped height                Prior Functioning/Environment Level of Independence: Independent             OT Diagnosis: Acute pain   OT Problem List:     OT Treatment/Interventions:      OT Goals(Current goals can be found in the  care plan section) Acute Rehab OT Goals Patient Stated Goal: get back to being independent  OT Frequency:     Barriers to D/C:            Co-evaluation              End of Session    Activity Tolerance: Patient tolerated treatment well Patient left: in chair;with call bell/phone within reach (with PT)   Time: 3353-3174 OT Time Calculation (min): 29 min Charges:  OT General Charges $OT Visit: 1 Procedure OT Evaluation $Initial OT Evaluation Tier I: 1 Procedure OT Treatments $Self Care/Home Management : 8-22 mins G-Codes:    Tavares Levinson 2015/02/02, 12:08 PM  Lesle Chris, OTR/L [phone] 2015-02-02

## 2015-01-15 NOTE — Evaluation (Signed)
Physical Therapy Evaluation Patient Details Name: [name] MRN: [id number] DOB: [birth year] Today's Date: 01/15/2015   History of Present Illness  s/p L TKA  Clinical Impression  Pt s/p L TKR presents with decreased L LE strength/ROM and post op pain limiting functional mobility.  Pt should progress to dc home with assist of family/friends and HHPT follow up.    Follow Up Recommendations Home health PT    Equipment Recommendations  Rolling walker with 5" wheels    Recommendations for Other Services OT consult     Precautions / Restrictions Precautions Precautions: Knee;Fall Restrictions Weight Bearing Restrictions: No Other Position/Activity Restrictions: WBAT      Mobility  Bed Mobility Overal bed mobility: Needs Assistance Bed Mobility: Supine to Sit     Supine to sit: Min assist     General bed mobility comments: Pt OOB with OT  Transfers Overall transfer level: Needs assistance Equipment used: Rolling walker (2 wheeled) Transfers: Sit to/from Stand Sit to Stand: Min assist         General transfer comment: steadying assistance; cues for UE/LE placement  Ambulation/Gait Ambulation/Gait assistance: Min assist Ambulation Distance (Feet): 22 Feet Assistive device: Rolling walker (2 wheeled) Gait Pattern/deviations: Step-to pattern;Decreased step length - right;Decreased step length - left;Shuffle;Trunk flexed Gait velocity: decr Gait velocity interpretation: Below normal speed for age/gender General Gait Details: cues for sequence, posture and position from [employer]            Wheelchair Mobility    Modified Rankin (Stroke Patients Only)       Balance                                             Pertinent Vitals/Pain Pain Assessment: 0-10 Pain Score: 5  Pain Location: L knee Pain Descriptors / Indicators: Aching;Sore Pain Intervention(s): Limited activity within patient's tolerance;Monitored during  session;Premedicated before session;Ice applied    Home Living Family/patient expects to be discharged to:: Private residence Living Arrangements: Other relatives;Non-relatives/Friends Available Help at Discharge: Family;Available 24 hours/day Type of Home: House Home Access: Stairs to enter Entrance Stairs-Rails: Psychiatric nurse of Steps: 16 Home Layout: One level Home Equipment: None      Prior Function Level of Independence: Independent               Hand Dominance        Extremity/Trunk Assessment   Upper Extremity Assessment: Overall WFL for tasks assessed           Lower Extremity Assessment: LLE deficits/detail   LLE Deficits / Details: 3/5 quads with pt performing IND SLR; AAROM at knee -10 - 45  Cervical / Trunk Assessment: Normal  Communication   Communication: No difficulties  Cognition Arousal/Alertness: Awake/alert Behavior During Therapy: WFL for tasks assessed/performed Overall Cognitive Status: Within Functional Limits for tasks assessed                      General Comments      Exercises Total Joint Exercises Ankle Circles/Pumps: AROM;Both;15 reps;Supine Quad Sets: AROM;Both;10 reps;Supine Heel Slides: AAROM;15 reps;Supine;Left Straight Leg Raises: AAROM;AROM;Left;15 reps;Supine      Assessment/Plan    PT Assessment Patient needs continued PT services  PT Diagnosis Difficulty walking   PT Problem List Decreased strength;Decreased range of motion;Decreased activity tolerance;Decreased mobility;Decreased knowledge of use of DME;Pain  PT Treatment Interventions DME instruction;Gait  training;Stair training;Functional mobility training;Therapeutic activities;Therapeutic exercise;Patient/family education   PT Goals (Current goals can be found in the Care Plan section) Acute Rehab PT Goals Patient Stated Goal: get back to being independent PT Goal Formulation: With patient Time For Goal Achievement:  01/18/15 Potential to Achieve Goals: Good    Frequency 7X/week   Barriers to discharge        Co-evaluation               End of Session Equipment Utilized During Treatment: Gait belt Activity Tolerance: Patient tolerated treatment well Patient left: in chair;with call bell/phone within reach Nurse Communication: Mobility status         Time: 2257-5051 PT Time Calculation (min) (ACUTE ONLY): 28 min   Charges:   PT Evaluation $Initial PT Evaluation Tier I: 1 Procedure PT Treatments $Therapeutic Exercise: 8-22 mins   PT G Codes:        [name] January 26, 2015, 12:33 PM

## 2015-01-15 NOTE — Progress Notes (Signed)
     Subjective: 1 Day Post-Op Procedure(s) (LRB): TOTAL KNEE ARTHROPLASTY (Left)   Patient reports pain as moderate, pain not fully controlled. No events throughout the night.   Objective:   VITALS:   Filed Vitals:   01/15/15 0156 01/15/15 0451  BP: 111/57 104/59  Pulse: 60 58  Temp: 97.4 F (36.3 C) 97.8 F (36.6 C)  Resp: 15 14    Dorsiflexion/Plantar flexion intact Incision: dressing C/D/I No cellulitis present Compartment soft  LABS  Recent Labs  01/15/15 0525  HGB 11.0*  HCT 34.9*  WBC 15.0*  PLT 255     Recent Labs  01/15/15 0525  NA 138  K 4.3  BUN 10  CREATININE 0.76  GLUCOSE 127*     Assessment/Plan: 1 Day Post-Op Procedure(s) (LRB): TOTAL KNEE ARTHROPLASTY (Left) Foley cath d/c'ed Changed from Norco to Oxycodone and APAP [facility] diet Up with therapy D/C IV fluids Discharge home with home health, eventually when ready  Obese (BMI 30-39.9) Estimated body mass index is 39.23 kg/(m^2) as calculated from the following:   Height as of this encounter: 5' 1.5" (1.562 m).   Weight as of this encounter: 95.709 kg (211 lb). Patient also counseled that weight may inhibit the healing process Patient counseled that losing weight will help with future health issues        [name]. [name]   PAC  01/15/2015, 9:02 AM

## 2015-01-15 NOTE — Care Management Note (Signed)
Case Management Note  Patient Details  Name: [name] MRN: [id number] Date of Birth: 1955-03-24  Subjective/Objective:    S/p Left total knee replacement                Action/Plan: Discharge planning, spoke with patient at bedside. Patient has family coming to assist at d/c. Has chosen [place] for [facility] services. Needs a RW, contacted [facility] to deliver to room. Contacted [facility] for [facility] referral.   Expected Discharge Date:                  Expected Discharge Plan:  [place]  In-House Referral:  NA  Discharge planning Services  CM Consult  Post Acute Care Choice:  Home Health, Durable Medical Equipment Choice offered to:  Patient  DME Arranged:  Walker rolling DME Agency:  [place] Arranged:  PT [place]:  [place]  Status of Service:  Completed, signed off  Medicare Important Message Given:    Date Medicare IM Given:    Medicare IM give by:    Date Additional Medicare IM Given:    Additional Medicare Important Message give by:     If discussed at [place] of Stay Meetings, dates discussed:    Additional Comments:  Guadalupe Maple, RN 01/15/2015, 10:43 AM

## 2015-01-15 NOTE — Progress Notes (Signed)
Physical Therapy Treatment Patient Details Name: [name] MRN: [id number] DOB: [birth year] Today's Date: 2015-01-20    History of Present Illness s/p L TKA    PT Comments    Pt progressing with mobility.  Therex deferred to after next pain meds  Follow Up Recommendations  Home health PT     Equipment Recommendations  Rolling walker with 5" wheels    Recommendations for Other Services OT consult     Precautions / Restrictions Precautions Precautions: Knee;Fall Restrictions Weight Bearing Restrictions: No Other Position/Activity Restrictions: WBAT    Mobility  Bed Mobility                  Transfers Overall transfer level: Needs assistance Equipment used: Rolling walker (2 wheeled) Transfers: Sit to/from Stand Sit to Stand: Min assist         General transfer comment: steadying assistance; cues for UE/LE placement  Ambulation/Gait Ambulation/Gait assistance: Min assist Ambulation Distance (Feet): 75 Feet Assistive device: Rolling walker (2 wheeled) Gait Pattern/deviations: Step-to pattern;Decreased step length - right;Decreased step length - left;Shuffle;Trunk flexed Gait velocity: decr   General Gait Details: cues for sequence, posture and position from [employer]            Wheelchair Mobility    Modified Rankin (Stroke Patients Only)       Balance                                    Cognition Arousal/Alertness: Awake/alert Behavior During Therapy: WFL for tasks assessed/performed Overall Cognitive Status: Within Functional Limits for tasks assessed                      Exercises      General Comments        Pertinent Vitals/Pain Pain Assessment: 0-10 Pain Score: 5  Pain Location: L knee Pain Descriptors / Indicators: Aching;Sore Pain Intervention(s): Limited activity within patient's tolerance;Monitored during session;Premedicated before session;Ice applied    Home Living                       Prior Function            PT Goals (current goals can now be found in the care plan section) Acute Rehab PT Goals Patient Stated Goal: get back to being independent PT Goal Formulation: With patient Time For Goal Achievement: 01/18/15 Potential to Achieve Goals: Good Progress towards PT goals: Progressing toward goals    Frequency  7X/week    PT Plan Current plan remains appropriate    Co-evaluation             End of Session Equipment Utilized During Treatment: Gait belt Activity Tolerance: Patient tolerated treatment well Patient left: in chair;with call bell/phone within reach     Time: 1325-1346 PT Time Calculation (min) (ACUTE ONLY): 21 min  Charges:  $Gait Training: 8-22 mins                    G Codes:      Tiffane Sheldon 01/20/15, 4:26 PM

## 2015-01-15 NOTE — Progress Notes (Signed)
Physical Therapy Treatment Patient Details Name: Tanith Dagostino MRN: [id number] DOB: 18-Oct-1955 Today's Date: Jan 16, 2015    History of Present Illness s/p L TKA    PT Comments    Pt continues very motivated but ltd with mobility this pm 2* affects of pain meds   Follow Up Recommendations  Home health PT     Equipment Recommendations  Rolling walker with 5" wheels    Recommendations for Other Services OT consult     Precautions / Restrictions Precautions Precautions: Knee;Fall Restrictions Weight Bearing Restrictions: No Other Position/Activity Restrictions: WBAT    Mobility  Bed Mobility Overal bed mobility: Needs Assistance Bed Mobility: Sit to Supine       Sit to supine: Min assist   General bed mobility comments: cues for sequence and use of R LE to self assist  Transfers Overall transfer level: Needs assistance Equipment used: Rolling walker (2 wheeled) Transfers: Sit to/from Stand Sit to Stand: Min assist         General transfer comment: steadying assistance; cues for UE/LE placement  Ambulation/Gait Ambulation/Gait assistance: Min assist Ambulation Distance (Feet): 5 Feet Assistive device: Rolling walker (2 wheeled) Gait Pattern/deviations: Step-to pattern;Decreased step length - right;Decreased step length - left;Shuffle;Trunk flexed Gait velocity: decr   General Gait Details: cues for sequence, posture and position from [employer]            Wheelchair Mobility    Modified Rankin (Stroke Patients Only)       Balance                                    Cognition Arousal/Alertness: Awake/alert Behavior During Therapy: WFL for tasks assessed/performed Overall Cognitive Status: Within Functional Limits for tasks assessed                      Exercises Total Joint Exercises Ankle Circles/Pumps: AROM;Both;15 reps;Supine Quad Sets: AROM;Both;10 reps;Supine Heel Slides: AAROM;15  reps;Supine;Left Straight Leg Raises: AAROM;AROM;Left;15 reps;Supine    General Comments        Pertinent Vitals/Pain Pain Assessment: 0-10 Pain Score: 3  Pain Location: L knee Pain Descriptors / Indicators: Aching;Sore Pain Intervention(s): Monitored during session;Limited activity within patient's tolerance;Premedicated before session;Ice applied    Home Living                      Prior Function            PT Goals (current goals can now be found in the care plan section) Acute Rehab PT Goals Patient Stated Goal: get back to being independent PT Goal Formulation: With patient Time For Goal Achievement: 01/18/15 Potential to Achieve Goals: Good Progress towards PT goals: Progressing toward goals    Frequency  7X/week    PT Plan Current plan remains appropriate    Co-evaluation             End of Session Equipment Utilized During Treatment: Gait belt Activity Tolerance: Patient tolerated treatment well Patient left: in bed;with call bell/phone within reach     Time: 9379-0240 PT Time Calculation (min) (ACUTE ONLY): 15 min  Charges:  $Gait Training: 8-22 mins $Therapeutic Exercise: 8-22 mins                    G Codes:      Mayra Brahm 16-Jan-2015, 4:31 PM

## 2015-01-16 LAB — BASIC METABOLIC PANEL
ANION GAP: 5 (ref 5–15)
BUN: 18 mg/dL (ref 6–20)
CALCIUM: 9.2 mg/dL (ref 8.9–10.3)
CHLORIDE: 106 mmol/L (ref 101–111)
CO2: 30 mmol/L (ref 22–32)
Creatinine, Ser: 0.88 mg/dL (ref 0.44–1.00)
GFR calc non Af Amer: >60 mL/min (ref >60)
Glucose, Bld: 115 mg/dL — ABNORMAL HIGH (ref 65–99)
Potassium: 4.8 mmol/L (ref 3.5–5.1)
SODIUM: 141 mmol/L (ref 135–145)

## 2015-01-16 LAB — CBC
HEMATOCRIT: 32.4 % — AB (ref 36.0–46.0)
HEMOGLOBIN: 10.3 g/dL — AB (ref 12.0–15.0)
MCH: 22.5 pg — ABNORMAL LOW (ref 26.0–34.0)
MCHC: 31.8 g/dL (ref 30.0–36.0)
MCV: 70.7 fL — ABNORMAL LOW (ref 78.0–100.0)
Platelets: 247 10*3/uL (ref 150–400)
RBC: 4.58 MIL/uL (ref 3.87–5.11)
RDW: 15.1 % (ref 11.5–15.5)
WBC: 15.1 10*3/uL — AB (ref 4.0–10.5)

## 2015-01-16 NOTE — Progress Notes (Signed)
Physical Therapy Treatment Patient Details Name: Cheyeanne Roadcap MRN: [id number] DOB: [birth year] Today's Date: 01/16/2015    History of Present Illness s/p L TKA    PT Comments    Pt progressing well with mobility and eager for dc home this pm  Follow Up Recommendations  Home health PT     Equipment Recommendations  Rolling walker with 5" wheels    Recommendations for Other Services OT consult     Precautions / Restrictions Precautions Precautions: Knee;Fall Restrictions Weight Bearing Restrictions: No Other Position/Activity Restrictions: WBAT    Mobility  Bed Mobility Overal bed mobility: Needs Assistance Bed Mobility: Supine to Sit     Supine to sit: Supervision     General bed mobility comments: cues for sequence and use of R LE to self assist  Transfers Overall transfer level: Needs assistance Equipment used: Rolling walker (2 wheeled) Transfers: Sit to/from Stand Sit to Stand: Min guard;Supervision         General transfer comment: cues for LE management and use of UEs to self assist  Ambulation/Gait Ambulation/Gait assistance: Min guard;Supervision Ambulation Distance (Feet): 123 Feet Assistive device: Rolling walker (2 wheeled) Gait Pattern/deviations: Step-to pattern;Decreased step length - right;Decreased step length - left;Shuffle;Trunk flexed Gait velocity: decr   General Gait Details: cues for sequence, posture and position from [employer]            Wheelchair Mobility    Modified Rankin (Stroke Patients Only)       Balance                                    Cognition Arousal/Alertness: Awake/alert Behavior During Therapy: WFL for tasks assessed/performed Overall Cognitive Status: Within Functional Limits for tasks assessed                      Exercises Total Joint Exercises Ankle Circles/Pumps: AROM;Both;15 reps;Supine Quad Sets: AROM;Both;Supine;20 reps Heel Slides: AAROM;Supine;Left;20  reps Straight Leg Raises: AAROM;AROM;Left;Supine;20 reps Long Arc Quad: AROM;Left;Seated;15 reps Goniometric ROM: AAROM at L knee -8 - 55    General Comments        Pertinent Vitals/Pain Pain Assessment: 0-10 Pain Score: 3  Pain Location: L knee Pain Descriptors / Indicators: Aching;Sore Pain Intervention(s): Limited activity within patient's tolerance;Monitored during session;Premedicated before session;Ice applied    Home Living                      Prior Function            PT Goals (current goals can now be found in the care plan section) Acute Rehab PT Goals Patient Stated Goal: get back to being independent PT Goal Formulation: With patient Time For Goal Achievement: 01/18/15 Potential to Achieve Goals: Good Progress towards PT goals: Progressing toward goals    Frequency  7X/week    PT Plan Current plan remains appropriate    Co-evaluation             End of Session Equipment Utilized During Treatment: Gait belt Activity Tolerance: Patient tolerated treatment well Patient left: in chair;with call bell/phone within reach     Time: 0824-0858 PT Time Calculation (min) (ACUTE ONLY): 34 min  Charges:  $Gait Training: 8-22 mins $Therapeutic Exercise: 8-22 mins                    G Codes:  Kaylany Tesoriero 01/16/2015, 11:03 AM

## 2015-01-16 NOTE — Progress Notes (Signed)
Physical Therapy Treatment Patient Details Name: Lindsey Demonte MRN: [id number] DOB: 07/18/1955 Today's Date: 01/16/2015    History of Present Illness s/p L TKA    PT Comments    Pt progressing well with mobility.  Reviewed stairs and therex with written information provided.  Follow Up Recommendations  Home health PT     Equipment Recommendations  Rolling walker with 5" wheels    Recommendations for Other Services OT consult     Precautions / Restrictions Precautions Precautions: Knee;Fall Restrictions Weight Bearing Restrictions: No Other Position/Activity Restrictions: WBAT    Mobility  Bed Mobility Overal bed mobility: Needs Assistance Bed Mobility: Supine to Sit;Sit to Supine     Supine to sit: Supervision Sit to supine: Supervision      Transfers Overall transfer level: Needs assistance Equipment used: Rolling walker (2 wheeled) Transfers: Sit to/from Stand Sit to Stand: Supervision         General transfer comment: min cues for LE management and use of UEs to self assist  Ambulation/Gait Ambulation/Gait assistance: Supervision Ambulation Distance (Feet): 100 Feet Assistive device: Rolling walker (2 wheeled) Gait Pattern/deviations: Step-to pattern;Decreased step length - right;Decreased step length - left;Shuffle;Trunk flexed Gait velocity: decr   General Gait Details: min cues for sequence, posture and position from RW   Stairs Stairs: Yes Stairs assistance: Min assist Stair Management: Step to pattern;Forwards;With crutches;One rail Left Number of Stairs: 10 General stair comments: cues for sequence and foot/crutch placement  Wheelchair Mobility    Modified Rankin (Stroke Patients Only)       Balance                                    Cognition Arousal/Alertness: Awake/alert Behavior During Therapy: WFL for tasks assessed/performed Overall Cognitive Status: Within Functional Limits for tasks assessed                       Exercises Total Joint Exercises Ankle Circles/Pumps: AROM;Both;15 reps;Supine Quad Sets: AROM;Both;Supine;20 reps Heel Slides: AAROM;Supine;Left;20 reps Straight Leg Raises: AAROM;AROM;Left;Supine;20 reps    General Comments        Pertinent Vitals/Pain Pain Assessment: 0-10 Pain Score: 4  Pain Location: L knee Pain Descriptors / Indicators: Aching;Sore Pain Intervention(s): Limited activity within patient's tolerance;Monitored during session;Premedicated before session;Ice applied    Home Living                      Prior Function            PT Goals (current goals can now be found in the care plan section) Acute Rehab PT Goals Patient Stated Goal: get back to being independent PT Goal Formulation: With patient Time For Goal Achievement: 01/18/15 Potential to Achieve Goals: Good Progress towards PT goals: Progressing toward goals    Frequency  7X/week    PT Plan Current plan remains appropriate    Co-evaluation             End of Session Equipment Utilized During Treatment: Gait belt Activity Tolerance: Patient tolerated treatment well Patient left: in bed;with call bell/phone within reach     Time: 1318-1400 PT Time Calculation (min) (ACUTE ONLY): 42 min  Charges:  $Gait Training: 8-22 mins $Therapeutic Exercise: 8-22 mins $Therapeutic Activity: 8-22 mins                    G Codes:  Preet Perrier 01/16/2015, 4:11 PM

## 2015-01-16 NOTE — Progress Notes (Signed)
     Subjective: 2 Days Post-Op Procedure(s) (LRB): TOTAL KNEE ARTHROPLASTY (Left)   Patient reports pain as mild, pain controlled better today that yesterday. No events throughout the night.  Patient is ready to be discharged home.  Objective:   VITALS:   Filed Vitals:   01/15/15 2202 01/16/15 0517  BP: 125/80 118/64  Pulse: 75 63  Temp: 98.7 F (37.1 C) 97.5 F (36.4 C)  Resp: 16 16    Dorsiflexion/Plantar flexion intact Incision: dressing C/D/I No cellulitis present Compartment soft  LABS  Recent Labs  01/15/15 0525 01/16/15 0438  HGB 11.0* 10.3*  HCT 34.9* 32.4*  WBC 15.0* 15.1*  PLT 255 247     Recent Labs  01/15/15 0525 01/16/15 0438  NA 138 141  K 4.3 4.8  BUN 10 18  CREATININE 0.76 0.88  GLUCOSE 127* 115*     Assessment/Plan: 2 Days Post-Op Procedure(s) (LRB): TOTAL KNEE ARTHROPLASTY (Left) Up with therapy Discharge home with home health  Follow up in 2 weeks at [facility]. Follow up with OLIN,Iliana Hutt D in 2 weeks.  Contact information:  [facility] [address], [address] [place] Vertis Scheib   PAC  01/16/2015, 8:24 AM

## 2015-01-17 NOTE — Discharge Summary (Signed)
Physician Discharge Summary  Patient ID: [name] MRN: [id number] DOB/AGE: Jul 14, 1955 59 y.o.  Admit date: 01/14/2015 Discharge date: 01/16/2015   Procedures:  Procedure(s) (LRB): TOTAL KNEE ARTHROPLASTY (Left)  Attending Physician:  Dr. Paralee Cancel   Admission Diagnoses:   Left knee primary OA /pain  Discharge Diagnoses:  Principal Problem:   S/P left TKA Active Problems:   S/P knee replacement  Past Medical History  Diagnosis Date  . Migraine   . Arthritis   . UC (ulcerative colitis) ([place]) 06/2012    Colonoscopy with Dr. Hilarie Fredrickson  . Microcytic anemia   . GERD (gastroesophageal reflux disease)     HPI:    [name], 59 y.o. female, has a history of pain and functional disability in the left knee due to arthritis and has failed non-surgical conservative treatments for greater than 12 weeks to include NSAID's and/or analgesics, corticosteriod injections, viscosupplementation injections and activity modification. Onset of symptoms was gradual, starting 7+ years ago with gradually worsening course since that time. The patient noted prior procedures on the knee to include arthroscopy and menisectomy on the left knee(s). Patient currently rates pain in the left knee(s) at 10 out of 10 with activity. Patient has night pain, worsening of pain with activity and weight bearing, pain that interferes with activities of daily living, pain with passive range of motion, crepitus and joint swelling. Patient has evidence of periarticular osteophytes and joint space narrowing by imaging studies. There is no active infection. Risks, benefits and expectations were discussed with the patient. Risks including but not limited to the risk of anesthesia, blood clots, nerve damage, blood vessel damage, failure of the prosthesis, infection and up to and including death. Patient understand the risks, benefits and expectations and wishes to proceed with surgery.   PCP: [name], MD     Discharged Condition: good  Hospital Course:  Patient underwent the above stated procedure on 01/14/2015. Patient tolerated the procedure well and brought to the recovery room in good condition and subsequently to the floor.  POD #1 BP: 104/59 ; Pulse: 58 ; Temp: 97.8 F (36.6 C) ; Resp: 14 Patient reports pain as moderate, pain not fully controlled. No events throughout the night.  Dorsiflexion/plantar flexion intact, incision: dressing C/D/I, no cellulitis present and compartment soft.   LABS  Basename    HGB     11.0  HCT     34.9   POD #2  BP: 118/64 ; Pulse: 63 ; Temp: 97.5 F (36.4 C) ; Resp: 16 Patient reports pain as mild, pain controlled better today that yesterday. No events throughout the night. Patient is ready to be discharged home. Dorsiflexion/plantar flexion intact, incision: dressing C/D/I, no cellulitis present and compartment soft.   LABS  Basename    HGB     10.3  HCT     32.4    Discharge Exam: General appearance: alert, cooperative and no distress Extremities: Homans sign is negative, no sign of DVT, no edema, redness or tenderness in the calves or thighs and no ulcers, gangrene or trophic changes  Disposition: Home with follow up in 2 weeks   Follow-up Information    Follow up with Mauri Pole, MD. Schedule an appointment as soon as possible for a visit in 2 weeks.   Specialty:  Orthopedic Surgery   Contact information:   [address] [place] [address] [phone]       Follow up with [facility].   Why:  Physical therapy  Contact information:   [address] [place] [address] [phone]       Follow up with [facility].   Why:  rolling walker   Contact information:   [address] [place] [address] [phone]       Discharge Instructions    Call MD / Call 911    Complete by:  As directed   If you experience chest pain or shortness of breath, CALL  911 and be transported to the hospital emergency room.  If you develope a fever above 101 F, pus (white drainage) or increased drainage or redness at the wound, or calf pain, call your surgeon's office.     Change dressing    Complete by:  As directed   Maintain surgical dressing until follow up in the clinic. If the edges start to pull up, may reinforce with tape. If the dressing is no longer working, may remove and cover with gauze and tape, but must keep the area dry and clean.  Call with any questions or concerns.     Constipation Prevention    Complete by:  As directed   Drink plenty of fluids.  Prune juice may be helpful.  You may use a stool softener, such as Colace (over the counter) 100 mg twice a day.  Use MiraLax (over the counter) for constipation as needed.     Diet - low sodium heart healthy    Complete by:  As directed      Discharge instructions    Complete by:  As directed   Maintain surgical dressing until follow up in the clinic. If the edges start to pull up, may reinforce with tape. If the dressing is no longer working, may remove and cover with gauze and tape, but must keep the area dry and clean.  Follow up in 2 weeks at [facility]. Call with any questions or concerns.     Increase activity slowly as tolerated    Complete by:  As directed   Weight bearing as tolerated with assist device (walker, cane, etc) as directed, use it as long as suggested by your surgeon or therapist, typically at least 4-6 weeks.     TED hose    Complete by:  As directed   Use stockings (TED hose) for 2 weeks on both leg(s).  You may remove them at night for sleeping.             Medication List    TAKE these medications        acetaminophen 500 MG tablet  Commonly known as:  TYLENOL  Take 2 tablets (1,000 mg total) by mouth every 8 (eight) hours.     aspirin 325 MG EC tablet  Take 1 tablet (325 mg total) by mouth 2 (two) times daily.     dextromethorphan-guaiFENesin  30-600 MG 12hr tablet  Commonly known as:  MUCINEX DM  Take 1 tablet by mouth 2 (two) times daily as needed.     docusate sodium 100 MG capsule  Commonly known as:  COLACE  Take 1 capsule (100 mg total) by mouth 2 (two) times daily.     ferrous sulfate 325 (65 FE) MG tablet  Take 1 tablet (325 mg total) by mouth 3 (three) times daily after meals.     mesalamine 1.2 G EC tablet  Commonly known as:  LIALDA  Take 2 tablets (2.4 g total) by mouth daily.  methocarbamol 500 MG tablet  Commonly known as:  ROBAXIN  Take 1 tablet (500 mg total) by mouth every 6 (six) hours as needed for muscle spasms.     oxyCODONE 5 MG immediate release tablet  Commonly known as:  Oxy IR/ROXICODONE  Take 1-3 tablets (5-15 mg total) by mouth every 4 (four) hours as needed for severe pain.     polyethylene glycol packet  Commonly known as:  MIRALAX / GLYCOLAX  Take 17 g by mouth 2 (two) times daily.     rizatriptan 10 MG tablet  Commonly known as:  MAXALT  Take 1 tablet (10 mg total) by mouth as needed for migraine. May repeat in 2 hours if needed         Signed: West Pugh. Cletus Mehlhoff   PA-C  01/17/2015, 8:12 AM

## 2015-01-28 ENCOUNTER — Ambulatory Visit (HOSPITAL_COMMUNITY)
Admission: RE | Admit: 2015-01-28 | Discharge: 2015-01-28 | Disposition: A | Discharge status: Home / Self Care | Payer: BLUE CROSS/BLUE SHIELD | Source: Ambulatory Visit | Attending: Physician Assistant | Admitting: Physician Assistant

## 2015-01-28 ENCOUNTER — Other Ambulatory Visit (HOSPITAL_COMMUNITY): Payer: Self-pay | Admitting: Physician Assistant

## 2015-01-28 DIAGNOSIS — M79609 Pain in unspecified limb: Secondary | ICD-10-CM | POA: Diagnosis not present

## 2015-01-28 DIAGNOSIS — R52 Pain, unspecified: Secondary | ICD-10-CM

## 2015-01-28 NOTE — Progress Notes (Signed)
*  Preliminary Results* Left lower extremity venous duplex completed. Left lower extremity is negative for deep vein thrombosis. There is no evidence of left Baker's cyst.  01/28/2015 5:03 PM  Maudry Mayhew, RVT, RDCS, RDMS

## 2015-02-04 ENCOUNTER — Encounter: Payer: Self-pay | Admitting: Physical Therapy

## 2015-02-04 ENCOUNTER — Ambulatory Visit (INDEPENDENT_AMBULATORY_CARE_PROVIDER_SITE_OTHER): Payer: BLUE CROSS/BLUE SHIELD | Admitting: Physical Therapy

## 2015-02-04 ENCOUNTER — Ambulatory Visit: Payer: BLUE CROSS/BLUE SHIELD

## 2015-02-04 DIAGNOSIS — R269 Unspecified abnormalities of gait and mobility: Secondary | ICD-10-CM | POA: Diagnosis not present

## 2015-02-04 DIAGNOSIS — R29898 Other symptoms and signs involving the musculoskeletal system: Secondary | ICD-10-CM

## 2015-02-04 DIAGNOSIS — M6289 Other specified disorders of muscle: Secondary | ICD-10-CM | POA: Diagnosis not present

## 2015-02-04 DIAGNOSIS — M7989 Other specified soft tissue disorders: Secondary | ICD-10-CM

## 2015-02-04 DIAGNOSIS — M25662 Stiffness of left knee, not elsewhere classified: Secondary | ICD-10-CM

## 2015-02-04 DIAGNOSIS — M79662 Pain in left lower leg: Secondary | ICD-10-CM

## 2015-02-04 NOTE — Patient Instructions (Addendum)
Gastroc Stretch  [place] [phone]    Stand with right foot back, leg straight, forward leg bent. Keeping heel on floor, turned slightly out, lean into wall until stretch is felt in calf. Hold __30__ seconds. Repeat _2___ times per set. Do _1___ sets per session. Do __2-3__ sessions per day.  Balance: Unilateral - do next to a counter for safety    Attempt to balance on left leg, eyes open. Hold _as long as able___ seconds. Repeat __5-10__ times per set. Do _1__ sets per session. Do __2-3__ sessions per day. Perform exercise with eyes closed.  Copyright  VHI. All rights reserved.

## 2015-02-04 NOTE — Therapy (Signed)
[place], [place], [address] Phone: [phone]   Fax:  [phone]  Physical Therapy Evaluation  Patient Details  Name: [name] MRN: [id number] Date of Birth: 28-Nov-1955 Referring Provider: Dr Salli Quarry  Encounter Date: 02/04/2015      PT End of Session - 02/04/15 0929    Visit Number 1   Number of Visits 12   Date for PT Re-Evaluation 03/04/15   PT Start Time 0843   PT Stop Time 0941   PT Time Calculation (min) 58 min   Activity Tolerance Patient tolerated treatment well      Past Medical History  Diagnosis Date  . Migraine   . Arthritis   . UC (ulcerative colitis) ([place]) 06/2012    Colonoscopy with Dr. Hilarie Fredrickson  . Microcytic anemia   . GERD (gastroesophageal reflux disease)     Past Surgical History  Procedure Laterality Date  . Abdominal hysterectomy      partial  . Knee surgery  12-13,11-10    meniscal repairs  . Breast surgery    . Colonoscopy  20 + years ago    In [place].  . Total knee arthroplasty Left 01/14/2015    Procedure: TOTAL KNEE ARTHROPLASTY;  Surgeon: Paralee Cancel, MD;  Location: [facility];  Service: Orthopedics;  Laterality: Left;    There were no vitals filed for this visit.  Visit Diagnosis:  Stiffness of knee joint, left - Plan: PT plan of care cert/re-cert  Weakness of both hips - Plan: PT plan of care cert/re-cert  Pain and swelling of lower leg, left - Plan: PT plan of care cert/re-cert  Abnormality of gait - Plan: PT plan of care cert/re-cert      Subjective Assessment - 02/04/15 0855    Subjective Pt reports about 6-7 yrs ago she started having trouble with her Lt knee, had a scope then and was OK for awhile.  Tried the series of injections and didn't have a lot of results.  She elected ot have Lt TKA on11/29/16. Curently uses a single crutch at home, presented ot the clinic with a RW today.     Pertinent History Needs Rt TKA in the  future, Lt knee scope ~ 7 yrs ago.  Rt side bulging disc dx ~ 3 months ago   How long can you sit comfortably? ~ 1 hr   How long can you stand comfortably? immediate pain ~ 5'   How long can you walk comfortably? household ambulation only at this time.    Patient Stated Goals walk without pain and swelling   Currently in Pain? Yes   Pain Score 6    Pain Location Knee   Pain Orientation Left   Pain Descriptors / Indicators Squeezing;Throbbing;Tightness   Pain Type Surgical pain   Pain Onset 1 to 4 weeks ago   Pain Frequency Constant   Aggravating Factors  being up on the leg, quick turn when she forgets   Pain Relieving Factors medication, ice            [facility] PT Assessment - 02/04/15 0001    Assessment   Medical Diagnosis Lt TKA   Referring Provider Dr Salli Quarry   Onset Date/Surgical Date 01/14/15   Hand Dominance Right   Next MD Visit 03/05/15   Prior Therapy HHPT  finished up on Friday   Precautions   Precautions None   Required Braces or Orthoses --  none   Restrictions  Weight Bearing Restrictions --  WBAT   Balance Screen   Has the patient fallen in the past 6 months No   Has the patient had a decrease in activity level because of a fear of falling?  No   Is the patient reluctant to leave their home because of a fear of falling?  No   Home Environment   Living Environment Private residence   Home Access Stairs to enter  full into her condo on second floor   Entrance Stairs-Rails Left   Prior Function   Level of Independence Independent with basic ADLs   Vocation Full time employment   Vocation Requirements desk job IT department   Leisure walk   Observation/Other Assessments   Focus on Therapeutic Outcomes (FOTO)  62% limited   Observation/Other Assessments-Edema    Edema --  (+) edema in Lt knee   Functional Tests   Functional tests Single leg stance   Single Leg Stance   Comments Lt 3 sec   ROM / Strength   AROM / PROM / Strength AROM;PROM;Strength    AROM   AROM Assessment Site Knee   Right/Left Knee Right;Left   Right Knee Extension -2   Right Knee Flexion 117   Left Knee Extension -5   Left Knee Flexion 74   PROM   PROM Assessment Site Knee   Right/Left Knee Left   Left Knee Extension -4   Left Knee Flexion 80   Strength   Strength Assessment Site Hip;Knee   Right/Left Hip Right;Left   Right Hip Flexion 5/5   Right Hip Extension 4/5   Right Hip ABduction 5/5   Left Hip Flexion 4+/5   Left Hip Extension 4/5   Left Hip ABduction 4+/5   Right/Left Knee Right;Left   Right Knee Flexion 5/5   Right Knee Extension 5/5   Left Knee Flexion 4+/5   Left Knee Extension 4+/5   Palpation   Patella mobility hypomobile and very tender   Palpation comment tight in Lt knee, hypomobile incision   Transfers   Transfers Independent with all Transfers   Ambulation/Gait   Ambulation/Gait --  observed in the clinic with and without RW   Gait Pattern Step-to pattern;Decreased arm swing - right;Decreased arm swing - left;Decreased step length - right;Left circumduction;Antalgic;Trunk flexed                   [facility] Adult PT Treatment/Exercise - 02/04/15 0001    Exercises   Exercises Knee/Hip   Knee/Hip Exercises: Stretches   Gastroc Stretch 2 reps;Left;30 seconds   Knee/Hip Exercises: Aerobic   Nustep L3 x 5'   Knee/Hip Exercises: Standing   SLS x5 reps at counter top   Modalities   Modalities Designer, multimedia Location Lt knee   Risk analyst Stimulation Parameters to tolerance   Electrical Stimulation Goals Edema;Pain   Vasopneumatic   Number Minutes Vasopneumatic  15 minutes   Vasopnuematic Location  Knee   Vasopneumatic Pressure Medium   Vasopneumatic Temperature  3*                PT Education - 02/04/15 0915    Education provided Yes   Education Details HEP and scar mobilization    Person(s) Educated Patient   Methods Explanation;Demonstration;Handout   Comprehension Returned demonstration;Verbalized understanding             PT Long Term Goals - 02/04/15 1058  PT LONG TERM GOAL #1   Title I with advanced HEP ( 03/04/15)    Time 4   Period Weeks   Status New   PT LONG TERM GOAL #2   Title increase Lt knee flexion =/> 105 degrees to assist with stairs ( 03/04/15)    Time 4   Period Weeks   Status New   PT LONG TERM GOAL #3   Title ambulate with heel toe pattern without an assistive device ( 03/04/15)    Time 4   Period Weeks   Status New   PT LONG TERM GOAL #4   Title ambulate up/down steps to her condo with alternating gait and no difficulty ( 03/04/15)    Time 4   Period Weeks   Status New   PT LONG TERM GOAL #5   Title improve bilat hip strength =/> 5-/5 ( 03/04/15)    Time 4   Period Weeks   Additional Long Term Goals   Additional Long Term Goals Yes   PT LONG TERM GOAL #6   Title report pain decrease =/> 50% with daily activity ( 03/04/15)    Time 4   Period Weeks   Status New   PT LONG TERM GOAL #7   Title improve FOTO =/< 47% limited ( 03/04/15)    Time 4   Period Weeks   Status New               Plan - 02/04/15 0930    Clinical Impression Statement 59 yo female s/p elective Lt TKA 01/14/15 presents to the clinic to continue her rehab after HHPT.  She is using a single crutch at home, lives in a second story condo and has a Network engineer job. She presents with LE weakness, stiffness , gait and proprioception defecits. She wishes to perform a walking program if her Rt knee pain will allow.    Pt will benefit from skilled therapeutic intervention in order to improve on the following deficits Increased edema;Decreased strength;Decreased balance;Hypomobility;Impaired flexibility;Pain;Decreased range of motion;Abnormal gait   Rehab Potential Excellent   Clinical Impairments Affecting Rehab Potential Patient reports she was recently diagnosed  with a lumbar disc issue, this may complicate her rehab if it flares up.    PT Frequency 3x / week   PT Duration 4 weeks   PT Treatment/Interventions Ultrasound;Neuromuscular re-education;Balance training;Scar mobilization;Patient/family education;DME Instruction;Passive range of motion;Gait training;Cryotherapy;Stair training;Dry needling;Electrical Stimulation;Taping;Moist Heat;Therapeutic exercise;Manual techniques   PT Next Visit Plan Knee ROM, strenthening    Consulted and Agree with Plan of Care Patient         Problem List Patient Active Problem List   Diagnosis Date Noted  . S/P left TKA 01/14/2015  . S/P knee replacement 01/14/2015  . Thoracic back pain 04/05/2014  . Beta thalassemia, heterozygous 03/11/2014  . Right lumbar radiculopathy 11/23/2013  . Arm numbness 07/16/2013  . Right flank pain 12/20/2012  . Drowsiness 09/01/2012  . Ulcerative colitis, universal ([place]) 07/25/2012  . Routine general medical examination at a health care facility 05/19/2012  . Sebaceous cyst 05/19/2012  . THYROIDITIS 01/27/2010  . MIGRAINE HEADACHE 01/27/2010  . ALLERGIC RHINITIS 12/31/2009  . ASTHMA 12/31/2009  . PALPITATIONS 12/31/2009  . SHORTNESS OF BREATH 12/31/2009    Jeral Pinch PT 02/04/2015, 11:07 AM  [facility] [place], [place], [address] Phone: [phone]   Fax:  [phone]  Name: [name] MRN: [id number] Date of Birth: 1955-04-13

## 2015-02-06 ENCOUNTER — Encounter: Payer: Self-pay | Admitting: Rehabilitative and Restorative Service Providers"

## 2015-02-06 ENCOUNTER — Encounter: Payer: BLUE CROSS/BLUE SHIELD | Admitting: Physical Therapy

## 2015-02-06 ENCOUNTER — Ambulatory Visit (INDEPENDENT_AMBULATORY_CARE_PROVIDER_SITE_OTHER): Payer: BLUE CROSS/BLUE SHIELD | Admitting: Rehabilitative and Restorative Service Providers"

## 2015-02-06 DIAGNOSIS — M6289 Other specified disorders of muscle: Secondary | ICD-10-CM | POA: Diagnosis not present

## 2015-02-06 DIAGNOSIS — R29898 Other symptoms and signs involving the musculoskeletal system: Secondary | ICD-10-CM

## 2015-02-06 DIAGNOSIS — M7989 Other specified soft tissue disorders: Secondary | ICD-10-CM

## 2015-02-06 DIAGNOSIS — M25662 Stiffness of left knee, not elsewhere classified: Secondary | ICD-10-CM

## 2015-02-06 DIAGNOSIS — R269 Unspecified abnormalities of gait and mobility: Secondary | ICD-10-CM

## 2015-02-06 DIAGNOSIS — M79662 Pain in left lower leg: Secondary | ICD-10-CM | POA: Diagnosis not present

## 2015-02-06 NOTE — Therapy (Signed)
[place], [place], [address] Phone: [phone]   Fax:  [phone]  Physical Therapy Treatment  Patient Details  Name: [name] MRN: [id number] Date of Birth: [birth year] Referring Provider: Dr Salli Quarry  Encounter Date: 02/06/2015      PT End of Session - 02/06/15 0806    Visit Number 2   Number of Visits 12   Date for PT Re-Evaluation 03/04/15   PT Start Time 0805   PT Stop Time 0859   PT Time Calculation (min) 54 min   Activity Tolerance Patient tolerated treatment well      Past Medical History  Diagnosis Date  . Migraine   . Arthritis   . UC (ulcerative colitis) ([place]) 06/2012    Colonoscopy with Dr. Hilarie Fredrickson  . Microcytic anemia   . GERD (gastroesophageal reflux disease)     Past Surgical History  Procedure Laterality Date  . Abdominal hysterectomy      partial  . Knee surgery  12-13,11-10    meniscal repairs  . Breast surgery    . Colonoscopy  20 + years ago    In [place].  . Total knee arthroplasty Left 01/14/2015    Procedure: TOTAL KNEE ARTHROPLASTY;  Surgeon: Paralee Cancel, MD;  Location: [facility];  Service: Orthopedics;  Laterality: Left;    There were no vitals filed for this visit.  Visit Diagnosis:  Stiffness of knee joint, left  Weakness of both hips  Pain and swelling of lower leg, left  Abnormality of gait                       [facility] Adult PT Treatment/Exercise - 02/06/15 0001    Ambulation/Gait   Gait Comments working on Personnel officer with [employer] working on gait pattern focus on bending knee to swing through x 80 feet    Therapeutic Activites    Therapeutic Activities --  massage with the stick    Exercises   Exercises Knee/Hip   Knee/Hip Exercises: Stretches   Passive Hamstring Stretch Left;3 reps;30 seconds   Gastroc Stretch 2 reps;Left;30 seconds   Knee/Hip Exercises: Aerobic   Nustep L3 x 5'   Knee/Hip Exercises:  Seated   Other Seated Knee/Hip Exercises foot flat in knee flexion scooting hips forward on surface hold 20-30 sec x 5    Knee/Hip Exercises: Supine   Quad Sets Left;1 set;10 reps  10 sec hold    Straight Leg Raises Left;1 set;10 reps  5 sec hold    Knee Flexion Limitations supine with thigh supported allowing gravity to flex knee 5 reps    Electrical Stimulation   Electrical Stimulation Location Lt knee   Electrical Stimulation Action ion repelling    Electrical Stimulation Parameters to tolerance   Electrical Stimulation Goals Edema;Pain   Vasopneumatic   Number Minutes Vasopneumatic  15 minutes   Vasopnuematic Location  Knee   Vasopneumatic Pressure Medium   Vasopneumatic Temperature  3*                PT Education - 02/06/15 [phone]    Education provided Yes   Education Details HEP gait with cane - patient will purchase canew for home    Person(s) Educated Patient   Methods Explanation;Demonstration;Tactile cues;Verbal cues;Handout   Comprehension Verbalized understanding;Returned demonstration;Verbal cues required;Tactile cues required             PT Long Term Goals - 02/04/15 1058  PT LONG TERM GOAL #1   Title I with advanced HEP ( 03/04/15)    Time 4   Period Weeks   Status New   PT LONG TERM GOAL #2   Title increase Lt knee flexion =/> 105 degrees to assist with stairs ( 03/04/15)    Time 4   Period Weeks   Status New   PT LONG TERM GOAL #3   Title ambulate with heel toe pattern without an assistive device ( 03/04/15)    Time 4   Period Weeks   Status New   PT LONG TERM GOAL #4   Title ambulate up/down steps to her condo with alternating gait and no difficulty ( 03/04/15)    Time 4   Period Weeks   Status New   PT LONG TERM GOAL #5   Title improve bilat hip strength =/> 5-/5 ( 03/04/15)    Time 4   Period Weeks   Additional Long Term Goals   Additional Long Term Goals Yes   PT LONG TERM GOAL #6   Title report pain decrease =/> 50% with daily  activity ( 03/04/15)    Time 4   Period Weeks   Status New   PT LONG TERM GOAL #7   Title improve FOTO =/< 47% limited ( 03/04/15)    Time 4   Period Weeks   Status New               Plan - 02/06/15 9798    Clinical Impression Statement Initial stiffness with improvement as pt continued to exercise. She is workiing to progress to lesser assistive device for ambulation. She is working on exercises at home and progressing toward stated goals of treatment.    Pt will benefit from skilled therapeutic intervention in order to improve on the following deficits Increased edema;Decreased strength;Decreased balance;Hypomobility;Impaired flexibility;Pain;Decreased range of motion;Abnormal gait   Rehab Potential Excellent   Clinical Impairments Affecting Rehab Potential Patient reports she was recently diagnosed with a lumbar disc issue, this may complicate her rehab if it flares up.    PT Frequency 3x / week   PT Duration 4 weeks   PT Treatment/Interventions Ultrasound;Neuromuscular re-education;Balance training;Scar mobilization;Patient/family education;DME Instruction;Passive range of motion;Gait training;Cryotherapy;Stair training;Dry needling;Electrical Stimulation;Taping;Moist Heat;Therapeutic exercise;Manual techniques   PT Next Visit Plan Knee ROM, strenthening    PT Home Exercise Plan HEP    Consulted and Agree with Plan of Care Patient        Problem List Patient Active Problem List   Diagnosis Date Noted  . S/P left TKA 01/14/2015  . S/P knee replacement 01/14/2015  . Thoracic back pain 04/05/2014  . Beta thalassemia, heterozygous 03/11/2014  . Right lumbar radiculopathy 11/23/2013  . Arm numbness 07/16/2013  . Right flank pain 12/20/2012  . Drowsiness 09/01/2012  . Ulcerative colitis, universal ([place]) 07/25/2012  . Routine general medical examination at a health care facility 05/19/2012  . Sebaceous cyst 05/19/2012  . THYROIDITIS 01/27/2010  . MIGRAINE HEADACHE  01/27/2010  . ALLERGIC RHINITIS 12/31/2009  . ASTHMA 12/31/2009  . PALPITATIONS 12/31/2009  . SHORTNESS OF BREATH 12/31/2009    Ashleen Demma Nilda Simmer PT, MPH  02/06/2015, 8:46 AM  [facility] [place], [place], [address] Phone: [phone]   Fax:  [phone]  Name: [name] MRN: [id number] Date of Birth: 23-Jul-1955

## 2015-02-06 NOTE — Patient Instructions (Addendum)
Knee Wall Slide   Do this sitting with your foot flat on the floor and scoot hips forward until you feel a stretch  Slowly "walk" or slide feet on wall toward floor until stretch is felt in knees. Hold 30 sec  Repeat _5___ times per set. Do _1-2___ sets per session. Do __2-3 __ sessions per day.   Lying on back pull thigh up toward chest and let gravity bend your knee  Hold 30 sec 5 reps  2-3 times/day  HIP: Hamstrings - Supine    Place strap around foot. Raise leg up, keep knee straight. Hold __30_ seconds. _3__ reps per set, _2-3__ sets per day  Quad Set    With other leg bent, foot flat, slowly tighten muscles on thigh of straight leg while counting out loud to _10 seconds__ Repeat __10__ times. Do _2-3___ sessions per day.HIP: Flexion / KNEE: Extension, Straight Leg Raise    Raise leg, keeping knee straight. Perform slowly. _hold 5 sec 10 __ reps per set, _1-3__ sets per day, _2-3__ days per week   Massage with rolling pin

## 2015-02-07 ENCOUNTER — Encounter: Payer: Self-pay | Admitting: Rehabilitative and Restorative Service Providers"

## 2015-02-07 ENCOUNTER — Ambulatory Visit (INDEPENDENT_AMBULATORY_CARE_PROVIDER_SITE_OTHER): Payer: BLUE CROSS/BLUE SHIELD | Admitting: Rehabilitative and Restorative Service Providers"

## 2015-02-07 DIAGNOSIS — M79662 Pain in left lower leg: Secondary | ICD-10-CM

## 2015-02-07 DIAGNOSIS — M6289 Other specified disorders of muscle: Secondary | ICD-10-CM | POA: Diagnosis not present

## 2015-02-07 DIAGNOSIS — R269 Unspecified abnormalities of gait and mobility: Secondary | ICD-10-CM | POA: Diagnosis not present

## 2015-02-07 DIAGNOSIS — M7989 Other specified soft tissue disorders: Secondary | ICD-10-CM

## 2015-02-07 DIAGNOSIS — R29898 Other symptoms and signs involving the musculoskeletal system: Secondary | ICD-10-CM

## 2015-02-07 DIAGNOSIS — M25662 Stiffness of left knee, not elsewhere classified: Secondary | ICD-10-CM

## 2015-02-07 NOTE — Therapy (Signed)
[place], [place], [address] Phone: [phone]   Fax:  [phone]  Physical Therapy Treatment  Patient Details  Name: [name] MRN: [id number] Date of Birth: Mar 10, 1955 Referring Provider: Alvan Dame  Encounter Date: 02/07/2015      PT End of Session - 02/07/15 0926    Visit Number 3   Number of Visits 12   Date for PT Re-Evaluation 03/04/15   PT Start Time 0916   PT Stop Time 1010   PT Time Calculation (min) 54 min   Activity Tolerance Patient tolerated treatment well      Past Medical History  Diagnosis Date  . Migraine   . Arthritis   . UC (ulcerative colitis) ([place]) 06/2012    Colonoscopy with Dr. Hilarie Fredrickson  . Microcytic anemia   . GERD (gastroesophageal reflux disease)     Past Surgical History  Procedure Laterality Date  . Abdominal hysterectomy      partial  . Knee surgery  12-13,11-10    meniscal repairs  . Breast surgery    . Colonoscopy  20 + years ago    In [place].  . Total knee arthroplasty Left 01/14/2015    Procedure: TOTAL KNEE ARTHROPLASTY;  Surgeon: Paralee Cancel, MD;  Location: [facility];  Service: Orthopedics;  Laterality: Left;    There were no vitals filed for this visit.  Visit Diagnosis:  Stiffness of knee joint, left  Weakness of both hips  Pain and swelling of lower leg, left  Abnormality of gait      Subjective Assessment - 02/07/15 0926    Subjective Working on exercises at home. Plans to get a cane today. Can tell the walking is getting better. Continues to have pain and awoke with pain this am. Still taking the pain meds daily.    Pain Score 5    Pain Location Knee   Pain Orientation Left   Pain Descriptors / Indicators Tightness;Throbbing;Squeezing   Pain Type Surgical pain   Pain Onset 1 to 4 weeks ago   Pain Frequency Constant            [facility] PT Assessment - 02/07/15 0001    Assessment   Medical Diagnosis Lt TKA   Referring Provider Alvan Dame   Onset Date/Surgical Date 01/14/15   Hand Dominance Right   Next MD Visit 03/05/15   Prior Therapy HHPT  finished up on Friday   PROM   Left Knee Extension -10   Left Knee Flexion 95                     [facility] Adult PT Treatment/Exercise - 02/07/15 0001    Ambulation/Gait   Gait Comments gait training with [place] working on posture and alignment; release of Lt knee for swing phase of Lt LE. 80 ft x3 and work in front of the mirror in standing    Therapeutic Activites    Therapeutic Activities --  massage with the stick; myofacial ball release hamstrings    Exercises   Exercises Knee/Hip   Knee/Hip Exercises: Stretches   Passive Hamstring Stretch Left;3 reps;30 seconds   Gastroc Stretch 2 reps;Left;30 seconds   Knee/Hip Exercises: Aerobic   Nustep L3 x 10'  arrived early - warming up with stepper    Knee/Hip Exercises: Seated   Other Seated Knee/Hip Exercises foot flat in knee flexion scooting hips forward on surface hold 20-30 sec x 5    Knee/Hip Exercises: Supine  Quad Sets Left;1 set;10 reps  10 sec hold    Straight Leg Raises Left;1 set;10 reps  5 sec hold    Knee Flexion Limitations supine with thigh supported allowing gravity to flex knee 5 reps    Other Supine Knee/Hip Exercises knee flexion rolling large ornage ball up bending knee - 10 sec hold x 10    Modalities   Modalities Electrical Stimulation;Vasopneumatic   Electrical Stimulation   Electrical Stimulation Location Lt knee   Electrical Stimulation Action ion repelling    Electrical Stimulation Parameters to tolerance   Electrical Stimulation Goals Edema;Pain   Vasopneumatic   Number Minutes Vasopneumatic  15 minutes   Vasopnuematic Location  Knee   Vasopneumatic Pressure Medium   Vasopneumatic Temperature  3*   Manual Therapy   Joint Mobilization distraction Lt knee in sitting    Myofascial Release quads in sitting   Passive ROM Lt knee flexion in supine; extension in  supine x3 each                 PT Education - 02/07/15 1011    Education provided Yes   Education Details encouraged cont work on HEP with focus on ROM; progress to cane(to purchase cane today)    Person(s) Educated Patient   Methods Explanation   Comprehension Verbalized understanding             PT Long Term Goals - 02/07/15 1024    PT LONG TERM GOAL #1   Title I with advanced HEP ( 03/04/15)    Time 4   Period Weeks   Status On-going   PT LONG TERM GOAL #2   Title increase Lt knee flexion =/> 105 degrees to assist with stairs ( 03/04/15)    Time 4   Period Weeks   Status On-going   PT LONG TERM GOAL #3   Title ambulate with heel toe pattern without an assistive device ( 03/04/15)    Time 4   Period Weeks   Status On-going   PT LONG TERM GOAL #4   Title ambulate up/down steps to her condo with alternating gait and no difficulty ( 03/04/15)    Time 4   Period Weeks   Status On-going   PT LONG TERM GOAL #5   Title improve bilat hip strength =/> 5-/5 ( 03/04/15)    Time 4   Period Weeks   Status On-going   PT LONG TERM GOAL #6   Title report pain decrease =/> 50% with daily activity ( 03/04/15)    Time 4   Period Weeks   Status On-going   PT LONG TERM GOAL #7   Title improve FOTO =/< 47% limited ( 03/04/15)    Time 4   Period Weeks   Status On-going               Plan - 02/07/15 1012    Clinical Impression Statement Increased ROM with treatment. Patient continues to have significant tightness through hamstrings; quads; calf   Pt will benefit from skilled therapeutic intervention in order to improve on the following deficits Increased edema;Decreased strength;Decreased balance;Hypomobility;Impaired flexibility;Pain;Decreased range of motion;Abnormal gait   Rehab Potential Excellent   Clinical Impairments Affecting Rehab Potential Patient reports she was recently diagnosed with a lumbar disc issue, this may complicate her rehab if it flares up.    PT  Frequency 3x / week   PT Duration 4 weeks   PT Treatment/Interventions Ultrasound;Neuromuscular re-education;Balance training;Scar mobilization;Patient/family education;DME Instruction;Passive range of  motion;Gait training;Cryotherapy;Stair training;Dry needling;Electrical Stimulation;Taping;Moist Heat;Therapeutic exercise;Manual techniques   PT Next Visit Plan Knee ROM, strenthening    PT Home Exercise Plan HEP    Consulted and Agree with Plan of Care Patient        Problem List Patient Active Problem List   Diagnosis Date Noted  . S/P left TKA 01/14/2015  . S/P knee replacement 01/14/2015  . Thoracic back pain 04/05/2014  . Beta thalassemia, heterozygous 03/11/2014  . Right lumbar radiculopathy 11/23/2013  . Arm numbness 07/16/2013  . Right flank pain 12/20/2012  . Drowsiness 09/01/2012  . Ulcerative colitis, universal ([place]) 07/25/2012  . Routine general medical examination at a health care facility 05/19/2012  . Sebaceous cyst 05/19/2012  . THYROIDITIS 01/27/2010  . MIGRAINE HEADACHE 01/27/2010  . ALLERGIC RHINITIS 12/31/2009  . ASTHMA 12/31/2009  . PALPITATIONS 12/31/2009  . SHORTNESS OF BREATH 12/31/2009    Celyn Nilda Simmer PT, MPH  02/07/2015, 10:25 AM  [facility] [place], [place], [address] Phone: [phone]   Fax:  [phone]  Name: [name] MRN: [id number] Date of Birth: 1956/01/23

## 2015-02-12 ENCOUNTER — Ambulatory Visit (INDEPENDENT_AMBULATORY_CARE_PROVIDER_SITE_OTHER): Payer: BLUE CROSS/BLUE SHIELD | Admitting: Physical Therapy

## 2015-02-12 DIAGNOSIS — M25662 Stiffness of left knee, not elsewhere classified: Secondary | ICD-10-CM | POA: Diagnosis not present

## 2015-02-12 DIAGNOSIS — M6289 Other specified disorders of muscle: Secondary | ICD-10-CM | POA: Diagnosis not present

## 2015-02-12 DIAGNOSIS — R269 Unspecified abnormalities of gait and mobility: Secondary | ICD-10-CM | POA: Diagnosis not present

## 2015-02-12 DIAGNOSIS — M7989 Other specified soft tissue disorders: Secondary | ICD-10-CM

## 2015-02-12 DIAGNOSIS — M79662 Pain in left lower leg: Secondary | ICD-10-CM | POA: Diagnosis not present

## 2015-02-12 DIAGNOSIS — R29898 Other symptoms and signs involving the musculoskeletal system: Secondary | ICD-10-CM

## 2015-02-12 NOTE — Therapy (Signed)
[place], [place], [address] Phone: [phone]   Fax:  [phone]  Physical Therapy Treatment  Patient Details  Name: [name] MRN: [id number] Date of Birth: 02-13-1956 Referring Provider: Dr. Alvan Dame  Encounter Date: 02/12/2015      PT End of Session - 02/12/15 1602    Visit Number 4   Number of Visits 12   Date for PT Re-Evaluation 03/04/15   PT Start Time 1603   PT Stop Time 1657   PT Time Calculation (min) 54 min   Activity Tolerance Patient limited by pain      Past Medical History  Diagnosis Date  . Migraine   . Arthritis   . UC (ulcerative colitis) ([place]) 06/2012    Colonoscopy with Dr. Hilarie Fredrickson  . Microcytic anemia   . GERD (gastroesophageal reflux disease)     Past Surgical History  Procedure Laterality Date  . Abdominal hysterectomy      partial  . Knee surgery  12-13,11-10    meniscal repairs  . Breast surgery    . Colonoscopy  20 + years ago    In [place].  . Total knee arthroplasty Left 01/14/2015    Procedure: TOTAL KNEE ARTHROPLASTY;  Surgeon: Paralee Cancel, MD;  Location: [facility];  Service: Orthopedics;  Laterality: Left;    There were no vitals filed for this visit.  Visit Diagnosis:  Stiffness of knee joint, left  Weakness of both hips  Pain and swelling of lower leg, left  Abnormality of gait      Subjective Assessment - 02/12/15 1606    Subjective Pt voices frustration with persistant Lt knee pain and decreased ROM.  Now taking pain medicine 2x/day.  Pain continues to wake her up at night.    Currently in Pain? Yes   Pain Score 5    Pain Location Knee   Pain Orientation Left   Pain Descriptors / Indicators Tightness;Throbbing   Aggravating Factors  weight bearing   Pain Relieving Factors medication, ice             [facility] PT Assessment - 02/12/15 0001    Assessment   Medical Diagnosis Lt TKA   Referring Provider Dr. Alvan Dame   Onset  Date/Surgical Date 01/14/15   Hand Dominance Right   Next MD Visit 03/05/15   Prior Therapy HHPT   PROM   Left Knee Extension -8   with quad set   Left Knee Flexion 95           [facility] Adult PT Treatment/Exercise - 02/12/15 0001    Self-Care   Self-Care Scar Mobilizations;Other Self-Care Comments   Scar Mobilizations pt educated on scar massage/ MFR around scar    Other Self-Care Comments  Pt instructed in massage to desensitize ant Lt knee    Exercises   Exercises Knee/Hip   Knee/Hip Exercises: Stretches   Passive Hamstring Stretch Left;3 reps;30 seconds  supine with strap   Gastroc Stretch 2 reps;Left;30 seconds   Other Knee/Hip Stretches Seated scoots to increase Lt knee ROM 10 sec hold x 10 reps    Knee/Hip Exercises: Aerobic   Nustep L3 x 6 min  partial ROM initially   Knee/Hip Exercises: Supine   Quad Sets Left;1 set;10 reps  10 sec hold    Heel Slides Left;5 reps;AAROM  with strap.  Stopped 2x due to HS cramp   Modalities   Modalities Designer, multimedia  Location Lt knee   Electrical Stimulation Action ion repelling   Electrical Stimulation Parameters to tolerance    Electrical Stimulation Goals Edema;Pain   Vasopneumatic   Number Minutes Vasopneumatic  15 minutes   Vasopnuematic Location  Knee   Vasopneumatic Pressure Medium   Vasopneumatic Temperature  3*   Manual Therapy   Manual Therapy Taping   Passive ROM Lt knee flexion in supine; extension in supine x3 each    Kinesiotex Edema  web pattern over Lt ant knee            PT Long Term Goals - 02/07/15 1024    PT LONG TERM GOAL #1   Title I with advanced HEP ( 03/04/15)    Time 4   Period Weeks   Status On-going   PT LONG TERM GOAL #2   Title increase Lt knee flexion =/> 105 degrees to assist with stairs ( 03/04/15)    Time 4   Period Weeks   Status On-going   PT LONG TERM GOAL #3   Title ambulate with heel toe pattern without  an assistive device ( 03/04/15)    Time 4   Period Weeks   Status On-going   PT LONG TERM GOAL #4   Title ambulate up/down steps to her condo with alternating gait and no difficulty ( 03/04/15)    Time 4   Period Weeks   Status On-going   PT LONG TERM GOAL #5   Title improve bilat hip strength =/> 5-/5 ( 03/04/15)    Time 4   Period Weeks   Status On-going   PT LONG TERM GOAL #6   Title report pain decrease =/> 50% with daily activity ( 03/04/15)    Time 4   Period Weeks   Status On-going   PT LONG TERM GOAL #7   Title improve FOTO =/< 47% limited ( 03/04/15)    Time 4   Period Weeks   Status On-going               Plan - 02/12/15 1659    Clinical Impression Statement Pt with decreased tolerance for exercise today, some cramping in Lt hamstrings. Pt reported decrease in pain and tightness with use of vaso/estim at end of treatment. No goals met yet.    Pt will benefit from skilled therapeutic intervention in order to improve on the following deficits Increased edema;Decreased strength;Decreased balance;Hypomobility;Impaired flexibility;Pain;Decreased range of motion;Abnormal gait   Rehab Potential Excellent   Clinical Impairments Affecting Rehab Potential Patient reports she was recently diagnosed with a lumbar disc issue, this may complicate her rehab if it flares up.    PT Frequency 3x / week   PT Duration 4 weeks   PT Treatment/Interventions Ultrasound;Neuromuscular re-education;Balance training;Scar mobilization;Patient/family education;DME Instruction;Passive range of motion;Gait training;Cryotherapy;Stair training;Dry needling;Electrical Stimulation;Taping;Moist Heat;Therapeutic exercise;Manual techniques   PT Next Visit Plan Continue progressive ROM / strengthening to Lt knee.  Assess response to ktape; retape if helpful .   Consulted and Agree with Plan of Care Patient        Problem List Patient Active Problem List   Diagnosis Date Noted  . S/P left TKA  01/14/2015  . S/P knee replacement 01/14/2015  . Thoracic back pain 04/05/2014  . Beta thalassemia, heterozygous 03/11/2014  . Right lumbar radiculopathy 11/23/2013  . Arm numbness 07/16/2013  . Right flank pain 12/20/2012  . Drowsiness 09/01/2012  . Ulcerative colitis, universal ([place]) 07/25/2012  . Routine general medical examination at a health care facility  05/19/2012  . Sebaceous cyst 05/19/2012  . THYROIDITIS 01/27/2010  . MIGRAINE HEADACHE 01/27/2010  . ALLERGIC RHINITIS 12/31/2009  . ASTHMA 12/31/2009  . PALPITATIONS 12/31/2009  . SHORTNESS OF BREATH 12/31/2009    Kerin Perna, PTA 02/12/2015 5:03 PM  [place], [place], [address] Phone: [phone]   Fax:  [phone]  Name: Emnet Monk MRN: [id number] Date of Birth: 01-Aug-1955

## 2015-02-13 ENCOUNTER — Encounter: Payer: BLUE CROSS/BLUE SHIELD | Admitting: Physical Therapy

## 2015-02-14 ENCOUNTER — Ambulatory Visit (INDEPENDENT_AMBULATORY_CARE_PROVIDER_SITE_OTHER): Payer: BLUE CROSS/BLUE SHIELD | Admitting: Physical Therapy

## 2015-02-14 DIAGNOSIS — M6289 Other specified disorders of muscle: Secondary | ICD-10-CM

## 2015-02-14 DIAGNOSIS — R29898 Other symptoms and signs involving the musculoskeletal system: Secondary | ICD-10-CM

## 2015-02-14 DIAGNOSIS — M7989 Other specified soft tissue disorders: Secondary | ICD-10-CM

## 2015-02-14 DIAGNOSIS — M25662 Stiffness of left knee, not elsewhere classified: Secondary | ICD-10-CM

## 2015-02-14 DIAGNOSIS — R269 Unspecified abnormalities of gait and mobility: Secondary | ICD-10-CM | POA: Diagnosis not present

## 2015-02-14 DIAGNOSIS — M79662 Pain in left lower leg: Secondary | ICD-10-CM | POA: Diagnosis not present

## 2015-02-14 NOTE — Therapy (Signed)
[place], [place], [address] Phone: [phone]   Fax:  [phone]  Physical Therapy Treatment  Patient Details  Name: [name] MRN: [id number] Date of Birth: Jul 08, 1955 Referring Provider: Dr. Alvan Dame  Encounter Date: 02/14/2015      PT End of Session - 02/14/15 1332    Visit Number 5   Number of Visits 12   Date for PT Re-Evaluation 03/04/15   PT Start Time 1332   PT Stop Time 1425   PT Time Calculation (min) 53 min      Past Medical History  Diagnosis Date  . Migraine   . Arthritis   . UC (ulcerative colitis) ([place]) 06/2012    Colonoscopy with Dr. Hilarie Fredrickson  . Microcytic anemia   . GERD (gastroesophageal reflux disease)     Past Surgical History  Procedure Laterality Date  . Abdominal hysterectomy      partial  . Knee surgery  12-13,11-10    meniscal repairs  . Breast surgery    . Colonoscopy  20 + years ago    In [place].  . Total knee arthroplasty Left 01/14/2015    Procedure: TOTAL KNEE ARTHROPLASTY;  Surgeon: Paralee Cancel, MD;  Location: [facility];  Service: Orthopedics;  Laterality: Left;    There were no vitals filed for this visit.  Visit Diagnosis:  Stiffness of knee joint, left  Weakness of both hips  Pain and swelling of lower leg, left  Abnormality of gait      Subjective Assessment - 02/14/15 1333    Subjective 4/10. called doctor because she ran out of meds, she has more now. still taking medds 2X per day.    Currently in Pain? Yes   Pain Score 4    Pain Descriptors / Indicators Constant   Pain Type Surgical pain   Pain Onset 1 to 4 weeks ago   Multiple Pain Sites No                         [facility] Adult PT Treatment/Exercise - 02/14/15 0001    Ambulation/Gait   Gait velocity - backwards step to pattern   Gait Comments gait    Self-Care   Self-Care Scar Mobilizations   Exercises   Exercises Knee/Hip   Knee/Hip Exercises:  Stretches   Active Hamstring Stretch Left  60"   Passive Hamstring Stretch Left  60" 3 reps   Gastroc Stretch 2 reps   Other Knee/Hip Stretches seated scoots   Knee/Hip Exercises: Aerobic   Stationary Bike L0, 1/2 revolutions into stretch   Modalities   Modalities Pharmacologist Action ion   Electrical Stimulation Parameters to tolerance   Electrical Stimulation Goals Edema   Vasopneumatic   Number Minutes Vasopneumatic  15 minutes   Vasopnuematic Location  Knee   Vasopneumatic Pressure Medium   Vasopneumatic Temperature  3   Manual Therapy   Manual Therapy Taping   Passive ROM lt knee flexion /extension   Kinesiotex Edema                     PT Long Term Goals - 02/07/15 1024    PT LONG TERM GOAL #1   Title I with advanced HEP ( 03/04/15)    Time 4   Period Weeks   Status On-going   PT LONG TERM GOAL #2  Title increase Lt knee flexion =/> 105 degrees to assist with stairs ( 03/04/15)    Time 4   Period Weeks   Status On-going   PT LONG TERM GOAL #3   Title ambulate with heel toe pattern without an assistive device ( 03/04/15)    Time 4   Period Weeks   Status On-going   PT LONG TERM GOAL #4   Title ambulate up/down steps to her condo with alternating gait and no difficulty ( 03/04/15)    Time 4   Period Weeks   Status On-going   PT LONG TERM GOAL #5   Title improve bilat hip strength =/> 5-/5 ( 03/04/15)    Time 4   Period Weeks   Status On-going   PT LONG TERM GOAL #6   Title report pain decrease =/> 50% with daily activity ( 03/04/15)    Time 4   Period Weeks   Status On-going   PT LONG TERM GOAL #7   Title improve FOTO =/< 47% limited ( 03/04/15)    Time 4   Period Weeks   Status On-going               Problem List Patient Active Problem List   Diagnosis Date Noted  . S/P left TKA 01/14/2015  . S/P knee replacement 01/14/2015  .  Thoracic back pain 04/05/2014  . Beta thalassemia, heterozygous 03/11/2014  . Right lumbar radiculopathy 11/23/2013  . Arm numbness 07/16/2013  . Right flank pain 12/20/2012  . Drowsiness 09/01/2012  . Ulcerative colitis, universal ([place]) 07/25/2012  . Routine general medical examination at a health care facility 05/19/2012  . Sebaceous cyst 05/19/2012  . THYROIDITIS 01/27/2010  . MIGRAINE HEADACHE 01/27/2010  . ALLERGIC RHINITIS 12/31/2009  . ASTHMA 12/31/2009  . PALPITATIONS 12/31/2009  . SHORTNESS OF BREATH 12/31/2009     Natividad Brood, PTA  02/14/2015, 2:08 PM  [facility] [place], [place], [address] Phone: [phone]   Fax:  [phone]  Name: [name] MRN: [id number] Date of Birth: June 02, 1955

## 2015-02-14 NOTE — Patient Instructions (Signed)
Pt to elevate knee above heart with LE on couch back, ice pack for 12-15 minutes several times daily.

## 2015-02-18 ENCOUNTER — Encounter: Payer: Self-pay | Admitting: Physical Therapy

## 2015-02-18 ENCOUNTER — Ambulatory Visit (INDEPENDENT_AMBULATORY_CARE_PROVIDER_SITE_OTHER): Payer: BLUE CROSS/BLUE SHIELD | Admitting: Physical Therapy

## 2015-02-18 DIAGNOSIS — M6289 Other specified disorders of muscle: Secondary | ICD-10-CM | POA: Diagnosis not present

## 2015-02-18 DIAGNOSIS — R269 Unspecified abnormalities of gait and mobility: Secondary | ICD-10-CM | POA: Diagnosis not present

## 2015-02-18 DIAGNOSIS — M7989 Other specified soft tissue disorders: Secondary | ICD-10-CM

## 2015-02-18 DIAGNOSIS — R29898 Other symptoms and signs involving the musculoskeletal system: Secondary | ICD-10-CM

## 2015-02-18 DIAGNOSIS — M79662 Pain in left lower leg: Secondary | ICD-10-CM | POA: Diagnosis not present

## 2015-02-18 DIAGNOSIS — M25662 Stiffness of left knee, not elsewhere classified: Secondary | ICD-10-CM

## 2015-02-18 NOTE — Therapy (Signed)
[facility] [place], [place], [address] Phone: [phone]   Fax:  [phone]  Physical Therapy Treatment  Patient Details  Name: [name] MRN: [id number] Date of Birth: Sep 15, 1955 Referring Provider: Dr Alvan Dame  Encounter Date: 02/18/2015      PT End of Session - 02/18/15 0808    Visit Number 6   Number of Visits 12   Date for PT Re-Evaluation 03/04/15   PT Start Time 0807   PT Stop Time 0856   PT Time Calculation (min) 49 min   Activity Tolerance Patient tolerated treatment well      Past Medical History  Diagnosis Date  . Migraine   . Arthritis   . UC (ulcerative colitis) ([place]) 06/2012    Colonoscopy with Dr. Hilarie Fredrickson  . Microcytic anemia   . GERD (gastroesophageal reflux disease)     Past Surgical History  Procedure Laterality Date  . Abdominal hysterectomy      partial  . Knee surgery  12-13,11-10    meniscal repairs  . Breast surgery    . Colonoscopy  20 + years ago    In [place].  . Total knee arthroplasty Left 01/14/2015    Procedure: TOTAL KNEE ARTHROPLASTY;  Surgeon: Paralee Cancel, MD;  Location: [facility];  Service: Orthopedics;  Laterality: Left;    There were no vitals filed for this visit.  Visit Diagnosis:  Stiffness of knee joint, left  Weakness of both hips  Pain and swelling of lower leg, left  Abnormality of gait      Subjective Assessment - 02/18/15 0809    Subjective Pt reports she is feeling pretty good, really wants to get the knee moving more. Pain is constant still however not as intense. Having some issues with dizziness today   Patient Stated Goals walk without pain and swelling   Pain Score 4    Pain Location Knee   Pain Orientation Left   Pain Descriptors / Indicators Constant   Pain Onset More than a month ago   Pain Frequency Constant   Aggravating Factors  being up and moving around   Pain Relieving Factors medication and ice             [facility] PT Assessment - 02/18/15 0001    Assessment   Medical Diagnosis Lt TKA   Referring Provider Dr Alvan Dame   Onset Date/Surgical Date 01/14/15   Hand Dominance Right   Next MD Visit 03/05/15   Prior Therapy HHPT   AROM   AROM Assessment Site Knee   Right/Left Knee Left   Left Knee Extension -2   Left Knee Flexion 99   PROM   PROM Assessment Site Knee   Right/Left Knee Left   Left Knee Extension 0   Left Knee Flexion 105   Strength   Left Hip Flexion --  5-/5                     [facility] Adult PT Treatment/Exercise - 02/18/15 0001    Exercises   Exercises Knee/Hip   Knee/Hip Exercises: Stretches   Passive Hamstring Stretch Left;3 reps;30 seconds  with strap and knee presses   Gastroc Stretch Both;2 reps;30 seconds   Other Knee/Hip Stretches self knee mobs in hooklying with scooting FWD   Knee/Hip Exercises: Aerobic   Stationary Bike L0, 1/2 revolutions into stretch   Knee/Hip Exercises: Standing   Lateral Step Up Left;2 sets;10 reps;Step Height: 6"   Forward  Step Up Left;2 sets;10 reps;Step Height: 8"   Knee/Hip Exercises: Prone   Hamstring Curl 3 sets;10 reps  Lt with 1#   Modalities   Modalities Pharmacologist Action ion repelling   Electrical Stimulation Parameters to tolerance   Electrical Stimulation Goals Edema   Vasopneumatic   Number Minutes Vasopneumatic  15 minutes   Vasopnuematic Location  Knee   Vasopneumatic Pressure Medium   Vasopneumatic Temperature  3                     PT Long Term Goals - 02/18/15 6283    PT LONG TERM GOAL #1   Title I with advanced HEP ( 03/04/15)    Time 4   Period Weeks   Status On-going   PT LONG TERM GOAL #2   Title increase Lt knee flexion =/> 105 degrees to assist with stairs ( 03/04/15)    Time 4   Period Days   Status On-going  met passively, not active yet   PT LONG TERM GOAL #3   Title  ambulate with heel toe pattern without an assistive device ( 03/04/15)    Time 4   Period Weeks   Status On-going   PT LONG TERM GOAL #4   Title ambulate up/down steps to her condo with alternating gait and no difficulty ( 03/04/15)    Time 4   Period Weeks   Status On-going   PT LONG TERM GOAL #5   Title improve bilat hip strength =/> 5-/5 ( 03/04/15)    Time 4   Period Weeks   Status On-going   PT LONG TERM GOAL #6   Title report pain decrease =/> 50% with daily activity ( 03/04/15)    Time 4   Period Weeks   Status On-going   PT LONG TERM GOAL #7   Title improve FOTO =/< 47% limited ( 03/04/15)    Time 4   Period Weeks   Status On-going               Plan - 02/18/15 0839    Clinical Impression Statement Pt ROM and strength is improving, she continues to have edema in the knee and gait abnormalties.  Progressing towards her goals.    Pt will benefit from skilled therapeutic intervention in order to improve on the following deficits Increased edema;Decreased strength;Decreased balance;Hypomobility;Impaired flexibility;Pain;Decreased range of motion;Abnormal gait   Rehab Potential Excellent   Clinical Impairments Affecting Rehab Potential Patient reports she was recently diagnosed with a lumbar disc issue, this may complicate her rehab if it flares up.    PT Treatment/Interventions Ultrasound;Neuromuscular re-education;Balance training;Scar mobilization;Patient/family education;DME Instruction;Passive range of motion;Gait training;Cryotherapy;Stair training;Dry needling;Electrical Stimulation;Taping;Moist Heat;Therapeutic exercise;Manual techniques   PT Next Visit Plan cont with ROM, strengthening.  tape if she feels like it helped two visits ago   Consulted and Agree with Plan of Care Patient        Problem List Patient Active Problem List   Diagnosis Date Noted  . S/P left TKA 01/14/2015  . S/P knee replacement 01/14/2015  . Thoracic back pain 04/05/2014  . Beta  thalassemia, heterozygous 03/11/2014  . Right lumbar radiculopathy 11/23/2013  . Arm numbness 07/16/2013  . Right flank pain 12/20/2012  . Drowsiness 09/01/2012  . Ulcerative colitis, universal ([place]) 07/25/2012  . Routine general medical examination at a health care facility 05/19/2012  . Sebaceous cyst 05/19/2012  .  THYROIDITIS 01/27/2010  . MIGRAINE HEADACHE 01/27/2010  . ALLERGIC RHINITIS 12/31/2009  . ASTHMA 12/31/2009  . PALPITATIONS 12/31/2009  . SHORTNESS OF BREATH 12/31/2009    Jeral Pinch PT 02/18/2015, 8:47 AM  [facility] [place], [place], [address] Phone: [phone]   Fax:  [phone]  Name: [name] MRN: [id number] Date of Birth: 09-03-1955

## 2015-02-20 ENCOUNTER — Encounter: Payer: BLUE CROSS/BLUE SHIELD | Admitting: Rehabilitative and Restorative Service Providers"

## 2015-02-20 ENCOUNTER — Ambulatory Visit (INDEPENDENT_AMBULATORY_CARE_PROVIDER_SITE_OTHER): Payer: BLUE CROSS/BLUE SHIELD | Admitting: Physical Therapy

## 2015-02-20 DIAGNOSIS — M7989 Other specified soft tissue disorders: Secondary | ICD-10-CM

## 2015-02-20 DIAGNOSIS — R269 Unspecified abnormalities of gait and mobility: Secondary | ICD-10-CM

## 2015-02-20 DIAGNOSIS — M79662 Pain in left lower leg: Secondary | ICD-10-CM

## 2015-02-20 DIAGNOSIS — M6289 Other specified disorders of muscle: Secondary | ICD-10-CM | POA: Diagnosis not present

## 2015-02-20 DIAGNOSIS — R29898 Other symptoms and signs involving the musculoskeletal system: Secondary | ICD-10-CM

## 2015-02-20 DIAGNOSIS — M25662 Stiffness of left knee, not elsewhere classified: Secondary | ICD-10-CM | POA: Diagnosis not present

## 2015-02-20 NOTE — Therapy (Signed)
[facility] [address] [place], [place], [address] Phone: [phone]   Fax:  [phone]  Physical Therapy Treatment  Patient Details  Name: [name] MRN: [id number] Date of Birth: 06/12/1955 Referring Provider: Dr. Olin  Encounter Date: 02/20/2015      PT End of Session - 02/20/15 0852    Visit Number 7   Number of Visits 12   Date for PT Re-Evaluation 03/04/15   PT Start Time 0845   PT Stop Time 0948   PT Time Calculation (min) 63 min      Past Medical History  Diagnosis Date  . Migraine   . Arthritis   . UC (ulcerative colitis) ([facility]) 06/2012    Colonoscopy with Dr. Pyrtle  . Microcytic anemia   . GERD (gastroesophageal reflux disease)     Past Surgical History  Procedure Laterality Date  . Abdominal hysterectomy      partial  . Knee surgery  12-13,11-10    meniscal repairs  . Breast surgery    . Colonoscopy  20 + years ago    In [place],had [facility]  . Total knee arthroplasty Left 01/14/2015    Procedure: TOTAL KNEE ARTHROPLASTY;  Surgeon: Matthew Olin, MD;  Location: [facility];  Service: Orthopedics;  Laterality: Left;    There were no vitals filed for this visit.  Visit Diagnosis:  Stiffness of knee joint, left  Weakness of both hips  Pain and swelling of lower leg, left  Abnormality of gait      Subjective Assessment - 02/20/15 0852    Subjective Pt drove herself for first time today.  Still having dizziness issues she believes is related to pain medicine.  Taking pain med and muscle relaxers 2x/day.  She's pleased she's able to do more now.    Currently in Pain? Yes   Pain Score 5    Pain Orientation Left   Pain Descriptors / Indicators Throbbing   Aggravating Factors  prolonged standing/walking.    Pain Relieving Factors medication and ice.             [facility] PT Assessment - 02/20/15 0001    Assessment   Medical Diagnosis Lt TKA   Referring Provider Dr. Olin   Onset  Date/Surgical Date 01/14/15   Hand Dominance Right   Next MD Visit 03/05/15   Prior Therapy HHPT          [facility] Adult PT Treatment/Exercise - 02/20/15 0001    Ambulation/Gait   Pre-Gait Activities Multiple trials of increased step height for 25 ft to decrease Lt circumduction; trials with walking to beat of metronome to even step length followed by multiple gait trials of 25 ft with VC.    Gait Comments Gait initially demonstrated Lt circumduction, increased step length Lt, Increased wt shift Rt.  Improved quality and minized gait devations after pre-gait activities. Good carry over.    Exercises   Exercises Knee/Hip   Knee/Hip Exercises: Stretches   Passive Hamstring Stretch Right;Left;2 reps;30 seconds   Gastroc Stretch 4 reps;Right;Left;30 seconds   Knee/Hip Exercises: Aerobic   Stationary Bike L0, 1/2 revolutions into stretch x 7 min to increase Lt knee ROM.   Knee/Hip Exercises: Standing   Lateral Step Up Left;2 sets;10 reps;Hand Hold: 2;Step Height: 6"   Lateral Step Up Limitations VC for improved form   Forward Step Up Left;2 sets;10 reps;Hand Hold: 2;Step Height: 6"   SLS Rt/Lt 3 trials each side 10-15 sec without UE support     Dealer Parameters to tolerance    Electrical Stimulation Goals Edema;Pain   Vasopneumatic   Number Minutes Vasopneumatic  15 minutes   Vasopnuematic Location  Knee   Vasopneumatic Pressure Medium   Vasopneumatic Temperature  3 snowflakes           PT Long Term Goals - 02/18/15 2703    PT LONG TERM GOAL #1   Title I with advanced HEP ( 03/04/15)    Time 4   Period Weeks   Status On-going   PT LONG TERM GOAL #2   Title increase Lt knee flexion =/> 105 degrees to assist with stairs ( 03/04/15)    Time 4   Period Days   Status On-going  met passively, not active yet   PT LONG TERM GOAL #3   Title ambulate with heel toe  pattern without an assistive device ( 03/04/15)    Time 4   Period Weeks   Status On-going   PT LONG TERM GOAL #4   Title ambulate up/down steps to her condo with alternating gait and no difficulty ( 03/04/15)    Time 4   Period Weeks   Status On-going   PT LONG TERM GOAL #5   Title improve bilat hip strength =/> 5-/5 ( 03/04/15)    Time 4   Period Weeks   Status On-going   PT LONG TERM GOAL #6   Title report pain decrease =/> 50% with daily activity ( 03/04/15)    Time 4   Period Weeks   Status On-going   PT LONG TERM GOAL #7   Title improve FOTO =/< 47% limited ( 03/04/15)    Time 4   Period Weeks   Status On-going               Plan - 02/20/15 1036    Clinical Impression Statement Pt tolerated all exercises with minimal increase in pain. Pt demonstrated improved gait quality with pre-gait activities and VC.  Pt reported decrease of Lt knee pain after use of estim/vaso at end of session.  Progressing towards goals.    Pt will benefit from skilled therapeutic intervention in order to improve on the following deficits Increased edema;Decreased strength;Decreased balance;Hypomobility;Impaired flexibility;Pain;Decreased range of motion;Abnormal gait   Rehab Potential Excellent   Clinical Impairments Affecting Rehab Potential Patient reports she was recently diagnosed with a lumbar disc issue, this may complicate her rehab if it flares up.    PT Frequency 3x / week   PT Duration 4 weeks   PT Treatment/Interventions Ultrasound;Neuromuscular re-education;Balance training;Scar mobilization;Patient/family education;DME Instruction;Passive range of motion;Gait training;Cryotherapy;Stair training;Dry needling;Electrical Stimulation;Taping;Moist Heat;Therapeutic exercise;Manual techniques   PT Next Visit Plan cont with ROM, strengthening.  tape if she feels like it helped three visits ago   Consulted and Agree with Plan of Care Patient        Problem List Patient Active Problem  List   Diagnosis Date Noted  . S/P left TKA 01/14/2015  . S/P knee replacement 01/14/2015  . Thoracic back pain 04/05/2014  . Beta thalassemia, heterozygous 03/11/2014  . Right lumbar radiculopathy 11/23/2013  . Arm numbness 07/16/2013  . Right flank pain 12/20/2012  . Drowsiness 09/01/2012  . Ulcerative colitis, universal ([place]) 07/25/2012  . Routine general medical examination at a health care facility 05/19/2012  . Sebaceous cyst 05/19/2012  . THYROIDITIS 01/27/2010  . MIGRAINE HEADACHE 01/27/2010  .  ALLERGIC RHINITIS 12/31/2009  . ASTHMA 12/31/2009  . PALPITATIONS 12/31/2009  . SHORTNESS OF BREATH 12/31/2009    Jennifer Carlson-Long, PTA 02/20/2015 10:40 AM  [facility] [address] [place], [place], [address] Phone: [phone]   Fax:  [phone]  Name: [name] MRN: [id number] Date of Birth: 04/04/1955     

## 2015-02-24 ENCOUNTER — Encounter: Payer: BLUE CROSS/BLUE SHIELD | Admitting: Physical Therapy

## 2015-02-27 ENCOUNTER — Encounter: Payer: Self-pay | Admitting: Physical Therapy

## 2015-02-27 ENCOUNTER — Ambulatory Visit (INDEPENDENT_AMBULATORY_CARE_PROVIDER_SITE_OTHER): Payer: BLUE CROSS/BLUE SHIELD | Admitting: Rehabilitative and Restorative Service Providers"

## 2015-02-27 DIAGNOSIS — M7989 Other specified soft tissue disorders: Secondary | ICD-10-CM

## 2015-02-27 DIAGNOSIS — M25662 Stiffness of left knee, not elsewhere classified: Secondary | ICD-10-CM | POA: Diagnosis not present

## 2015-02-27 DIAGNOSIS — M6289 Other specified disorders of muscle: Secondary | ICD-10-CM

## 2015-02-27 DIAGNOSIS — M79662 Pain in left lower leg: Secondary | ICD-10-CM | POA: Diagnosis not present

## 2015-02-27 DIAGNOSIS — R269 Unspecified abnormalities of gait and mobility: Secondary | ICD-10-CM

## 2015-02-27 DIAGNOSIS — R29898 Other symptoms and signs involving the musculoskeletal system: Secondary | ICD-10-CM

## 2015-02-27 NOTE — Therapy (Signed)
[facility] [place], [place], [address] Phone: [phone]   Fax:  [phone]  Physical Therapy Treatment  Patient Details  Name: [name] MRN: [id number] Date of Birth: February 08, 1956 Referring Provider: Alvan Dame  Encounter Date: 02/27/2015      PT End of Session - 02/27/15 1610    Visit Number 8   Number of Visits 12   Date for PT Re-Evaluation 03/04/15   PT Start Time 1611   PT Stop Time 1708   PT Time Calculation (min) 57 min   Activity Tolerance Patient tolerated treatment well      Past Medical History  Diagnosis Date  . Migraine   . Arthritis   . UC (ulcerative colitis) ([place]) 06/2012    Colonoscopy with Dr. Hilarie Fredrickson  . Microcytic anemia   . GERD (gastroesophageal reflux disease)     Past Surgical History  Procedure Laterality Date  . Abdominal hysterectomy      partial  . Knee surgery  12-13,11-10    meniscal repairs  . Breast surgery    . Colonoscopy  20 + years ago    In [place].  . Total knee arthroplasty Left 01/14/2015    Procedure: TOTAL KNEE ARTHROPLASTY;  Surgeon: Paralee Cancel, MD;  Location: [facility];  Service: Orthopedics;  Laterality: Left;    There were no vitals filed for this visit.  Visit Diagnosis:  Stiffness of knee joint, left  Weakness of both hips  Pain and swelling of lower leg, left  Abnormality of gait      Subjective Assessment - 02/27/15 1613    Subjective Pt reports that she is driving now and has returned to work. She is working from home and can move about and do her exercses spome during the day. She is trying the steps but it still hurts to do step over step.    Currently in Pain? Yes   Pain Score 3    Pain Location Knee   Pain Orientation Left   Pain Descriptors / Indicators Throbbing   Pain Type Surgical pain   Pain Onset More than a month ago   Pain Frequency Constant            [facility] PT Assessment - 02/27/15 0001    Assessment    Medical Diagnosis Lt TKA   Referring Provider Alvan Dame   Onset Date/Surgical Date 01/14/15   Hand Dominance Right   Next MD Visit 03/05/15   Prior Therapy HHPT   AROM   Left Knee Extension -1   Left Knee Flexion 101   PROM   Left Knee Extension 0                     [facility] Adult PT Treatment/Exercise - 02/27/15 0001    Ambulation/Gait   Gait Comments working on gait with equal stride length and rate 40 ft x 10 with verbal cues and metronome    Exercises   Exercises Knee/Hip   Knee/Hip Exercises: Stretches   Passive Hamstring Stretch Right;Left;2 reps;30 seconds   Gastroc Stretch 4 reps;Right;Left;30 seconds   Other Knee/Hip Stretches knee flexion sitting scooting forward 10-20 sec hold x 5-7    Other Knee/Hip Stretches supine large ball for knee flexion supine 20 sec x 5    Knee/Hip Exercises: Aerobic   Stationary Bike L0, full revolutions into stretch x 10 min    Knee/Hip Exercises: Standing   SLS Rt/Lt 3 trials each side 10-15 sec without  UE support   Knee/Hip Exercises: Supine   Quad Sets Left;1 set;10 reps  10 sec hold    Heel Slides Left;5 reps;AAROM   Straight Leg Raises Left;1 set;10 reps  with quad set 5 sec hold    Modalities   Modalities Electrical Stimulation   Electrical Stimulation   Electrical Stimulation Location 15   Electrical Stimulation Action ion repelling    Electrical Stimulation Parameters to tolerance   Electrical Stimulation Goals Edema;Pain   Vasopneumatic   Number Minutes Vasopneumatic  15 minutes   Vasopnuematic Location  Knee   Vasopneumatic Pressure Medium   Vasopneumatic Temperature  3 snowflakes                 PT Education - 02/27/15 1643    Education provided Yes   Education Details HEP; work on gait without assistive device   Person(s) Educated Patient   Methods Explanation;Demonstration;Tactile cues;Verbal cues   Comprehension Verbalized understanding;Returned demonstration;Verbal cues required;Tactile cues  required             PT Long Term Goals - 02/18/15 0837    PT LONG TERM GOAL #1   Title I with advanced HEP ( 03/04/15)    Time 4   Period Weeks   Status On-going   PT LONG TERM GOAL #2   Title increase Lt knee flexion =/> 105 degrees to assist with stairs ( 03/04/15)    Time 4   Period Days   Status On-going  met passively, not active yet   PT LONG TERM GOAL #3   Title ambulate with heel toe pattern without an assistive device ( 03/04/15)    Time 4   Period Weeks   Status On-going   PT LONG TERM GOAL #4   Title ambulate up/down steps to her condo with alternating gait and no difficulty ( 03/04/15)    Time 4   Period Weeks   Status On-going   PT LONG TERM GOAL #5   Title improve bilat hip strength =/> 5-/5 ( 03/04/15)    Time 4   Period Weeks   Status On-going   PT LONG TERM GOAL #6   Title report pain decrease =/> 50% with daily activity ( 03/04/15)    Time 4   Period Weeks   Status On-going   PT LONG TERM GOAL #7   Title improve FOTO =/< 47% limited ( 03/04/15)    Time 4   Period Weeks   Status On-going               Plan - 02/27/15 1611    Clinical Impression Statement Continues to work on exercise program at home and tolerates exercise well in clinic. Demonstrates continued improvement in knee mobility; gait; activity tolerance. Continues to progress well toward stated goals of treatment.    Pt will benefit from skilled therapeutic intervention in order to improve on the following deficits Increased edema;Decreased strength;Decreased balance;Hypomobility;Impaired flexibility;Pain;Decreased range of motion;Abnormal gait   Rehab Potential Excellent   Clinical Impairments Affecting Rehab Potential Patient reports she was recently diagnosed with a lumbar disc issue, this may complicate her rehab if it flares up.    PT Frequency 2x / week   PT Duration 4 weeks   PT Treatment/Interventions Ultrasound;Neuromuscular re-education;Balance training;Scar  mobilization;Patient/family education;DME Instruction;Passive range of motion;Gait training;Cryotherapy;Stair training;Dry needling;Electrical Stimulation;Taping;Moist Heat;Therapeutic exercise;Manual techniques   PT Next Visit Plan cont with ROM, strengthening.  tape if she feels like it helped three visits ago   PT Home  Exercise Plan HEP    Consulted and Agree with Plan of Care Patient        Problem List Patient Active Problem List   Diagnosis Date Noted  . S/P left TKA 01/14/2015  . S/P knee replacement 01/14/2015  . Thoracic back pain 04/05/2014  . Beta thalassemia, heterozygous 03/11/2014  . Right lumbar radiculopathy 11/23/2013  . Arm numbness 07/16/2013  . Right flank pain 12/20/2012  . Drowsiness 09/01/2012  . Ulcerative colitis, universal ([place]) 07/25/2012  . Routine general medical examination at a health care facility 05/19/2012  . Sebaceous cyst 05/19/2012  . THYROIDITIS 01/27/2010  . MIGRAINE HEADACHE 01/27/2010  . ALLERGIC RHINITIS 12/31/2009  . ASTHMA 12/31/2009  . PALPITATIONS 12/31/2009  . SHORTNESS OF BREATH 12/31/2009    Shantinique Picazo Nilda Simmer PT, MPH  02/27/2015, 4:58 PM  [facility] [place], [place], [address] Phone: [phone]   Fax:  [phone]  Name: [name] MRN: [id number] Date of Birth: 05/18/1955

## 2015-03-04 ENCOUNTER — Encounter: Payer: Self-pay | Admitting: Physical Therapy

## 2015-03-06 ENCOUNTER — Encounter: Payer: Self-pay | Admitting: Physical Therapy

## 2015-03-06 ENCOUNTER — Ambulatory Visit (INDEPENDENT_AMBULATORY_CARE_PROVIDER_SITE_OTHER): Payer: BLUE CROSS/BLUE SHIELD | Admitting: Physical Therapy

## 2015-03-06 DIAGNOSIS — M7989 Other specified soft tissue disorders: Secondary | ICD-10-CM

## 2015-03-06 DIAGNOSIS — M79662 Pain in left lower leg: Secondary | ICD-10-CM

## 2015-03-06 DIAGNOSIS — M6289 Other specified disorders of muscle: Secondary | ICD-10-CM | POA: Diagnosis not present

## 2015-03-06 DIAGNOSIS — R29898 Other symptoms and signs involving the musculoskeletal system: Secondary | ICD-10-CM

## 2015-03-06 DIAGNOSIS — M25662 Stiffness of left knee, not elsewhere classified: Secondary | ICD-10-CM

## 2015-03-06 DIAGNOSIS — R269 Unspecified abnormalities of gait and mobility: Secondary | ICD-10-CM | POA: Diagnosis not present

## 2015-03-06 NOTE — Therapy (Signed)
[place], [place], [address] Phone: [phone]   Fax:  [phone]  Physical Therapy Treatment  Patient Details  Name: [name] MRN: [id number] Date of Birth: Jan 29, 1956 Referring Provider: Dr Alvan Dame  Encounter Date: 03/06/2015      PT End of Session - 03/06/15 1541    Visit Number 9   Number of Visits 12   Date for PT Re-Evaluation 03/04/15   PT Start Time 5732   PT Stop Time 1630   PT Time Calculation (min) 55 min   Activity Tolerance Patient tolerated treatment well;No increased pain      Past Medical History  Diagnosis Date  . Migraine   . Arthritis   . UC (ulcerative colitis) ([place]) 06/2012    Colonoscopy with Dr. Hilarie Fredrickson  . Microcytic anemia   . GERD (gastroesophageal reflux disease)     Past Surgical History  Procedure Laterality Date  . Abdominal hysterectomy      partial  . Knee surgery  12-13,11-10    meniscal repairs  . Breast surgery    . Colonoscopy  20 + years ago    In [place].  . Total knee arthroplasty Left 01/14/2015    Procedure: TOTAL KNEE ARTHROPLASTY;  Surgeon: Paralee Cancel, MD;  Location: [facility];  Service: Orthopedics;  Laterality: Left;    There were no vitals filed for this visit.  Visit Diagnosis:  Stiffness of knee joint, left - Plan: PT plan of care cert/re-cert  Weakness of both hips - Plan: PT plan of care cert/re-cert  Pain and swelling of lower leg, left - Plan: PT plan of care cert/re-cert  Abnormality of gait - Plan: PT plan of care cert/re-cert      Subjective Assessment - 03/06/15 1538    Subjective Saw the PA yesterday and said x-rays looked good, needs to keep icing and keep coming to PT   Currently in Pain? Yes   Pain Score 3    Pain Location Knee   Pain Orientation Left   Pain Descriptors / Indicators Aching   Pain Type Surgical pain   Pain Onset More than a month ago   Pain Frequency Constant            [facility]  PT Assessment - 03/06/15 0001    Assessment   Medical Diagnosis Lt TKA   Referring Provider Dr Alvan Dame   Onset Date/Surgical Date 01/14/15   Hand Dominance Right   Next MD Visit 04/17/15   Observation/Other Assessments   Focus on Therapeutic Outcomes (FOTO)  47% limited   Functional Tests   Functional tests Single leg stance   Single Leg Stance   Comments 12 sec   AROM   Left Knee Extension -1   Left Knee Flexion 103   Strength   Right Hip Flexion 5/5   Right Hip Extension --  5-/5   Right Hip ADduction 5/5   Left Hip Flexion --  5-/5   Left Hip Extension --  5-/5   Left Hip ABduction --  5-/5                     [facility] Adult PT Treatment/Exercise - 03/06/15 0001    Knee/Hip Exercises: Aerobic   Stationary Bike L1x5'   Knee/Hip Exercises: Standing   SLS on compliant surface, then on blue therapadi, Lt LE   Knee/Hip Exercises: Supine   Quad Sets Left;20 reps  5 sec holds with foot propped on  pillow.    Knee/Hip Exercises: Prone   Hamstring Curl 3 sets;10 reps  3#   Modalities   Modalities Ambulance person Stimulation Parameters to tolerance   Electrical Stimulation Goals Edema;Pain   Vasopneumatic   Number Minutes Vasopneumatic  15 minutes   Vasopnuematic Location  Knee   Vasopneumatic Pressure Medium   Vasopneumatic Temperature  3*   Manual Therapy   Joint Mobilization patellar mobs superior/inferior.                  PT Education - 03/06/15 1605    Education provided Yes   Education Details patellar mobs   Person(s) Educated Patient   Methods Explanation;Demonstration   Comprehension Verbalized understanding             PT Long Term Goals - 03/06/15 1541    PT LONG TERM GOAL #1   Title I with advanced HEP ( 04/10/15)    Status On-going   PT LONG TERM GOAL #2   Title increase Lt knee  flexion =/> 105 degrees to assist with stairs ( 03/04/15)    PT LONG TERM GOAL #3   Title ambulate with heel toe pattern without an assistive device ( 04/10/15)    Time 4   Period Weeks   Status Partially Met  able to perform when she walks slow and concentrates.    PT LONG TERM GOAL #4   Title ambulate up/down steps to her condo with alternating gait and no difficulty ( 04/10/15)    Time 4   Period Weeks   Status On-going   PT LONG TERM GOAL #5   Title improve bilat hip strength =/> 5-/5 ( 03/04/15)    Status Achieved   Additional Long Term Goals   Additional Long Term Goals Yes   PT LONG TERM GOAL #6   Title report pain decrease =/> 50% with daily activity ( 03/04/15)    Status Achieved   PT LONG TERM GOAL #7   Title improve FOTO =/< 47% limited ( 03/04/15)    Status Achieved  scored 47% limited   PT LONG TERM GOAL #8   Title tolerate standing on the Lt  LE =/> 20 sec ( 04/10/15)    Time 4   Period Weeks   Status New               Plan - 03/06/15 1632    Clinical Impression Statement Lorita continues to show improvements in knee ROM, still has limitations on stairs and has gait abnormalities with walking.  Her hip strength is much better as is her knee strength.  She has more functional weakness now.  Would benefit from contiued PT    Rehab Potential Excellent   PT Frequency 1x / week   PT Duration 4 weeks   PT Treatment/Interventions Ultrasound;Neuromuscular re-education;Balance training;Scar mobilization;Patient/family education;DME Instruction;Passive range of motion;Gait training;Cryotherapy;Stair training;Dry needling;Electrical Stimulation;Taping;Moist Heat;Therapeutic exercise;Manual techniques   PT Next Visit Plan cont ROM, gait and strengthening   Consulted and Agree with Plan of Care Patient        Problem List Patient Active Problem List   Diagnosis Date Noted  . S/P left TKA 01/14/2015  . S/P knee replacement 01/14/2015  . Thoracic back pain 04/05/2014   . Beta thalassemia, heterozygous 03/11/2014  . Right lumbar radiculopathy 11/23/2013  . Arm numbness 07/16/2013  . Right flank pain 12/20/2012  .  Drowsiness 09/01/2012  . Ulcerative colitis, universal ([place]) 07/25/2012  . Routine general medical examination at a health care facility 05/19/2012  . Sebaceous cyst 05/19/2012  . THYROIDITIS 01/27/2010  . MIGRAINE HEADACHE 01/27/2010  . ALLERGIC RHINITIS 12/31/2009  . ASTHMA 12/31/2009  . PALPITATIONS 12/31/2009  . SHORTNESS OF BREATH 12/31/2009    Jeral Pinch PT 03/06/2015, 4:36 PM  [facility] [place], [place], [address] Phone: [phone]   Fax:  [phone]  Name: Nella Botsford MRN: [id number] Date of Birth: 1955/03/13

## 2015-03-06 NOTE — Patient Instructions (Signed)
Self mobilization of Lt patella

## 2015-03-13 ENCOUNTER — Ambulatory Visit (INDEPENDENT_AMBULATORY_CARE_PROVIDER_SITE_OTHER): Payer: BLUE CROSS/BLUE SHIELD | Admitting: Physical Therapy

## 2015-03-13 DIAGNOSIS — M7989 Other specified soft tissue disorders: Secondary | ICD-10-CM

## 2015-03-13 DIAGNOSIS — M79662 Pain in left lower leg: Secondary | ICD-10-CM

## 2015-03-13 DIAGNOSIS — M6289 Other specified disorders of muscle: Secondary | ICD-10-CM | POA: Diagnosis not present

## 2015-03-13 DIAGNOSIS — R269 Unspecified abnormalities of gait and mobility: Secondary | ICD-10-CM | POA: Diagnosis not present

## 2015-03-13 DIAGNOSIS — M25662 Stiffness of left knee, not elsewhere classified: Secondary | ICD-10-CM

## 2015-03-13 DIAGNOSIS — R29898 Other symptoms and signs involving the musculoskeletal system: Secondary | ICD-10-CM

## 2015-03-13 NOTE — Therapy (Signed)
[place], [place], [address] Phone: [phone]   Fax:  [phone]  Physical Therapy Treatment  Patient Details  Name: [name] MRN: [id number] Date of Birth: 12-07-1955 Referring Provider: Dr. Alvan Dame  Encounter Date: 03/13/2015      PT End of Session - 03/13/15 1539    Visit Number 10   Number of Visits 12   Date for PT Re-Evaluation 03/04/15   PT Start Time 1532   PT Stop Time 1620   PT Time Calculation (min) 48 min   Activity Tolerance Patient tolerated treatment well      Past Medical History  Diagnosis Date  . Migraine   . Arthritis   . UC (ulcerative colitis) ([place]) 06/2012    Colonoscopy with Dr. Hilarie Fredrickson  . Microcytic anemia   . GERD (gastroesophageal reflux disease)     Past Surgical History  Procedure Laterality Date  . Abdominal hysterectomy      partial  . Knee surgery  12-13,11-10    meniscal repairs  . Breast surgery    . Colonoscopy  20 + years ago    In [place].  . Total knee arthroplasty Left 01/14/2015    Procedure: TOTAL KNEE ARTHROPLASTY;  Surgeon: Paralee Cancel, MD;  Location: [facility];  Service: Orthopedics;  Laterality: Left;    There were no vitals filed for this visit.  Visit Diagnosis:  Stiffness of knee joint, left  Weakness of both hips  Pain and swelling of lower leg, left  Abnormality of gait      Subjective Assessment - 03/13/15 1540    Subjective Pt reports she returned to work 2 wks from home, "it's been rough".  Pt reports she has had more swelling in her Lt knee due to walking more.  Feels her walk is "more smooth"    Pertinent History Needs Rt TKA in the future, Lt knee scope ~ 7 yrs ago.  Rt side bulging disc dx ~ 3 months ago   Currently in Pain? Yes   Pain Score 2   took medicine prior to therapy   Pain Location Knee   Pain Orientation Left   Pain Descriptors / Indicators Dull;Aching   Aggravating Factors  walking,  standing (prolonged)    Pain Relieving Factors medication and ice.             [facility] PT Assessment - 03/13/15 0001    Assessment   Medical Diagnosis Lt TKA   Referring Provider Dr. Alvan Dame   Onset Date/Surgical Date 01/14/15   Hand Dominance Right   Next MD Visit 04/17/15   AROM   AROM Assessment Site Knee   Right/Left Knee Left   Left Knee Extension -2   Left Knee Flexion 108            [facility] Adult PT Treatment/Exercise - 03/13/15 0001    Exercises   Exercises Knee/Hip   Knee/Hip Exercises: Stretches   Passive Hamstring Stretch Right;Left;1 rep;30 seconds   Passive Hamstring Stretch Limitations some cramping in RLB   Gastroc Stretch 2 reps;30 seconds   Other Knee/Hip Stretches knee flexion sitting scooting forward 10-20 sec hold x 5-7    Knee/Hip Exercises: Aerobic   Stationary Bike L0: full revolutions x 8 min    Knee/Hip Exercises: Standing   Lateral Step Up Left;1 set;10 reps;Step Height: 6"   Step Down Right;1 set;15 reps;Hand Hold: 0  3" step    SLS Lt SLS on blue pad x  4 trials; Lt SLS forward leans to chair seat  x 10 reps    Knee/Hip Exercises: Supine   Heel Slides Left;5 reps;AAROM   Modalities   Modalities Pharmacologist Action ion repelling    Electrical Stimulation Parameters to tolerance    Electrical Stimulation Goals Edema;Pain   Vasopneumatic   Number Minutes Vasopneumatic  15 minutes   Vasopnuematic Location  Knee   Vasopneumatic Pressure Medium   Vasopneumatic Temperature  3 snowflakes                     PT Long Term Goals - 03/06/15 1541    PT LONG TERM GOAL #1   Title I with advanced HEP ( 04/10/15)    Status On-going   PT LONG TERM GOAL #2   Title increase Lt knee flexion =/> 105 degrees to assist with stairs ( 03/04/15)    PT LONG TERM GOAL #3   Title ambulate with heel toe pattern without an assistive device ( 04/10/15)    Time  4   Period Weeks   Status Partially Met  able to perform when she walks slow and concentrates.    PT LONG TERM GOAL #4   Title ambulate up/down steps to her condo with alternating gait and no difficulty ( 04/10/15)    Time 4   Period Weeks   Status On-going   PT LONG TERM GOAL #5   Title improve bilat hip strength =/> 5-/5 ( 03/04/15)    Status Achieved   Additional Long Term Goals   Additional Long Term Goals Yes   PT LONG TERM GOAL #6   Title report pain decrease =/> 50% with daily activity ( 03/04/15)    Status Achieved   PT LONG TERM GOAL #7   Title improve FOTO =/< 47% limited ( 03/04/15)    Status Achieved  scored 47% limited   PT LONG TERM GOAL #8   Title tolerate standing on the Lt  LE =/> 20 sec ( 04/10/15)    Time 4   Period Weeks   Status New               Plan - 03/13/15 1602    Clinical Impression Statement Pt demonstrated improved Lt knee flexion ROM this visit.  Pt had some LBP with exercises today, informed therapist after end of session.  Pt reported decrease in pain with use of estim and vaso at end of session.  Pt has met LTG #2 and is progressing towards goals.    Pt will benefit from skilled therapeutic intervention in order to improve on the following deficits Increased edema;Decreased strength;Decreased balance;Hypomobility;Impaired flexibility;Pain;Decreased range of motion;Abnormal gait   Rehab Potential Excellent   Clinical Impairments Affecting Rehab Potential Patient reports she was recently diagnosed with a lumbar disc issue, this may complicate her rehab if it flares up.    PT Frequency 1x / week   PT Duration 4 weeks   PT Treatment/Interventions Ultrasound;Neuromuscular re-education;Balance training;Scar mobilization;Patient/family education;DME Instruction;Passive range of motion;Gait training;Cryotherapy;Stair training;Dry needling;Electrical Stimulation;Taping;Moist Heat;Therapeutic exercise;Manual techniques   PT Next Visit Plan cont ROM, gait  and strengthening; progress HEP   Consulted and Agree with Plan of Care Patient        Problem List Patient Active Problem List   Diagnosis Date Noted  . S/P left TKA 01/14/2015  . S/P knee replacement 01/14/2015  . Thoracic back pain 04/05/2014  .  Beta thalassemia, heterozygous 03/11/2014  . Right lumbar radiculopathy 11/23/2013  . Arm numbness 07/16/2013  . Right flank pain 12/20/2012  . Drowsiness 09/01/2012  . Ulcerative colitis, universal ([place]) 07/25/2012  . Routine general medical examination at a health care facility 05/19/2012  . Sebaceous cyst 05/19/2012  . THYROIDITIS 01/27/2010  . MIGRAINE HEADACHE 01/27/2010  . ALLERGIC RHINITIS 12/31/2009  . ASTHMA 12/31/2009  . PALPITATIONS 12/31/2009  . SHORTNESS OF BREATH 12/31/2009    Kerin Perna, PTA 03/13/2015 4:59 PM  [place], [place], [address] Phone: [phone]   Fax:  [phone]  Name: [name] MRN: [id number] Date of Birth: 02-Aug-1955

## 2015-03-20 ENCOUNTER — Ambulatory Visit (INDEPENDENT_AMBULATORY_CARE_PROVIDER_SITE_OTHER): Payer: BLUE CROSS/BLUE SHIELD | Admitting: Physical Therapy

## 2015-03-20 DIAGNOSIS — M6289 Other specified disorders of muscle: Secondary | ICD-10-CM

## 2015-03-20 DIAGNOSIS — M7989 Other specified soft tissue disorders: Secondary | ICD-10-CM

## 2015-03-20 DIAGNOSIS — M79662 Pain in left lower leg: Secondary | ICD-10-CM | POA: Diagnosis not present

## 2015-03-20 DIAGNOSIS — R269 Unspecified abnormalities of gait and mobility: Secondary | ICD-10-CM

## 2015-03-20 DIAGNOSIS — M25662 Stiffness of left knee, not elsewhere classified: Secondary | ICD-10-CM | POA: Diagnosis not present

## 2015-03-20 DIAGNOSIS — R29898 Other symptoms and signs involving the musculoskeletal system: Secondary | ICD-10-CM

## 2015-03-20 NOTE — Therapy (Signed)
[place], [place], [address] Phone: [phone]   Fax:  [phone]  Physical Therapy Treatment  Patient Details  Name: [name] MRN: [id number] Date of Birth: 11-02-1955 Referring Provider: Dr. Alvan Dame  Encounter Date: 03/20/2015      PT End of Session - 03/20/15 1534    Visit Number 11   Number of Visits 12   PT Start Time 6269   PT Stop Time 1632   PT Time Calculation (min) 60 min      Past Medical History  Diagnosis Date  . Migraine   . Arthritis   . UC (ulcerative colitis) ([place]) 06/2012    Colonoscopy with Dr. Hilarie Fredrickson  . Microcytic anemia   . GERD (gastroesophageal reflux disease)     Past Surgical History  Procedure Laterality Date  . Abdominal hysterectomy      partial  . Knee surgery  12-13,11-10    meniscal repairs  . Breast surgery    . Colonoscopy  20 + years ago    In [place].  . Total knee arthroplasty Left 01/14/2015    Procedure: TOTAL KNEE ARTHROPLASTY;  Surgeon: Paralee Cancel, MD;  Location: [facility];  Service: Orthopedics;  Laterality: Left;    There were no vitals filed for this visit.  Visit Diagnosis:  Stiffness of knee joint, left  Weakness of both hips  Pain and swelling of lower leg, left  Abnormality of gait      Subjective Assessment - 03/20/15 1535    Subjective still hurts, no time to ice today. walked today. HEP compliant . hurts mostly evening.not been elvating above heart tonight while icing with ankle pumps. Swelling and warm to touch today when in clinic. swelling from ankle to knee.   Pain Score 4    Pain Location Knee   Pain Orientation Left   Pain Descriptors / Indicators Aching   Multiple Pain Sites No                         [facility] Adult PT Treatment/Exercise - 03/20/15 0001    Knee/Hip Exercises: Stretches   Active Hamstring Stretch 60 seconds  foot on stationary stool with spine upright fold from hip cr    Quad Stretch 60 seconds  prone with strap   Knee/Hip Exercises: Supine   Quad Sets 20 reps  L on pink noodle    Short Arc Quad Sets 20 reps  knee on sm orange ball   Bridges Limitations modified bridge 2X10 with no lifting iscometrics   Knee/Hip Exercises: Sidelying   Clams 6 X bilt   Theme park manager Action ion repel   Electrical Stimulation Parameters to tolerance   Electrical Stimulation Goals Edema   Vasopneumatic   Number Minutes Vasopneumatic  15 minutes   Vasopnuematic Location  Knee   Vasopneumatic Pressure Medium   Vasopneumatic Temperature  3 snowflows   Manual Therapy   Manual Therapy Soft tissue mobilization  retro massage to L LE to reduce swelling today.   Manual therapy comments with ice elvated above heart   Joint Mobilization patellar mob                     PT Long Term Goals - 03/06/15 1541    PT LONG TERM GOAL #1   Title I with advanced HEP ( 04/10/15)    Status On-going  PT LONG TERM GOAL #2   Title increase Lt knee flexion =/> 105 degrees to assist with stairs ( 03/04/15)    PT LONG TERM GOAL #3   Title ambulate with heel toe pattern without an assistive device ( 04/10/15)    Time 4   Period Weeks   Status Partially Met  able to perform when she walks slow and concentrates.    PT LONG TERM GOAL #4   Title ambulate up/down steps to her condo with alternating gait and no difficulty ( 04/10/15)    Time 4   Period Weeks   Status On-going   PT LONG TERM GOAL #5   Title improve bilat hip strength =/> 5-/5 ( 03/04/15)    Status Achieved   Additional Long Term Goals   Additional Long Term Goals Yes   PT LONG TERM GOAL #6   Title report pain decrease =/> 50% with daily activity ( 03/04/15)    Status Achieved   PT LONG TERM GOAL #7   Title improve FOTO =/< 47% limited ( 03/04/15)    Status Achieved  scored 47% limited   PT LONG TERM GOAL #8   Title tolerate standing on  the Lt  LE =/> 20 sec ( 04/10/15)    Time 4   Period Weeks   Status New               Plan - 03/20/15 1613    Clinical Impression Statement pt did well with exercise and new stretches for hams and quads today. she will include both at home and ice and elvate above heart.   PT Next Visit Plan cont ROM, gait and strengthening; progress HEP, check swelling next visit in response to HEP        Problem List Patient Active Problem List   Diagnosis Date Noted  . S/P left TKA 01/14/2015  . S/P knee replacement 01/14/2015  . Thoracic back pain 04/05/2014  . Beta thalassemia, heterozygous 03/11/2014  . Right lumbar radiculopathy 11/23/2013  . Arm numbness 07/16/2013  . Right flank pain 12/20/2012  . Drowsiness 09/01/2012  . Ulcerative colitis, universal ([place]) 07/25/2012  . Routine general medical examination at a health care facility 05/19/2012  . Sebaceous cyst 05/19/2012  . THYROIDITIS 01/27/2010  . MIGRAINE HEADACHE 01/27/2010  . ALLERGIC RHINITIS 12/31/2009  . ASTHMA 12/31/2009  . PALPITATIONS 12/31/2009  . SHORTNESS OF BREATH 12/31/2009     Natividad Brood, PTA  03/20/2015, 4:19 PM  [facility] [place], [place], [address] Phone: [phone]   Fax:  [phone]  Name: Zaylee Cornia MRN: [id number] Date of Birth: [birth year]

## 2015-03-20 NOTE — Patient Instructions (Signed)
Ham stretch with foot on stool for 60 seconds 3 X daily

## 2015-03-27 ENCOUNTER — Ambulatory Visit (INDEPENDENT_AMBULATORY_CARE_PROVIDER_SITE_OTHER): Payer: BLUE CROSS/BLUE SHIELD | Admitting: Physical Therapy

## 2015-03-27 ENCOUNTER — Encounter: Payer: Self-pay | Admitting: Physical Therapy

## 2015-03-27 DIAGNOSIS — R269 Unspecified abnormalities of gait and mobility: Secondary | ICD-10-CM

## 2015-03-27 DIAGNOSIS — M25662 Stiffness of left knee, not elsewhere classified: Secondary | ICD-10-CM

## 2015-03-27 DIAGNOSIS — M7989 Other specified soft tissue disorders: Secondary | ICD-10-CM

## 2015-03-27 DIAGNOSIS — M6289 Other specified disorders of muscle: Secondary | ICD-10-CM

## 2015-03-27 DIAGNOSIS — M79662 Pain in left lower leg: Secondary | ICD-10-CM | POA: Diagnosis not present

## 2015-03-27 DIAGNOSIS — R29898 Other symptoms and signs involving the musculoskeletal system: Secondary | ICD-10-CM

## 2015-03-27 NOTE — Therapy (Signed)
[place], [place], [address] Phone: [phone]   Fax:  [phone]  Physical Therapy Treatment  Patient Details  Name: [name] MRN: [id number] Date of Birth: 1955-11-20 Referring Provider: Dr. Alvan Dame  Encounter Date: 03/27/2015      PT End of Session - 03/27/15 1534    Visit Number 12   Number of Visits 12   Date for PT Re-Evaluation 03/04/15   PT Start Time 1527   PT Stop Time 1626   PT Time Calculation (min) 59 min      Past Medical History  Diagnosis Date  . Migraine   . Arthritis   . UC (ulcerative colitis) ([place]) 06/2012    Colonoscopy with Dr. Hilarie Fredrickson  . Microcytic anemia   . GERD (gastroesophageal reflux disease)     Past Surgical History  Procedure Laterality Date  . Abdominal hysterectomy      partial  . Knee surgery  12-13,11-10    meniscal repairs  . Breast surgery    . Colonoscopy  20 + years ago    In [place].  . Total knee arthroplasty Left 01/14/2015    Procedure: TOTAL KNEE ARTHROPLASTY;  Surgeon: Paralee Cancel, MD;  Location: [facility];  Service: Orthopedics;  Laterality: Left;    There were no vitals filed for this visit.  Visit Diagnosis:  Stiffness of knee joint, left  Weakness of both hips  Pain and swelling of lower leg, left  Abnormality of gait      Subjective Assessment - 03/27/15 1548    Subjective Pt states she is back at work and doing ok, still has pain at night that interfers with sleeping ( rec she try moist heat to help with this)    Currently in Pain? Yes   Pain Score 4    Pain Location Knee   Pain Orientation Left   Pain Descriptors / Indicators Aching   Pain Type Surgical pain   Aggravating Factors  able to walk through stores now however pain is worse by the time she gets home.    Pain Relieving Factors medication PRN, ice            [facility] PT Assessment - 03/27/15 0001    Assessment   Medical Diagnosis Lt TKA   AROM    AROM Assessment Site Knee   Right/Left Knee Left   Left Knee Flexion 108                     [facility] Adult PT Treatment/Exercise - 03/27/15 0001    Ambulation/Gait   Ambulation/Gait --  ambulate up/down stairs in hallway, alternating   Ambulation Distance (Feet) --  rail assist, fair eccentric control with Lt quad.    Knee/Hip Exercises: Stretches   Gastroc Stretch Both;30 seconds   Knee/Hip Exercises: Aerobic   Stationary Bike L1x6'   Knee/Hip Exercises: Standing   Lateral Step Up Step Height: 8";Left  40   Forward Step Up Step Height: 8";Left  40 reps   Knee/Hip Exercises: Supine   Patellar Mobs knee presses with leg raised into HS stretch. using strap   Knee/Hip Exercises: Prone   Hamstring Curl 3 sets;10 reps  3#   Prone Knee Hang 2 minutes   Modalities   Modalities Electrical Stimulation;Vasopneumatic   Theme park manager Action ion repelling   Electrical Stimulation Parameters to toleracne   Electrical Stimulation Goals Edema  Vasopneumatic   Number Minutes Vasopneumatic  15 minutes   Vasopnuematic Location  Knee   Vasopneumatic Pressure Medium   Vasopneumatic Temperature  3*                     PT Long Term Goals - 03/27/15 1601    PT LONG TERM GOAL #1   Title I with advanced HEP ( 04/10/15)    Status Achieved   PT LONG TERM GOAL #2   Title increase Lt knee flexion =/> 105 degrees to assist with stairs ( 03/04/15)    Status Achieved   PT LONG TERM GOAL #3   Title ambulate with heel toe pattern without an assistive device ( 04/10/15)    Status Partially Met   PT LONG TERM GOAL #4   Title ambulate up/down steps to her condo with alternating gait and no difficulty ( 04/10/15)    Status On-going  some difficulty with eccentric Lt quad control   PT LONG TERM GOAL #5   Title improve bilat hip strength =/> 5-/5 ( 03/04/15)    Status Achieved   PT LONG TERM GOAL #6    Title report pain decrease =/> 50% with daily activity ( 03/04/15)    Status Achieved   PT LONG TERM GOAL #7   Title improve FOTO =/< 47% limited ( 03/04/15)    Status Achieved               Plan - 03/27/15 1757    Clinical Impression Statement [name] is making progress, she met another goal and is progressing ot the others. She is doing her HEP and fatiguing with returning to work.     Pt will benefit from skilled therapeutic intervention in order to improve on the following deficits Increased edema;Decreased strength;Decreased balance;Hypomobility;Impaired flexibility;Pain;Decreased range of motion;Abnormal gait   Rehab Potential Excellent   PT Treatment/Interventions Ultrasound;Neuromuscular re-education;Balance training;Scar mobilization;Patient/family education;DME Instruction;Passive range of motion;Gait training;Cryotherapy;Stair training;Dry needling;Electrical Stimulation;Taping;Moist Heat;Therapeutic exercise;Manual techniques   PT Next Visit Plan reassess.    Consulted and Agree with Plan of Care Patient        Problem List Patient Active Problem List   Diagnosis Date Noted  . S/P left TKA 01/14/2015  . S/P knee replacement 01/14/2015  . Thoracic back pain 04/05/2014  . Beta thalassemia, heterozygous 03/11/2014  . Right lumbar radiculopathy 11/23/2013  . Arm numbness 07/16/2013  . Right flank pain 12/20/2012  . Drowsiness 09/01/2012  . Ulcerative colitis, universal ([place]) 07/25/2012  . Routine general medical examination at a health care facility 05/19/2012  . Sebaceous cyst 05/19/2012  . THYROIDITIS 01/27/2010  . MIGRAINE HEADACHE 01/27/2010  . ALLERGIC RHINITIS 12/31/2009  . ASTHMA 12/31/2009  . PALPITATIONS 12/31/2009  . SHORTNESS OF BREATH 12/31/2009    Jeral Pinch PT 03/27/2015, 5:59 PM  [facility] [place], [place], [address] Phone: [phone]   Fax:  [phone]  Name:  [name] MRN: [id number] Date of Birth: 1955/12/21

## 2015-03-28 ENCOUNTER — Telehealth: Payer: Self-pay | Admitting: Family Medicine

## 2015-03-28 NOTE — Telephone Encounter (Signed)
Chart updated to reflect 

## 2015-03-28 NOTE — Telephone Encounter (Signed)
Pt declined stating she doesn't take the flu shot.

## 2015-04-10 ENCOUNTER — Encounter: Payer: BLUE CROSS/BLUE SHIELD | Admitting: Physical Therapy

## 2015-04-24 ENCOUNTER — Ambulatory Visit (INDEPENDENT_AMBULATORY_CARE_PROVIDER_SITE_OTHER): Payer: BLUE CROSS/BLUE SHIELD | Admitting: Physical Therapy

## 2015-04-24 DIAGNOSIS — R269 Unspecified abnormalities of gait and mobility: Secondary | ICD-10-CM | POA: Diagnosis not present

## 2015-04-24 DIAGNOSIS — M6289 Other specified disorders of muscle: Secondary | ICD-10-CM | POA: Diagnosis not present

## 2015-04-24 DIAGNOSIS — M7989 Other specified soft tissue disorders: Secondary | ICD-10-CM

## 2015-04-24 DIAGNOSIS — M25662 Stiffness of left knee, not elsewhere classified: Secondary | ICD-10-CM | POA: Diagnosis not present

## 2015-04-24 DIAGNOSIS — M79662 Pain in left lower leg: Secondary | ICD-10-CM | POA: Diagnosis not present

## 2015-04-24 DIAGNOSIS — R29898 Other symptoms and signs involving the musculoskeletal system: Secondary | ICD-10-CM

## 2015-04-24 NOTE — Patient Instructions (Signed)
Knee Extension: Resisted (Sitting)    With band looped around right ankle and under other foot, straighten leg with ankle loop. Keep other leg bent to increase resistance. Repeat _10___ times per set. Do __2-3__ sets per session. Do _1___ sessions per day. 3x/wk  http://orth.exer.us/691   Copyright  VHI. All rights reserved.  Hamstring Curl: Resisted (Sitting)    Facing anchor with tubing on right ankle, leg straight out, bend knee. Repeat _10___ times per set. Do _2-3___ sets per session. Do __1__ sessions per day.   http://orth.exer.us/669  Anterior Step-Down    Stand with both feet on _6__ inch step. Step down in A direction with Right foot, touching heel to the floor and return (backwards with Left) _10__ times.  __1-2_ sets __1_ times per day.  http://gglj.exer.us/185   [facility] [place] [address] [place], [place] [address]  [phone] (office) [phone] (fax)

## 2015-04-24 NOTE — Therapy (Addendum)
[place], [place], [address] Phone: [phone]   Fax:  [phone]  Physical Therapy Treatment  Patient Details  Name: [name] MRN: [id number] Date of Birth: 1955-08-13 Referring Provider: Dr. Alvan Dame  Encounter Date: 04/24/2015      PT End of Session - 04/24/15 1544    Visit Number 13   PT Start Time 4944   PT Stop Time 9675   PT Time Calculation (min) 44 min      Past Medical History  Diagnosis Date  . Migraine   . Arthritis   . UC (ulcerative colitis) ([place]) 06/2012    Colonoscopy with Dr. Hilarie Fredrickson  . Microcytic anemia   . GERD (gastroesophageal reflux disease)     Past Surgical History  Procedure Laterality Date  . Abdominal hysterectomy      partial  . Knee surgery  12-13,11-10    meniscal repairs  . Breast surgery    . Colonoscopy  20 + years ago    In [place].  . Total knee arthroplasty Left 01/14/2015    Procedure: TOTAL KNEE ARTHROPLASTY;  Surgeon: Paralee Cancel, MD;  Location: [facility];  Service: Orthopedics;  Laterality: Left;    There were no vitals filed for this visit.  Visit Diagnosis:  Stiffness of knee joint, left  Weakness of both hips  Pain and swelling of lower leg, left  Abnormality of gait      Subjective Assessment - 04/24/15 1546    Subjective Pt reports stairs are getting easier.  Pt states she still has swelling at times and "a zing" in knee.  Difficulty sleeping at night.  MHP/ice and pain medicine as needed for pain.     Currently in Pain? Yes   Pain Score 1    Pain Location Knee   Pain Orientation Left   Pain Descriptors / Indicators Aching   Aggravating Factors  bending knee more than comfortable, stairs   Pain Relieving Factors ice, medication, rest.             [facility] PT Assessment - 04/24/15 0001    Assessment   Medical Diagnosis Lt TKA   Referring Provider Dr. Alvan Dame   Onset Date/Surgical Date 01/14/15   Hand Dominance  Right   Next MD Visit 11/2015   Observation/Other Assessments   Focus on Therapeutic Outcomes (FOTO)  47% limited   AROM   AROM Assessment Site Knee   Right/Left Knee Left   Left Knee Extension -1   Left Knee Flexion 115         [facility] Adult PT Treatment/Exercise - 04/24/15 0001    Knee/Hip Exercises: Aerobic   Stationary Bike L1 x 5 min   Knee/Hip Exercises: Standing   Step Down 1 set;10 reps;Hand Hold: 2;Step Height: 6";Right  challenging    Knee/Hip Exercises: Seated   Long Arc Quad Strengthening;Left;1 set;10 reps  green band    Other Seated Knee/Hip Exercises seated scoots to increased Lt knee x 10 reps x 10 sec    Hamstring Curl Left;1 set;10 reps  with green band    Knee/Hip Exercises: Supine   Quad Sets Left;1 set;10 reps  in long sitting   Heel Slides 5 reps  for measurement   Modalities   Modalities --  pt declined, will do at home.    Manual Therapy   Manual Therapy Soft tissue mobilization;Taping   Joint Mobilization patellar mob   Soft tissue mobilization Edge tool assistance to Lt quad  and ant knee to decrease adhesions, pain and increase ROM.     Kinesiotex Edema   Kinesiotix   Edema Web pattern with Rock tape placed on anterior surface of Lt knee           PT Education - 04/24/15 1708    Education provided Yes   Education Details Pt encouraged to continue desenstizing Lt knee and massage incision to decrease adhesions. Pt was instructed about Rock tape use and gente removal.   HEP and green band issued.    Person(s) Educated Patient   Methods Explanation;Handout   Comprehension Verbalized understanding;Returned demonstration             PT Long Term Goals - 04/24/15 1704    PT LONG TERM GOAL #1   Title I with advanced HEP ( 04/10/15)    Time 4   Period Weeks   Status Achieved   PT LONG TERM GOAL #2   Title increase Lt knee flexion =/> 105 degrees to assist with stairs ( 03/04/15)    Time 4   Period Weeks   Status Achieved   PT LONG  TERM GOAL #3   Title ambulate with heel toe pattern without an assistive device ( 04/10/15)    Time 4   Period Weeks   Status Achieved   PT LONG TERM GOAL #4   Title ambulate up/down steps to her condo with alternating gait and no difficulty ( 04/10/15)    Time 4   Period Weeks   Status Achieved   PT LONG TERM GOAL #5   Title improve bilat hip strength =/> 5-/5 ( 03/04/15)    Time 4   Period Weeks   Status Achieved   PT LONG TERM GOAL #6   Title report pain decrease =/> 50% with daily activity ( 03/04/15)    Time 4   Period Weeks   Status Achieved   PT LONG TERM GOAL #7   Title improve FOTO =/< 47% limited ( 03/04/15)    Time 4   Period Weeks   Status Achieved   PT LONG TERM GOAL #8   Title tolerate standing on the Lt  LE =/> 20 sec ( 04/10/15)    Time 4   Period Weeks   Status Unable to assess               Plan - 04/24/15 1706    Clinical Impression Statement Pt has partially met her goals but she states she is satisfied with current level of function and requests to d/c to HEP at this time.    Pt will benefit from skilled therapeutic intervention in order to improve on the following deficits Increased edema;Decreased strength;Decreased balance;Hypomobility;Impaired flexibility;Pain;Decreased range of motion;Abnormal gait   Rehab Potential Excellent   PT Frequency 1x / week   PT Duration 4 weeks   PT Treatment/Interventions Ultrasound;Neuromuscular re-education;Balance training;Scar mobilization;Patient/family education;DME Instruction;Passive range of motion;Gait training;Cryotherapy;Stair training;Dry needling;Electrical Stimulation;Taping;Moist Heat;Therapeutic exercise;Manual techniques   PT Next Visit Plan Spoke with supervising PT; will d/c to HEP.    Consulted and Agree with Plan of Care Patient        Problem List Patient Active Problem List   Diagnosis Date Noted  . S/P left TKA 01/14/2015  . S/P knee replacement 01/14/2015  . Thoracic back pain  04/05/2014  . Beta thalassemia, heterozygous 03/11/2014  . Right lumbar radiculopathy 11/23/2013  . Arm numbness 07/16/2013  . Right flank pain 12/20/2012  . Drowsiness 09/01/2012  . Ulcerative  colitis, universal ([place]) 07/25/2012  . Routine general medical examination at a health care facility 05/19/2012  . Sebaceous cyst 05/19/2012  . THYROIDITIS 01/27/2010  . MIGRAINE HEADACHE 01/27/2010  . ALLERGIC RHINITIS 12/31/2009  . ASTHMA 12/31/2009  . PALPITATIONS 12/31/2009  . SHORTNESS OF BREATH 12/31/2009   Kerin Perna, PTA 04/24/2015 5:10 PM  [facility] [place], [place], [address] Phone: [phone]   Fax:  [phone]  Name: [name] MRN: [id number] Date of Birth: Feb 09, 1956    PHYSICAL THERAPY DISCHARGE SUMMARY  Visits from Start of Care: 13  Current functional level related to goals / functional outcomes: See above   Remaining deficits: none   Education / Equipment: HEP Plan: Patient agrees to discharge.  Patient goals were met. Patient is being discharged due to meeting the stated rehab goals.  ?????   Jeral Pinch, PT 05/05/2015 8:19 AM

## 2015-05-09 ENCOUNTER — Encounter: Payer: Self-pay | Admitting: Family Medicine

## 2015-05-09 ENCOUNTER — Ambulatory Visit (INDEPENDENT_AMBULATORY_CARE_PROVIDER_SITE_OTHER): Payer: BLUE CROSS/BLUE SHIELD | Admitting: Family Medicine

## 2015-05-09 VITALS — BP 122/83 | HR 90 | Temp 98.0°F | Resp 16 | Ht 62.0 in | Wt 208.2 lb

## 2015-05-09 DIAGNOSIS — Z0000 Encounter for general adult medical examination without abnormal findings: Secondary | ICD-10-CM | POA: Diagnosis not present

## 2015-05-09 LAB — CBC WITH DIFFERENTIAL/PLATELET
Basophils Absolute: 0 10*3/uL (ref 0.0–0.1)
Basophils Relative: 0 % (ref 0–1)
EOS ABS: 0.3 10*3/uL (ref 0.0–0.7)
EOS PCT: 4 % (ref 0–5)
HEMATOCRIT: 41.2 % (ref 36.0–46.0)
Hemoglobin: 13.5 g/dL (ref 12.0–15.0)
LYMPHS PCT: 35 % (ref 12–46)
Lymphs Abs: 2.8 10*3/uL (ref 0.7–4.0)
MCH: 22.2 pg — AB (ref 26.0–34.0)
MCHC: 32.8 g/dL (ref 30.0–36.0)
MCV: 67.9 fL — AB (ref 78.0–100.0)
MONOS PCT: 3 % (ref 3–12)
MPV: 8.8 fL (ref 8.6–12.4)
Monocytes Absolute: 0.2 10*3/uL (ref 0.1–1.0)
Neutro Abs: 4.7 10*3/uL (ref 1.7–7.7)
Neutrophils Relative %: 58 % (ref 43–77)
Platelets: 294 10*3/uL (ref 150–400)
RBC: 6.07 MIL/uL — ABNORMAL HIGH (ref 3.87–5.11)
RDW: 15.8 % — AB (ref 11.5–15.5)
WBC: 8.1 10*3/uL (ref 4.0–10.5)

## 2015-05-09 LAB — TSH: TSH: 0.87 mIU/L

## 2015-05-09 MED ORDER — RIZATRIPTAN BENZOATE 10 MG PO TABS: 10.0000 mg | ORAL_TABLET | ORAL | Status: DC | PRN

## 2015-05-09 NOTE — Progress Notes (Signed)
   Subjective:    Patient ID: [name], female    DOB: 1955-04-12, 60 y.o.   MRN: [id number]  HPI CPE- UTD on mammo (due Sept), colonoscopy.  No need for paps due to hysterectomy.     Review of Systems Patient reports no vision/ hearing changes, adenopathy,fever, weight change,  persistant/recurrent hoarseness , swallowing issues, chest pain, palpitations, edema, persistant/recurrent cough, hemoptysis, dyspnea (rest/exertional/paroxysmal nocturnal), gastrointestinal bleeding (melena, rectal bleeding), abdominal pain, significant heartburn, bowel changes, GU symptoms (dysuria, hematuria, incontinence), Gyn symptoms (abnormal  bleeding, pain),  syncope, focal weakness, memory loss, numbness & tingling, skin/hair/nail changes, abnormal bruising or bleeding, anxiety, or depression.     Objective:   Physical Exam General Appearance:    Alert, cooperative, no distress, appears stated age  Head:    Normocephalic, without obvious abnormality, atraumatic  Eyes:    PERRL, conjunctiva/corneas clear, EOM's intact, fundi    benign, both eyes  Ears:    Normal TM's and external ear canals, both ears  Nose:   Nares normal, septum midline, mucosa normal, no drainage    or sinus tenderness  Throat:   Lips, mucosa, and tongue normal; teeth and gums normal  Neck:   Supple, symmetrical, trachea midline, no adenopathy;    Thyroid: no enlargement/tenderness/nodules  Back:     Symmetric, no curvature, ROM normal, no CVA tenderness  Lungs:     Clear to auscultation bilaterally, respirations unlabored  Chest Wall:    No tenderness or deformity   Heart:    Regular rate and rhythm, S1 and S2 normal, no murmur, rub   or gallop  Breast Exam:    Deferred to mammo  Abdomen:     Soft, non-tender, bowel sounds active all four quadrants,    no masses, no organomegaly  Genitalia:    Deferred  Rectal:    Extremities:   Extremities normal, atraumatic, no cyanosis or edema  Pulses:   2+ and symmetric all  extremities  Skin:   Skin color, texture, turgor normal, no rashes or lesions  Lymph nodes:   Cervical, supraclavicular, and axillary nodes normal  Neurologic:   CNII-XII intact, normal strength, sensation and reflexes    throughout          Assessment & Plan:

## 2015-05-09 NOTE — Progress Notes (Signed)
Pre visit review using our clinic review tool, if applicable. No additional management support is needed unless otherwise documented below in the visit note. 

## 2015-05-09 NOTE — Patient Instructions (Signed)
Follow up in 1 year or as needed We'll notify you of your lab results and make any changes if needed Continue to work on healthy diet and regular exercise- you can do it! When you get your reminder for the mammogram call and schedule Call with any questions or concerns Thanks for sticking with [place]!!! Happy Spring!!!

## 2015-05-10 LAB — LIPID PANEL
Cholesterol: 170 mg/dL (ref 125–200)
HDL: 68 mg/dL (ref >=46)
LDL Cholesterol: 92 mg/dL (ref <130)
Total CHOL/HDL Ratio: 2.5 Ratio (ref <=5.0)
Triglycerides: 49 mg/dL (ref <150)
VLDL: 10 mg/dL (ref <30)

## 2015-05-10 LAB — HEPATIC FUNCTION PANEL
ALK PHOS: 72 U/L (ref 33–130)
ALT: 16 U/L (ref 6–29)
AST: 18 U/L (ref 10–35)
Albumin: 4.1 g/dL (ref 3.6–5.1)
BILIRUBIN INDIRECT: 0.5 mg/dL (ref 0.2–1.2)
Bilirubin, Direct: 0.1 mg/dL (ref <=0.2)
TOTAL PROTEIN: 7.6 g/dL (ref 6.1–8.1)
Total Bilirubin: 0.6 mg/dL (ref 0.2–1.2)

## 2015-05-10 LAB — VITAMIN D 25 HYDROXY (VIT D DEFICIENCY, FRACTURES): Vit D, 25-Hydroxy: 20 ng/mL — ABNORMAL LOW (ref 30–100)

## 2015-05-10 LAB — BASIC METABOLIC PANEL
BUN: 11 mg/dL (ref 7–25)
CALCIUM: 9.8 mg/dL (ref 8.6–10.4)
CHLORIDE: 103 mmol/L (ref 98–110)
CO2: 26 mmol/L (ref 20–31)
Creat: 0.63 mg/dL (ref 0.50–1.05)
GLUCOSE: 81 mg/dL (ref 65–99)
POTASSIUM: 4.7 mmol/L (ref 3.5–5.3)
SODIUM: 144 mmol/L (ref 135–146)

## 2015-05-11 NOTE — Assessment & Plan Note (Signed)
Pt's PE WNL.  UTD on colonoscopy, mammo.  No need for pap due to hysterectomy.  Check labs.  Anticipatory guidance provided.

## 2015-05-12 ENCOUNTER — Other Ambulatory Visit: Payer: Self-pay | Admitting: General Practice

## 2015-05-12 MED ORDER — VITAMIN D (ERGOCALCIFEROL) 1.25 MG (50000 UNIT) PO CAPS: 50000.0000 [IU] | ORAL_CAPSULE | ORAL | Status: DC

## 2015-10-26 ENCOUNTER — Other Ambulatory Visit: Payer: Self-pay | Admitting: Internal Medicine

## 2015-10-26 DIAGNOSIS — K51918 Ulcerative colitis, unspecified with other complication: Secondary | ICD-10-CM

## 2015-10-27 NOTE — Telephone Encounter (Signed)
Pt needs to schedule a one year follow up for any additional refills.

## 2015-12-02 ENCOUNTER — Other Ambulatory Visit: Payer: Self-pay | Admitting: Family Medicine

## 2015-12-02 DIAGNOSIS — Z1231 Encounter for screening mammogram for malignant neoplasm of breast: Secondary | ICD-10-CM

## 2015-12-05 ENCOUNTER — Encounter: Payer: Self-pay | Admitting: Family Medicine

## 2015-12-05 ENCOUNTER — Ambulatory Visit (INDEPENDENT_AMBULATORY_CARE_PROVIDER_SITE_OTHER): Payer: BLUE CROSS/BLUE SHIELD | Admitting: Family Medicine

## 2015-12-05 VITALS — BP 122/68 | HR 82 | Temp 98.6°F | Resp 17 | Ht 62.0 in | Wt 218.0 lb

## 2015-12-05 DIAGNOSIS — M79601 Pain in right arm: Secondary | ICD-10-CM

## 2015-12-05 NOTE — Progress Notes (Signed)
   Subjective:    Patient ID: [name], female    DOB: 02/06/1956, 60 y.o.   MRN: [id number]  HPI R arm pain- pt reports R leg went numb on Saturday and she fell, catching herself w/ the R arm on concrete.  Pt fell somewhat sideways and backwards.  Pt is having pain from wrist up to elbow and extending into upper arm.  Is not able to rotate wrist or extend her elbow.  Pt has been icing and wrapping arm this week.   Review of Systems For ROS see HPI     Objective:   Physical Exam  Constitutional: She is oriented to person, place, and time. She appears well-developed and well-nourished. No distress.  HENT:  Head: Normocephalic and atraumatic.  Cardiovascular: Intact distal pulses.   Musculoskeletal: She exhibits tenderness (very tender over R wrist and elbow). She exhibits no edema.  Pain w/ R wrist extension and ulnar deviation, no pain w/ flexion or radial deviation.  Pain w/ supination and pronation.  Pt is physically unable to extend R elbow, pain w/ flexion past 120  Neurological: She is alert and oriented to person, place, and time. No cranial nerve deficit. Coordination normal.  Skin: Skin is warm and dry. No erythema.  Psychiatric: She has a normal mood and affect. Her behavior is normal. Thought content normal.  Vitals reviewed.         Assessment & Plan:  R arm pain- pt had a fall on her R arm 6 days ago and now has pain and limited elbow extension.  Concern for fx or dislocation.  Since it is Friday afternoon, will see if her usual ortho office ([facility]) can see her.  If not, will need to go to Ortho UC at [facility].  Pt expressed understanding and is in agreement w/ plan.

## 2015-12-05 NOTE — Patient Instructions (Signed)
Follow up as needed If [facility] cannot see you this afternoon, please go to [facility] for the after hours orthopedics.  They start seeing patients at 5:30 but you can be there at 5 to check in. Call with any questions or concerns Hang in there!!!

## 2015-12-05 NOTE — Progress Notes (Signed)
Pre visit review using our clinic review tool, if applicable. No additional management support is needed unless otherwise documented below in the visit note. 

## 2015-12-18 ENCOUNTER — Ambulatory Visit
Admission: RE | Admit: 2015-12-18 | Discharge: 2015-12-18 | Disposition: A | Discharge status: Home / Self Care | Payer: BLUE CROSS/BLUE SHIELD | Source: Ambulatory Visit | Attending: Family Medicine | Admitting: Family Medicine

## 2015-12-18 DIAGNOSIS — Z1231 Encounter for screening mammogram for malignant neoplasm of breast: Secondary | ICD-10-CM

## 2015-12-26 ENCOUNTER — Ambulatory Visit: Payer: Self-pay | Admitting: Physician Assistant

## 2015-12-26 ENCOUNTER — Ambulatory Visit: Payer: BLUE CROSS/BLUE SHIELD | Admitting: Family Medicine

## 2015-12-30 ENCOUNTER — Encounter: Payer: Self-pay | Admitting: Physician Assistant

## 2015-12-30 ENCOUNTER — Ambulatory Visit (INDEPENDENT_AMBULATORY_CARE_PROVIDER_SITE_OTHER): Payer: BLUE CROSS/BLUE SHIELD | Admitting: Physician Assistant

## 2015-12-30 VITALS — BP 110/80 | HR 80 | Temp 98.3°F | Resp 16 | Ht 62.0 in | Wt 217.0 lb

## 2015-12-30 DIAGNOSIS — Z01818 Encounter for other preprocedural examination: Secondary | ICD-10-CM | POA: Diagnosis not present

## 2015-12-30 NOTE — Patient Instructions (Addendum)
Your EKG looks good today. It is unchanged from prior EKGs.  Your examination today is unremarkable and vitals are stable.  We are clearing you medically for surgical procedure. We will fax the clearance to Dr. Rolena Infante.

## 2015-12-30 NOTE — Progress Notes (Signed)
Patient presents to clinic today for medical preoperative clearance. Patient is scheduled for a lumbar fusion in December with Dr. Rolena Infante. Patient without history of hypertension or diabetes mellitus. Denies history of CVA, MI, COPD, Asthma, DVT or PE. Patient denies chest pain, palpitations, lightheadedness, dizziness, vision changes or frequent headaches. Denies any shortness of breath. Patient endorses prior surgical procedures with general anesthesia without adverse reactions. Denies history of renal disease.   Lab Results  Component Value Date   CREATININE 0.63 05/09/2015   Past Medical History:  Diagnosis Date  . Arthritis   . GERD (gastroesophageal reflux disease)   . Microcytic anemia   . Migraine   . UC (ulcerative colitis) ([place]) 06/2012   Colonoscopy with Dr. Hilarie Fredrickson    Current Outpatient Prescriptions on File Prior to Visit  Medication Sig Dispense Refill  . acetaminophen (TYLENOL) 500 MG tablet Take 2 tablets (1,000 mg total) by mouth every 8 (eight) hours. 30 tablet 0  . dextromethorphan-guaiFENesin (MUCINEX DM) 30-600 MG 12hr tablet Take 1 tablet by mouth 2 (two) times daily as needed.     . ferrous sulfate 325 (65 FE) MG tablet Take 1 tablet (325 mg total) by mouth 3 (three) times daily after meals.  3  . mesalamine (LIALDA) 1.2 g EC tablet TAKE 2 TABLETS DAILY 60 tablet 0  . oxyCODONE (OXY IR/ROXICODONE) 5 MG immediate release tablet Take 1-3 tablets (5-15 mg total) by mouth every 4 (four) hours as needed for severe pain. 120 tablet 0  . rizatriptan (MAXALT) 10 MG tablet Take 1 tablet (10 mg total) by mouth as needed for migraine. May repeat in 2 hours if needed 10 tablet 6   No current facility-administered medications on file prior to visit.     No Known Allergies  Family History  Problem Relation Age of Onset  . Sickle cell trait Mother   . Diabetes Brother   . Diabetes Daughter   . Asthma    . Colon cancer Neg Hx     Social History   Social History  .  Marital status: Divorced    Spouse name: N/A  . Number of children: 3  . Years of education: college   Occupational History  .  [employer]    Computers   Social History Main Topics  . Smoking status: Never Smoker  . Smokeless tobacco: Never Used  . Alcohol use No  . Drug use: No  . Sexual activity: Not Asked   Other Topics Concern  . None   Social History Narrative   Patient lives at home alone and she is divorced. Patient works with computers. Patient has a college education.   Right handed.   Caffeine.- one cup daily.      Review of Systems - See HPI.  All other ROS are negative.  BP 110/80   Pulse 80   Temp 98.3 F (36.8 C) (Oral)   Resp 16   Ht 5' 2"  (1.575 m)   Wt 217 lb (98.4 kg)   SpO2 98%   BMI 39.69 kg/m   Physical Exam  Constitutional: She is oriented to person, place, and time and well-developed, well-nourished, and in no distress.  HENT:  Head: Normocephalic and atraumatic.  Eyes: Conjunctivae are normal.  Neck: Neck supple.  Cardiovascular: Normal rate, regular rhythm, normal heart sounds and intact distal pulses.   Pulmonary/Chest: Effort normal and breath sounds normal. No respiratory distress. She has no wheezes. She has no rales. She exhibits no  tenderness.  Neurological: She is alert and oriented to person, place, and time.  Skin: Skin is warm and dry. No rash noted.  Psychiatric: Affect normal.  Vitals reviewed.  Assessment/Plan: 1. Preoperative clearance EKG obtained revealing NSR. Unchanged from prior EKG. No cardiopulmonary history. Vitals stable. Exam within normal limits. Discussed labs with PCP/supervising MD  [place] who does not feel necessary for clearance giving unremarkable history. Will defer any preop labs to anesthesiologist or performing surgeon. Is not on any antiplatelets or anticoagulants. Clearance given. - EKG 12-Lead   Leeanne Rio, PA-C

## 2015-12-30 NOTE — Progress Notes (Signed)
Pre visit review using our clinic review tool, if applicable. No additional management support is needed unless otherwise documented below in the visit note. 

## 2016-01-15 ENCOUNTER — Ambulatory Visit (HOSPITAL_COMMUNITY)
Admission: RE | Admit: 2016-01-15 | Discharge: 2016-01-15 | Disposition: A | Discharge status: Home / Self Care | Payer: BLUE CROSS/BLUE SHIELD | Source: Ambulatory Visit | Attending: Cardiology | Admitting: Cardiology

## 2016-01-15 ENCOUNTER — Encounter (HOSPITAL_COMMUNITY): Payer: Self-pay

## 2016-01-15 ENCOUNTER — Other Ambulatory Visit (HOSPITAL_COMMUNITY): Payer: Self-pay | Admitting: Orthopedic Surgery

## 2016-01-15 DIAGNOSIS — M79605 Pain in left leg: Secondary | ICD-10-CM

## 2016-01-15 NOTE — Progress Notes (Signed)
Left lower extremity venous duplex is negative for DVT. Preliminary results given to Dr. Alvan Dame via Marlowe Kays.

## 2016-01-20 ENCOUNTER — Encounter (HOSPITAL_COMMUNITY)
Admission: RE | Admit: 2016-01-20 | Discharge: 2016-01-20 | Disposition: A | Discharge status: Home / Self Care | Payer: BLUE CROSS/BLUE SHIELD | Source: Ambulatory Visit | Attending: Orthopedic Surgery | Admitting: Orthopedic Surgery

## 2016-01-20 ENCOUNTER — Encounter (HOSPITAL_COMMUNITY): Payer: Self-pay

## 2016-01-20 DIAGNOSIS — Z01812 Encounter for preprocedural laboratory examination: Secondary | ICD-10-CM | POA: Diagnosis present

## 2016-01-20 LAB — TYPE AND SCREEN
ABO/RH(D): B POS
Antibody Screen: NEGATIVE

## 2016-01-20 LAB — SURGICAL PCR SCREEN
MRSA, PCR: NEGATIVE
Staphylococcus aureus: NEGATIVE

## 2016-01-20 LAB — CBC
HEMATOCRIT: 37.6 % (ref 36.0–46.0)
HEMOGLOBIN: 12.2 g/dL (ref 12.0–15.0)
MCH: 22.1 pg — AB (ref 26.0–34.0)
MCHC: 32.4 g/dL (ref 30.0–36.0)
MCV: 68 fL — AB (ref 78.0–100.0)
Platelets: 279 10*3/uL (ref 150–400)
RBC: 5.53 MIL/uL — AB (ref 3.87–5.11)
RDW: 14.8 % (ref 11.5–15.5)
WBC: 7.3 10*3/uL (ref 4.0–10.5)

## 2016-01-20 LAB — BASIC METABOLIC PANEL
Anion gap: 6 (ref 5–15)
BUN: 10 mg/dL (ref 6–20)
CHLORIDE: 109 mmol/L (ref 101–111)
CO2: 26 mmol/L (ref 22–32)
Calcium: 9.3 mg/dL (ref 8.9–10.3)
Creatinine, Ser: 0.66 mg/dL (ref 0.44–1.00)
GFR calc Af Amer: >60 mL/min (ref >60)
GFR calc non Af Amer: >60 mL/min (ref >60)
GLUCOSE: 67 mg/dL (ref 65–99)
POTASSIUM: 3.7 mmol/L (ref 3.5–5.1)
SODIUM: 141 mmol/L (ref 135–145)

## 2016-01-20 LAB — ABO/RH: ABO/RH(D): B POS

## 2016-01-20 NOTE — Pre-Procedure Instructions (Addendum)
[name]  01/20/2016      [facility] [place], [place] [address] Phone: [phone] Fax: [phone]  [place] [address] Phone: [phone] Fax: [phone]  [facility] [place], [place] [address] [place] [address] Phone: [phone] Fax: [phone]    Your procedure is scheduled on 01/28/16  Report to [facility] Admitting at 630 A.M.  Call this number if you have problems the morning of surgery:  [phone]   Remember:  Do not eat food or drink liquids after midnight.  Take these medicines the morning of surgery with A SIP OF WATER    Mesalamine(lialda),oxycodone if needed, maxalt if needed  STOP all herbel meds, nsaids (aleve,naproxen,advil,ibuprofen) 7 days prior to surgery starting 01/21/16 including aspirin, duexis,any vitamins   Do not wear jewelry, make-up or nail polish.  Do not wear lotions, powders, or perfumes, or deoderant.  Do not shave 48 hours prior to surgery.  Men may shave face and neck.  Do not bring valuables to the hospital.  [facility] is not responsible for any belongings or valuables.  Contacts, dentures or bridgework may not be worn into surgery.  Leave your suitcase in the car.  After surgery it may be brought to your room.  For patients admitted to the hospital, discharge time will be determined by your treatment team.  Patients discharged the day of surgery will not be allowed to drive home.   Special instructions:  Special Instructions: [facility] - Preparing for Surgery  Before surgery, you can play an important role.  Because skin is not sterile, your skin needs to be as free of germs as possible.  You can reduce the number of germs on you skin by washing with CHG (chlorahexidine gluconate)  soap before surgery.  CHG is an antiseptic cleaner which kills germs and bonds with the skin to continue killing germs even after washing.  Please DO NOT use if you have an allergy to CHG or antibacterial soaps.  If your skin becomes reddened/irritated stop using the CHG and inform your nurse when you arrive at Short Stay.  Do not shave (including legs and underarms) for at least 48 hours prior to the first CHG shower.  You may shave your face.  Please follow these instructions carefully:   1.  Shower with CHG Soap the night before surgery and the morning of Surgery.  2.  If you choose to wash your hair, wash your hair first as usual with your normal shampoo.  3.  After you shampoo, rinse your hair and body thoroughly to remove the Shampoo.  4.  Use CHG as you would any other liquid soap.  You can apply chg directly  to the skin and wash gently with scrungie or a clean washcloth.  5.  Apply the CHG Soap to your body ONLY FROM THE NECK DOWN.  Do not use on open wounds or open sores.  Avoid contact with your eyes ears, mouth and genitals (private parts).  Wash genitals (private parts)       with your normal soap.  6.  Wash thoroughly, paying special attention to the area where your surgery will be performed.  7.  Thoroughly rinse your body with warm water from the neck down.  8.  DO NOT shower/wash with your normal soap after using and rinsing off the CHG Soap.  9.  Pat yourself dry with a clean towel.            10.  Wear clean pajamas.            11.  Place clean sheets on your bed the night of your first shower and do not sleep with pets.  Day of Surgery  Do not apply any lotions/deodorants the morning of surgery.  Please wear clean clothes to the hospital/surgery center.  Please read over the  fact sheets that you were given.

## 2016-01-21 NOTE — Progress Notes (Signed)
Anesthesia Chart Review:  Pt is a 60 year old female scheduled for L2-4 anterior lateral lumbar fusion XLIF on 01/28/2016 and L2-4 posterior spinal fusion, possible decompression on 01/29/2016 with Melina Schools, MD.   - PCP is [name], MD. Pt cleared for surgery at last office visit by Raiford Noble, PA.   PMH includes:  Ulcerative colitis, anemia, GERD. Never smoker. BMI 40  Medications reviewed.  Preoperative labs reviewed.    EKG 12/30/15: Sinus  Rhythm. Consider old anterior infarct.   Holter monitor 01/20/10: SR, occasional PACs/PVCs. Lots of artifact.   If no changes, I anticipate pt can proceed with surgery as scheduled.   Willeen Cass, FNP-BC [facility]/Anesthesiology Phone: [phone] 01/21/2016 2:40 PM

## 2016-01-28 ENCOUNTER — Inpatient Hospital Stay (HOSPITAL_COMMUNITY): Payer: BLUE CROSS/BLUE SHIELD

## 2016-01-28 ENCOUNTER — Encounter (HOSPITAL_COMMUNITY): Payer: Self-pay | Admitting: Anesthesiology

## 2016-01-28 ENCOUNTER — Inpatient Hospital Stay (HOSPITAL_COMMUNITY): Payer: BLUE CROSS/BLUE SHIELD | Admitting: Anesthesiology

## 2016-01-28 ENCOUNTER — Encounter (HOSPITAL_COMMUNITY): Payer: Self-pay | Admitting: *Deleted

## 2016-01-28 ENCOUNTER — Inpatient Hospital Stay (HOSPITAL_COMMUNITY): Payer: BLUE CROSS/BLUE SHIELD | Admitting: Emergency Medicine

## 2016-01-28 ENCOUNTER — Encounter (HOSPITAL_COMMUNITY)
Admission: RE | Disposition: A | Discharge status: Home / Self Care | Payer: Self-pay | Source: Ambulatory Visit | Attending: Orthopedic Surgery

## 2016-01-28 ENCOUNTER — Inpatient Hospital Stay (HOSPITAL_COMMUNITY)
Admission: RE | Admit: 2016-01-28 | Discharge: 2016-02-01 | DRG: 458 | Disposition: A | Discharge status: Home / Self Care | Payer: BLUE CROSS/BLUE SHIELD | Source: Ambulatory Visit | Attending: Orthopedic Surgery | Admitting: Orthopedic Surgery

## 2016-01-28 DIAGNOSIS — M5136 Other intervertebral disc degeneration, lumbar region: Secondary | ICD-10-CM | POA: Diagnosis present

## 2016-01-28 DIAGNOSIS — M5441 Lumbago with sciatica, right side: Secondary | ICD-10-CM | POA: Diagnosis present

## 2016-01-28 DIAGNOSIS — Z9889 Other specified postprocedural states: Secondary | ICD-10-CM | POA: Diagnosis not present

## 2016-01-28 DIAGNOSIS — Z419 Encounter for procedure for purposes other than remedying health state, unspecified: Secondary | ICD-10-CM

## 2016-01-28 DIAGNOSIS — M48061 Spinal stenosis, lumbar region without neurogenic claudication: Secondary | ICD-10-CM | POA: Diagnosis present

## 2016-01-28 DIAGNOSIS — R262 Difficulty in walking, not elsewhere classified: Secondary | ICD-10-CM

## 2016-01-28 DIAGNOSIS — M4186 Other forms of scoliosis, lumbar region: Secondary | ICD-10-CM | POA: Diagnosis present

## 2016-01-28 DIAGNOSIS — M545 Low back pain: Secondary | ICD-10-CM | POA: Diagnosis present

## 2016-01-28 DIAGNOSIS — M549 Dorsalgia, unspecified: Secondary | ICD-10-CM | POA: Diagnosis present

## 2016-01-28 DIAGNOSIS — Z6839 Body mass index (BMI) 39.0-39.9, adult: Secondary | ICD-10-CM

## 2016-01-28 DIAGNOSIS — Z981 Arthrodesis status: Secondary | ICD-10-CM

## 2016-01-28 HISTORY — PX: ANTERIOR LAT LUMBAR FUSION: SHX1168

## 2016-01-28 HISTORY — DX: Scoliosis, unspecified: M41.9

## 2016-01-28 HISTORY — PX: LUMBAR FUSION: SHX111

## 2016-01-28 SURGERY — ANTERIOR LATERAL LUMBAR FUSION 2 LEVELS
Anesthesia: General

## 2016-01-28 MED ORDER — LACTATED RINGERS IV SOLN
INTRAVENOUS | Status: DC | PRN
  Administered 2016-01-28 (×2): via INTRAVENOUS

## 2016-01-28 MED ORDER — HYDROMORPHONE HCL 1 MG/ML IJ SOLN
INTRAMUSCULAR | Status: AC
  Administered 2016-01-28: 0.5 mg via INTRAVENOUS
  Filled 2016-01-28: qty 1

## 2016-01-28 MED ORDER — MIDAZOLAM HCL 5 MG/5ML IJ SOLN
INTRAMUSCULAR | Status: DC | PRN
  Administered 2016-01-28: 2 mg via INTRAVENOUS

## 2016-01-28 MED ORDER — CEFAZOLIN SODIUM-DEXTROSE 2-4 GM/100ML-% IV SOLN
INTRAVENOUS | Status: AC
  Filled 2016-01-28: qty 100

## 2016-01-28 MED ORDER — 0.9 % SODIUM CHLORIDE (POUR BTL) OPTIME
TOPICAL | Status: DC | PRN
  Administered 2016-01-28 (×2): 1000 mL

## 2016-01-28 MED ORDER — PHENYLEPHRINE HCL 10 MG/ML IJ SOLN
INTRAVENOUS | Status: DC | PRN
  Administered 2016-01-28: 40 ug/min via INTRAVENOUS

## 2016-01-28 MED ORDER — PROPOFOL 10 MG/ML IV BOLUS
INTRAVENOUS | Status: AC
Start: 2016-01-28 — End: 2016-01-28
  Filled 2016-01-28: qty 20

## 2016-01-28 MED ORDER — THROMBIN 20000 UNITS EX SOLR
CUTANEOUS | Status: AC
  Filled 2016-01-28: qty 20000

## 2016-01-28 MED ORDER — THROMBIN 20000 UNITS EX KIT
PACK | CUTANEOUS | Status: DC | PRN
  Administered 2016-01-28: 20000 [IU] via TOPICAL

## 2016-01-28 MED ORDER — OXYCODONE HCL 5 MG PO TABS
5.0000 mg | ORAL_TABLET | Freq: Once | ORAL | Status: AC | PRN
  Administered 2016-01-28: 5 mg via ORAL

## 2016-01-28 MED ORDER — MIDAZOLAM HCL 2 MG/2ML IJ SOLN
INTRAMUSCULAR | Status: AC
  Filled 2016-01-28: qty 2

## 2016-01-28 MED ORDER — OXYCODONE HCL 5 MG/5ML PO SOLN: 5.0000 mg | Freq: Once | ORAL | Status: AC | PRN

## 2016-01-28 MED ORDER — METHOCARBAMOL 500 MG PO TABS
500.0000 mg | ORAL_TABLET | Freq: Four times a day (QID) | ORAL | Status: DC | PRN
  Administered 2016-01-28: 500 mg via ORAL
  Filled 2016-01-28 (×2): qty 1

## 2016-01-28 MED ORDER — OXYCODONE HCL 5 MG PO TABS
10.0000 mg | ORAL_TABLET | ORAL | Status: DC | PRN
  Administered 2016-01-28 (×2): 10 mg via ORAL
  Filled 2016-01-28: qty 2

## 2016-01-28 MED ORDER — BUPIVACAINE-EPINEPHRINE 0.25% -1:200000 IJ SOLN
INTRAMUSCULAR | Status: DC | PRN
  Administered 2016-01-28: 10 mL

## 2016-01-28 MED ORDER — CEFAZOLIN SODIUM-DEXTROSE 2-4 GM/100ML-% IV SOLN
2.0000 g | INTRAVENOUS | Status: DC
  Filled 2016-01-28: qty 100

## 2016-01-28 MED ORDER — SUCCINYLCHOLINE CHLORIDE 200 MG/10ML IV SOSY
PREFILLED_SYRINGE | INTRAVENOUS | Status: AC
  Filled 2016-01-28: qty 10

## 2016-01-28 MED ORDER — CEFAZOLIN SODIUM-DEXTROSE 2-4 GM/100ML-% IV SOLN
2.0000 g | INTRAVENOUS | Status: AC
  Administered 2016-01-28: 2 g via INTRAVENOUS

## 2016-01-28 MED ORDER — PHENOL 1.4 % MT LIQD: 1.0000 | OROMUCOSAL | Status: DC | PRN

## 2016-01-28 MED ORDER — PROPOFOL 10 MG/ML IV BOLUS
INTRAVENOUS | Status: DC | PRN
  Administered 2016-01-28: 200 mg via INTRAVENOUS

## 2016-01-28 MED ORDER — ACETAMINOPHEN 10 MG/ML IV SOLN
INTRAVENOUS | Status: AC
  Filled 2016-01-28: qty 100

## 2016-01-28 MED ORDER — BUPIVACAINE-EPINEPHRINE (PF) 0.25% -1:200000 IJ SOLN
INTRAMUSCULAR | Status: AC
  Filled 2016-01-28: qty 30

## 2016-01-28 MED ORDER — FENTANYL CITRATE (PF) 100 MCG/2ML IJ SOLN
INTRAMUSCULAR | Status: DC | PRN
  Administered 2016-01-28 (×3): 50 ug via INTRAVENOUS
  Administered 2016-01-28: 100 ug via INTRAVENOUS

## 2016-01-28 MED ORDER — HEMOSTATIC AGENTS (NO CHARGE) OPTIME
TOPICAL | Status: DC | PRN
  Administered 2016-01-28: 1 via TOPICAL

## 2016-01-28 MED ORDER — LACTATED RINGERS IV SOLN
INTRAVENOUS | Status: DC
  Administered 2016-01-28: 1000 mL via INTRAVENOUS

## 2016-01-28 MED ORDER — SODIUM CHLORIDE 0.9% FLUSH: 3.0000 mL | INTRAVENOUS | Status: DC | PRN

## 2016-01-28 MED ORDER — FENTANYL CITRATE (PF) 100 MCG/2ML IJ SOLN
INTRAMUSCULAR | Status: AC
  Filled 2016-01-28: qty 2

## 2016-01-28 MED ORDER — DEXAMETHASONE 4 MG PO TABS
4.0000 mg | ORAL_TABLET | Freq: Four times a day (QID) | ORAL | Status: DC
  Filled 2016-01-28: qty 1

## 2016-01-28 MED ORDER — METHOCARBAMOL 500 MG PO TABS
ORAL_TABLET | ORAL | Status: AC
  Filled 2016-01-28: qty 1

## 2016-01-28 MED ORDER — LACTATED RINGERS IV SOLN
INTRAVENOUS | Status: DC | PRN
  Administered 2016-01-28: 11:00:00 via INTRAVENOUS

## 2016-01-28 MED ORDER — SUCCINYLCHOLINE CHLORIDE 20 MG/ML IJ SOLN
INTRAMUSCULAR | Status: DC | PRN
  Administered 2016-01-28: 80 mg via INTRAVENOUS

## 2016-01-28 MED ORDER — SODIUM CHLORIDE 0.9 % IV SOLN: 250.0000 mL | INTRAVENOUS | Status: DC

## 2016-01-28 MED ORDER — SODIUM CHLORIDE 0.9% FLUSH
3.0000 mL | Freq: Two times a day (BID) | INTRAVENOUS | Status: DC
  Administered 2016-01-28: 3 mL via INTRAVENOUS

## 2016-01-28 MED ORDER — ONDANSETRON HCL 4 MG/2ML IJ SOLN
INTRAMUSCULAR | Status: DC | PRN
  Administered 2016-01-28: 4 mg via INTRAVENOUS

## 2016-01-28 MED ORDER — DEXTROSE 5 % IV SOLN
500.0000 mg | Freq: Four times a day (QID) | INTRAVENOUS | Status: DC | PRN
  Filled 2016-01-28: qty 5

## 2016-01-28 MED ORDER — ONDANSETRON HCL 4 MG/2ML IJ SOLN
INTRAMUSCULAR | Status: AC
  Filled 2016-01-28: qty 2

## 2016-01-28 MED ORDER — MENTHOL 3 MG MT LOZG: 1.0000 | LOZENGE | OROMUCOSAL | Status: DC | PRN

## 2016-01-28 MED ORDER — MORPHINE SULFATE (PF) 2 MG/ML IV SOLN: 1.0000 mg | INTRAVENOUS | Status: DC | PRN

## 2016-01-28 MED ORDER — DEXAMETHASONE SODIUM PHOSPHATE 10 MG/ML IJ SOLN
INTRAMUSCULAR | Status: DC | PRN
  Administered 2016-01-28: 10 mg via INTRAVENOUS

## 2016-01-28 MED ORDER — DEXAMETHASONE SODIUM PHOSPHATE 4 MG/ML IJ SOLN
4.0000 mg | Freq: Four times a day (QID) | INTRAMUSCULAR | Status: DC
  Administered 2016-01-28 – 2016-01-29 (×3): 4 mg via INTRAVENOUS
  Filled 2016-01-28 (×3): qty 1

## 2016-01-28 MED ORDER — ALBUMIN HUMAN 5 % IV SOLN
INTRAVENOUS | Status: DC | PRN
  Administered 2016-01-28: 11:00:00 via INTRAVENOUS

## 2016-01-28 MED ORDER — CEFAZOLIN SODIUM-DEXTROSE 2-4 GM/100ML-% IV SOLN
2.0000 g | INTRAVENOUS | Status: AC
  Administered 2016-01-29: 2 g via INTRAVENOUS
  Filled 2016-01-28: qty 100

## 2016-01-28 MED ORDER — OXYCODONE HCL 5 MG PO TABS
ORAL_TABLET | ORAL | Status: AC
  Administered 2016-01-28: 10 mg via ORAL
  Filled 2016-01-28: qty 1

## 2016-01-28 MED ORDER — ACETAMINOPHEN 10 MG/ML IV SOLN
INTRAVENOUS | Status: DC | PRN
  Administered 2016-01-28: 1000 mg via INTRAVENOUS

## 2016-01-28 MED ORDER — ONDANSETRON HCL 4 MG/2ML IJ SOLN: 4.0000 mg | INTRAMUSCULAR | Status: DC | PRN

## 2016-01-28 MED ORDER — FENTANYL CITRATE (PF) 100 MCG/2ML IJ SOLN
INTRAMUSCULAR | Status: AC
  Filled 2016-01-28: qty 4

## 2016-01-28 MED ORDER — OXYCODONE HCL 5 MG PO TABS
ORAL_TABLET | ORAL | Status: AC
  Filled 2016-01-28: qty 2

## 2016-01-28 MED ORDER — HYDROMORPHONE HCL 1 MG/ML IJ SOLN
0.2500 mg | INTRAMUSCULAR | Status: DC | PRN
  Administered 2016-01-28 (×3): 0.5 mg via INTRAVENOUS

## 2016-01-28 MED ORDER — LIDOCAINE HCL (CARDIAC) 20 MG/ML IV SOLN
INTRAVENOUS | Status: DC | PRN
  Administered 2016-01-28: 60 mg via INTRAVENOUS

## 2016-01-28 MED ORDER — CEFAZOLIN IN D5W 1 GM/50ML IV SOLN
1.0000 g | Freq: Three times a day (TID) | INTRAVENOUS | Status: AC
  Administered 2016-01-28 (×2): 1 g via INTRAVENOUS
  Filled 2016-01-28 (×4): qty 50

## 2016-01-28 MED ORDER — HYDROMORPHONE HCL 1 MG/ML IJ SOLN
INTRAMUSCULAR | Status: AC
  Administered 2016-01-28: 0.5 mg via INTRAVENOUS
  Filled 2016-01-28: qty 0.5

## 2016-01-28 MED ORDER — EPHEDRINE 5 MG/ML INJ
INTRAVENOUS | Status: AC
  Filled 2016-01-28: qty 10

## 2016-01-28 SURGICAL SUPPLY — 77 items
APPLIER CLIP 11 MED OPEN (CLIP)
ATTRACTOMAT 16X20 MAGNETIC DRP (DRAPES) ×2 IMPLANT
BLADE SURG 10 STRL SS (BLADE) ×2 IMPLANT
BLADE SURG ROTATE 9660 (MISCELLANEOUS) IMPLANT
BONE MATRIX OSTEOCEL PRO MED (Bone Implant) ×4 IMPLANT
BONE VIVIGEN FORMABLE 10CC (Bone Implant) ×2 IMPLANT
CAGE COROENT XL TI 12X18X50-10 (Cage) ×2 IMPLANT
CAGE COROENT XLW TI 10X22X50 (Cage) ×2 IMPLANT
CLIP APPLIE 11 MED OPEN (CLIP) IMPLANT
CORDS BIPOLAR (ELECTRODE) ×2 IMPLANT
COVER SURGICAL LIGHT HANDLE (MISCELLANEOUS) ×2 IMPLANT
DERMABOND ADVANCED (GAUZE/BANDAGES/DRESSINGS) ×1
DERMABOND ADVANCED .7 DNX12 (GAUZE/BANDAGES/DRESSINGS) ×1 IMPLANT
DRAPE C-ARM 42X72 X-RAY (DRAPES) ×2 IMPLANT
DRAPE C-ARMOR (DRAPES) ×2 IMPLANT
DRAPE ORTHO SPLIT 77X108 STRL (DRAPES) ×1
DRAPE POUCH INSTRU U-SHP 10X18 (DRAPES) ×2 IMPLANT
DRAPE SURG 17X23 STRL (DRAPES) ×2 IMPLANT
DRAPE SURG ORHT 6 SPLT 77X108 (DRAPES) ×1 IMPLANT
DRAPE U-SHAPE 47X51 STRL (DRAPES) ×2 IMPLANT
DRESSING AQUACEL AG SP 3.5X10 (GAUZE/BANDAGES/DRESSINGS) ×1 IMPLANT
DRSG AQUACEL AG ADV 3.5X10 (GAUZE/BANDAGES/DRESSINGS) ×2 IMPLANT
DRSG AQUACEL AG SP 3.5X10 (GAUZE/BANDAGES/DRESSINGS) ×2
DURAPREP 26ML APPLICATOR (WOUND CARE) ×2 IMPLANT
ELECT BLADE 4.0 EZ CLEAN MEGAD (MISCELLANEOUS) ×2
ELECT CAUTERY BLADE 6.4 (BLADE) ×2 IMPLANT
ELECT PENCIL ROCKER SW 15FT (MISCELLANEOUS) ×2 IMPLANT
ELECT REM PT RETURN 9FT ADLT (ELECTROSURGICAL) ×2
ELECTRODE BLDE 4.0 EZ CLN MEGD (MISCELLANEOUS) ×1 IMPLANT
ELECTRODE REM PT RTRN 9FT ADLT (ELECTROSURGICAL) ×1 IMPLANT
GAUZE SPONGE 4X4 16PLY XRAY LF (GAUZE/BANDAGES/DRESSINGS) IMPLANT
GLOVE BIO SURGEON STRL SZ 6.5 (GLOVE) ×2 IMPLANT
GLOVE BIOGEL PI IND STRL 6.5 (GLOVE) ×1 IMPLANT
GLOVE BIOGEL PI IND STRL 8.5 (GLOVE) ×1 IMPLANT
GLOVE BIOGEL PI INDICATOR 6.5 (GLOVE) ×1
GLOVE BIOGEL PI INDICATOR 8.5 (GLOVE) ×1
GLOVE BIOGEL PI ORTHO PRO 7.5 (GLOVE) ×1
GLOVE PI ORTHO PRO STRL 7.5 (GLOVE) ×1 IMPLANT
GLOVE SS BIOGEL STRL SZ 8.5 (GLOVE) ×1 IMPLANT
GLOVE SUPERSENSE BIOGEL SZ 8.5 (GLOVE) ×1
GLOVE SURG SS PI 7.0 STRL IVOR (GLOVE) ×2 IMPLANT
GOWN STRL REUS W/ TWL LRG LVL3 (GOWN DISPOSABLE) ×1 IMPLANT
GOWN STRL REUS W/TWL 2XL LVL3 (GOWN DISPOSABLE) ×4 IMPLANT
GOWN STRL REUS W/TWL LRG LVL3 (GOWN DISPOSABLE) ×1
KIT BASIN OR (CUSTOM PROCEDURE TRAY) ×2 IMPLANT
KIT DILATOR XLIF 5 (KITS) ×1 IMPLANT
KIT ROOM TURNOVER OR (KITS) ×2 IMPLANT
KIT SURGICAL ACCESS MAXCESS 4 (KITS) ×2 IMPLANT
KIT XLIF (KITS) ×1
MODULE NVM5 NEXT GEN EMG (NEEDLE) ×2 IMPLANT
NEEDLE I-PASS III (NEEDLE) IMPLANT
NEEDLE SPNL 18GX3.5 QUINCKE PK (NEEDLE) IMPLANT
NS IRRIG 1000ML POUR BTL (IV SOLUTION) ×4 IMPLANT
PACK LAMINECTOMY ORTHO (CUSTOM PROCEDURE TRAY) ×2 IMPLANT
PACK UNIVERSAL I (CUSTOM PROCEDURE TRAY) ×2 IMPLANT
PAD ARMBOARD 7.5X6 YLW CONV (MISCELLANEOUS) ×4 IMPLANT
SPONGE INTESTINAL PEANUT (DISPOSABLE) IMPLANT
SPONGE LAP 4X18 X RAY DECT (DISPOSABLE) ×2 IMPLANT
SPONGE SURGIFOAM ABS GEL 100 (HEMOSTASIS) IMPLANT
STRIP CLOSURE SKIN 1/2X4 (GAUZE/BANDAGES/DRESSINGS) ×2 IMPLANT
SURGIFLO W/THROMBIN 8M KIT (HEMOSTASIS) ×2 IMPLANT
SUT BONE WAX W31G (SUTURE) ×2 IMPLANT
SUT MON AB 3-0 SH 27 (SUTURE) ×2
SUT MON AB 3-0 SH27 (SUTURE) ×2 IMPLANT
SUT PROLENE 5 0 C 1 24 (SUTURE) IMPLANT
SUT SILK 2 0 TIES 10X30 (SUTURE) ×2 IMPLANT
SUT SILK 3 0 TIES 10X30 (SUTURE) ×2 IMPLANT
SUT VIC AB 1 CT1 27 (SUTURE) ×3
SUT VIC AB 1 CT1 27XBRD ANBCTR (SUTURE) ×3 IMPLANT
SUT VIC AB 1 CTX 36 (SUTURE) ×2
SUT VIC AB 1 CTX36XBRD ANBCTR (SUTURE) ×2 IMPLANT
SUT VIC AB 2-0 CT1 18 (SUTURE) ×4 IMPLANT
SYR BULB IRRIGATION 50ML (SYRINGE) ×2 IMPLANT
TOWEL OR 17X24 6PK STRL BLUE (TOWEL DISPOSABLE) ×2 IMPLANT
TOWEL OR 17X26 10 PK STRL BLUE (TOWEL DISPOSABLE) ×2 IMPLANT
TRAY FOLEY CATH 16FR SILVER (SET/KITS/TRAYS/PACK) ×2 IMPLANT
WATER STERILE IRR 1000ML POUR (IV SOLUTION) IMPLANT

## 2016-01-28 NOTE — Anesthesia Preprocedure Evaluation (Addendum)
Anesthesia Evaluation  Patient identified by MRN, date of birth, ID band Patient awake    Reviewed: Allergy & Precautions, NPO status , Patient's Chart, lab work & pertinent test results  Airway Mallampati: II  TM Distance: >3 FB Neck ROM: Full    Dental  (+) Teeth Intact, Dental Advisory Given   Pulmonary shortness of breath and with exertion,    breath sounds clear to auscultation       Cardiovascular negative cardio ROS   Rhythm:Regular Rate:Normal     Neuro/Psych  Headaches,  Neuromuscular disease negative psych ROS   GI/Hepatic Neg liver ROS, PUD, GERD  Medicated and Controlled,  Endo/Other  negative endocrine ROS  Renal/GU negative Renal ROS  negative genitourinary   Musculoskeletal  (+) Arthritis , Osteoarthritis,    Abdominal   Peds negative pediatric ROS (+)  Hematology negative hematology ROS (+)   Anesthesia Other Findings   Reproductive/Obstetrics negative OB ROS                          Lab Results  Component Value Date   WBC 7.3 01/20/2016   HGB 12.2 01/20/2016   HCT 37.6 01/20/2016   MCV 68.0 (L) 01/20/2016   PLT 279 01/20/2016   Lab Results  Component Value Date   CREATININE 0.66 01/20/2016   BUN 10 01/20/2016   NA 141 01/20/2016   K 3.7 01/20/2016   CL 109 01/20/2016   CO2 26 01/20/2016   Lab Results  Component Value Date   INR 1.06 01/03/2015   EKG: NSR   Anesthesia Physical Anesthesia Plan  ASA: II  Anesthesia Plan: General   Post-op Pain Management:    Induction: Intravenous  Airway Management Planned: Oral ETT  Additional Equipment:   Intra-op Plan:   Post-operative Plan: Extubation in OR  Informed Consent: I have reviewed the patients History and Physical, chart, labs and discussed the procedure including the risks, benefits and alternatives for the proposed anesthesia with the patient or authorized representative who has indicated  his/her understanding and acceptance.   Dental advisory given  Plan Discussed with: CRNA  Anesthesia Plan Comments:         Anesthesia Quick Evaluation

## 2016-01-28 NOTE — Brief Op Note (Signed)
01/28/2016  11:29 AM  PATIENT:  [name]  60 y.o. 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  PROCEDURE:  Procedure(s): ANTERIOR LATERAL LUMBAR FUSION 2 LEVELS XLIF L2-4 (N/A)  SURGEON:  Surgeon(s) and Role:    * Melina Schools, MD - Primary  PHYSICIAN ASSISTANT:   ASSISTANTS: Carmen Mayo   ANESTHESIA:   general  EBL:  Total I/O In: 1000 [I.V.:1000] Out: 500 [Urine:300; Blood:200]  BLOOD ADMINISTERED:none  DRAINS: none   LOCAL MEDICATIONS USED:  MARCAINE     SPECIMEN:  No Specimen  DISPOSITION OF SPECIMEN:  N/A  COUNTS:  YES  TOURNIQUET:  * No tourniquets in log *  DICTATION: .Other Dictation: Dictation Number [id number]  PLAN OF CARE: Admit to inpatient   PATIENT DISPOSITION:  PACU - hemodynamically stable.

## 2016-01-28 NOTE — Anesthesia Procedure Notes (Signed)
Procedure Name: Intubation Date/Time: 01/28/2016 8:47 AM Performed by: Carney Living Pre-anesthesia Checklist: Patient identified, Emergency Drugs available, Suction available, Patient being monitored and Timeout performed Patient Re-evaluated:Patient Re-evaluated prior to inductionOxygen Delivery Method: Circle system utilized Preoxygenation: Pre-oxygenation with 100% oxygen Intubation Type: IV induction Ventilation: Mask ventilation without difficulty Laryngoscope Size: Mac and 4 Grade View: Grade I Tube type: Oral Tube size: 7.0 mm Number of attempts: 1 Airway Equipment and Method: Stylet Placement Confirmation: ETT inserted through vocal cords under direct vision,  positive ETCO2 and breath sounds checked- equal and bilateral Secured at: 21 cm Tube secured with: Tape Dental Injury: Teeth and Oropharynx as per pre-operative assessment

## 2016-01-28 NOTE — H&P (Signed)
History of Present Illness  The patient is a 60 year old female who comes in today for a preoperative History and Physical. The patient is scheduled for a XLIF L2-4 on Day 1. Day 2 PSFI L2-4 w/possible Decompression to be performed by Dr. Duane Lope D. Rolena Infante, MD at [facility] on 01/28/16 and 01/29/16 . Please see the hospital record for complete dictated history and physical. The pt reports good health.  Additional reasons for visit:  Transition into care is described as the following: The patient is transitioning into care and a summary of care was reviewed.   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 Other medical problems  Sickle cell trait / disease - mother, sister, niece Severe allergy  Brother, Sister. Liver Disease, Chronic  Father. Congestive Heart Failure  Father. Diabetes Mellitus  Brother. Hypertension  Father, Mother, Sister.  Social History  Tobacco use  Never smoker. 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 Rizatriptan Benzoate (10MG Tablet, Oral) Active. (prn migranes) Lialda (1.2GM Tablet DR, Oral) Active. (#2 qd) OxyCODONE HCl (5MG Capsule, Oral) Active. (prn Rx'd in May by Dr Alvan Dame) Medications  Reconciled  Vitals  01/20/2016 7:59 AM Weight: 217 lb Height: 62.5in Body Surface Area: 1.99 m Body Mass Index: 39.06 kg/m  Pulse: 74 (Regular)  BP: 101/55 (Sitting, Left Arm, Standard) w/ 1" heels//smt  General General Appearance-Not in acute distress. Orientation-Oriented X3. Build & Nutrition-Well nourished and Well developed.  Integumentary General Characteristics Surgical Scars - no surgical scar evidence of previous lumbar surgery. Lumbar Spine-Skin examination of the lumbar spine is without deformity, skin lesions, lacerations or abrasions.  Chest and Lung Exam Auscultation Breath sounds - Normal and Clear.  Cardiovascular Auscultation Rhythm - Regular rate and rhythm.  Abdomen Palpation/Percussion Palpation and Percussion of the abdomen reveal - Soft, Non Tender and No Rebound tenderness.  Peripheral Vascular Lower Extremity Palpation - Posterior tibial pulse - Bilateral - 2+. Dorsalis pedis pulse - Bilateral - 2+.  Neurologic Sensation Lower Extremity - Bilateral - sensation is intact in the lower extremity. Reflexes Patellar Reflex - Bilateral - 2+. Achilles Reflex - Bilateral - 2+. Clonus - Bilateral - clonus not present. Hoffman's Sign - Bilateral - Hoffman's sign not present. Testing Seated Straight Leg Raise - Bilateral - Seated straight leg raise negative.  Musculoskeletal Spine/Ribs/Pelvis  Lumbosacral Spine: Inspection and Palpation - Tenderness - right lumbar paraspinals tender to palpation and left thoracic paraspinals tender to palpation. Strength and Tone: Strength - Hip Flexion - Bilateral - 5/5. Knee Extension - Bilateral - 5/5. Knee Flexion - Bilateral - 5/5. Ankle Dorsiflexion - Bilateral - 5/5. Ankle Plantarflexion - Bilateral - 5/5. Heel walk - Bilateral - unable to heel walk. Toe Walk - Bilateral - unable to walk on toes. ROM - Flexion - moderately decreased range of motion and painful. Extension -  moderately decreased  range of motion and painful. Left Lateral Bending - moderately decreased range of motion and painful. Right Lateral Bending - moderately decreased range of motion and painful. Right Rotation - moderately decreased range of motion and painful. Left Rotation - moderately decreased range of motion and painful. Pain - . Lumbosacral Spine - Waddell's Signs - no Waddell's signs present. Lower Extremity Range of Motion - No true hip, knee or ankle pain with range of motion. Gait and Station - [employer] - no assistive devices.  Her new MRI dated 11/27/2015 clearly shows the degenerative curve centered at the L3, L2 vertebral body. Multiple endplate degenerative changes most noted at the 3-4 level. At 2-3, 3-4, there is severe facet hypertrophy and degenerative arthrosis causing marked severe right lateral recess stenosis and compression. There is moderate left-sided stenosis at L4-5 and 5-1. It appears to be L4 is a neutral appearing vertebrae on x-ray and there is no significant rotational component to L4 seen on the MRI.   Assessment & Plan  Lateral Lumbar Fusion: Risksof surgery include, but are not limited to: Death, stroke, paralysis, nerve root damage/injury, bleeding, blood clots, loss of bowel/bladder control, sexual dysfunction, retrograde ejaculation, hardware failure, or mal-position, spinal fluid leak, adjacent segment disease, non-union, need for further surgery, ongoing or worse pain, and injury to bladder, bowel and abdominal contents, infection. Injury to the lumbar plexus resulting in temporary or permanent hip flexor weakness and difficulty walking. Need for supplemental posterior surgery including decompression and need for screw fixation.  Goal Of Surgery: Discussed that goal of surgery is to reduce pain and improve function and quality of life. Patient is aware that despite all appropriate treatment that there pain and function could be the same, worse, or different.  At this  point in time, the patient is absolutely miserable. Her back pain is debilitating. I do think the principal problem is her spinal stenosis, but this cannot be addressed appropriately without stabilizing the curve. Therefore, I think doing an XLIF at 2-3 and 3-4 would allow for curve correction and addressing the severe degenerative disc disease at that level. I would do that on the first day and then retake her x-ray that evening and if there was improvement in the curve and improvement in the right neuropathic leg pain, then on the second day, I would just move forward with a supplemental posterior instrumented fusion. I would also consider removing the facet joints, especially the right side at 2-3 and 3-4 as these were significantly arthritic causing much of the marked lateral recess stenosis. I have reviewed the risks and benefits of the surgery with the patient. All of her questions were encouraged and addressed. We will plan on moving with this once we have preoperative medical clearance.

## 2016-01-28 NOTE — Anesthesia Preprocedure Evaluation (Addendum)
Anesthesia Evaluation  Patient identified by MRN, date of birth, ID band Patient awake    Reviewed: Allergy & Precautions, NPO status , Patient's Chart, lab work & pertinent test results  History of Anesthesia Complications Negative for: history of anesthetic complications  Airway Mallampati: II  TM Distance: >3 FB Neck ROM: Full    Dental  (+) Teeth Intact, Dental Advisory Given   Pulmonary shortness of breath and with exertion,    breath sounds clear to auscultation       Cardiovascular negative cardio ROS   Rhythm:Regular     Neuro/Psych  Headaches,  Neuromuscular disease    GI/Hepatic Neg liver ROS, PUD, GERD  Medicated and Controlled,  Endo/Other  Morbid obesity  Renal/GU negative Renal ROS     Musculoskeletal  (+) Arthritis , Osteoarthritis,    Abdominal   Peds  Hematology   Anesthesia Other Findings   Reproductive/Obstetrics                            Anesthesia Physical Anesthesia Plan  ASA: II  Anesthesia Plan: General   Post-op Pain Management:    Induction: Intravenous  Airway Management Planned: Oral ETT  Additional Equipment: None  Intra-op Plan:   Post-operative Plan: Extubation in OR  Informed Consent: I have reviewed the patients History and Physical, chart, labs and discussed the procedure including the risks, benefits and alternatives for the proposed anesthesia with the patient or authorized representative who has indicated his/her understanding and acceptance.   Dental advisory given  Plan Discussed with: CRNA, Anesthesiologist and Surgeon  Anesthesia Plan Comments:        Anesthesia Quick Evaluation

## 2016-01-28 NOTE — Transfer of Care (Signed)
Immediate Anesthesia Transfer of Care Note  Patient: [name]  Procedure(s) Performed: Procedure(s): ANTERIOR LATERAL LUMBAR FUSION 2 LEVELS XLIF L2-4 (N/A)  Patient Location: PACU  Anesthesia Type:General  Level of Consciousness: awake, alert , oriented and patient cooperative  Airway & Oxygen Therapy: Patient Spontanous Breathing and Patient connected to nasal cannula oxygen  Post-op Assessment: Report given to RN, Post -op Vital signs reviewed and stable and Patient moving all extremities X 4  Post vital signs: Reviewed and stable  Last Vitals:  Vitals:   01/28/16 0729  BP: 123/88  Pulse: 87  Resp: 16  Temp: 36.9 C    Last Pain:  Vitals:   01/28/16 0729  TempSrc: Oral         Complications: No apparent anesthesia complications

## 2016-01-29 ENCOUNTER — Encounter (HOSPITAL_COMMUNITY)
Admission: RE | Disposition: A | Discharge status: Home / Self Care | Payer: Self-pay | Source: Ambulatory Visit | Attending: Orthopedic Surgery

## 2016-01-29 ENCOUNTER — Inpatient Hospital Stay (HOSPITAL_COMMUNITY): Payer: BLUE CROSS/BLUE SHIELD | Admitting: Anesthesiology

## 2016-01-29 ENCOUNTER — Inpatient Hospital Stay (HOSPITAL_COMMUNITY): Payer: BLUE CROSS/BLUE SHIELD

## 2016-01-29 ENCOUNTER — Encounter (HOSPITAL_COMMUNITY): Payer: Self-pay | Admitting: General Practice

## 2016-01-29 ENCOUNTER — Inpatient Hospital Stay (HOSPITAL_COMMUNITY)
Admission: RE | Admit: 2016-01-29 | Payer: BLUE CROSS/BLUE SHIELD | Source: Ambulatory Visit | Admitting: Orthopedic Surgery

## 2016-01-29 SURGERY — POSTERIOR LUMBAR FUSION 2 LEVEL
Anesthesia: General

## 2016-01-29 MED ORDER — METHOCARBAMOL 500 MG PO TABS
ORAL_TABLET | ORAL | Status: AC
  Administered 2016-01-29: 500 mg via ORAL
  Filled 2016-01-29: qty 1

## 2016-01-29 MED ORDER — LACTATED RINGERS IV SOLN: INTRAVENOUS | Status: DC

## 2016-01-29 MED ORDER — LUBIPROSTONE 24 MCG PO CAPS
24.0000 ug | ORAL_CAPSULE | Freq: Two times a day (BID) | ORAL | Status: DC
  Administered 2016-01-29 – 2016-02-01 (×6): 24 ug via ORAL
  Filled 2016-01-29 (×6): qty 1

## 2016-01-29 MED ORDER — OXYCODONE HCL 10 MG PO TABS: 10.0000 mg | ORAL_TABLET | ORAL | 0 refills | Status: AC | PRN

## 2016-01-29 MED ORDER — MIDAZOLAM HCL 2 MG/2ML IJ SOLN
INTRAMUSCULAR | Status: AC
  Filled 2016-01-29: qty 2

## 2016-01-29 MED ORDER — PROPOFOL 10 MG/ML IV BOLUS
INTRAVENOUS | Status: DC | PRN
  Administered 2016-01-29: 160 mg via INTRAVENOUS

## 2016-01-29 MED ORDER — HYDROMORPHONE HCL 1 MG/ML IJ SOLN
0.2500 mg | INTRAMUSCULAR | Status: DC | PRN
  Administered 2016-01-29 (×3): 0.5 mg via INTRAVENOUS

## 2016-01-29 MED ORDER — PHENOL 1.4 % MT LIQD
1.0000 | OROMUCOSAL | Status: DC | PRN
  Administered 2016-01-30: 1 via OROMUCOSAL
  Filled 2016-01-29: qty 177

## 2016-01-29 MED ORDER — CEFAZOLIN IN D5W 1 GM/50ML IV SOLN
1.0000 g | Freq: Three times a day (TID) | INTRAVENOUS | Status: AC
  Administered 2016-01-29 (×2): 1 g via INTRAVENOUS
  Filled 2016-01-29 (×2): qty 50

## 2016-01-29 MED ORDER — PROPOFOL 10 MG/ML IV BOLUS
INTRAVENOUS | Status: AC
  Filled 2016-01-29: qty 20

## 2016-01-29 MED ORDER — SODIUM CHLORIDE 0.9% FLUSH
3.0000 mL | Freq: Two times a day (BID) | INTRAVENOUS | Status: DC
  Administered 2016-01-30 – 2016-02-01 (×3): 3 mL via INTRAVENOUS

## 2016-01-29 MED ORDER — ONDANSETRON HCL 4 MG/2ML IJ SOLN
INTRAMUSCULAR | Status: AC
  Filled 2016-01-29: qty 2

## 2016-01-29 MED ORDER — DEXAMETHASONE SODIUM PHOSPHATE 10 MG/ML IJ SOLN
INTRAMUSCULAR | Status: DC | PRN
  Administered 2016-01-29: 10 mg via INTRAVENOUS

## 2016-01-29 MED ORDER — MIDAZOLAM HCL 5 MG/5ML IJ SOLN
INTRAMUSCULAR | Status: DC | PRN
  Administered 2016-01-29: 2 mg via INTRAVENOUS

## 2016-01-29 MED ORDER — MORPHINE SULFATE (PF) 2 MG/ML IV SOLN
1.0000 mg | INTRAVENOUS | Status: DC | PRN
  Administered 2016-01-29: 2 mg via INTRAVENOUS
  Filled 2016-01-29: qty 1

## 2016-01-29 MED ORDER — SODIUM CHLORIDE 0.9% FLUSH: 3.0000 mL | INTRAVENOUS | Status: DC | PRN

## 2016-01-29 MED ORDER — ACETAMINOPHEN 10 MG/ML IV SOLN
INTRAVENOUS | Status: DC | PRN
  Administered 2016-01-29: 1000 mg via INTRAVENOUS

## 2016-01-29 MED ORDER — HEMOSTATIC AGENTS (NO CHARGE) OPTIME
TOPICAL | Status: DC | PRN
  Administered 2016-01-29: 1 via TOPICAL

## 2016-01-29 MED ORDER — PROMETHAZINE HCL 25 MG/ML IJ SOLN: 6.2500 mg | INTRAMUSCULAR | Status: DC | PRN

## 2016-01-29 MED ORDER — SODIUM CHLORIDE 0.9 % IV SOLN: 250.0000 mL | INTRAVENOUS | Status: DC

## 2016-01-29 MED ORDER — LACTATED RINGERS IV SOLN
INTRAVENOUS | Status: DC | PRN
  Administered 2016-01-29 (×2): via INTRAVENOUS

## 2016-01-29 MED ORDER — ONDANSETRON HCL 4 MG/2ML IJ SOLN: 4.0000 mg | INTRAMUSCULAR | Status: DC | PRN

## 2016-01-29 MED ORDER — EPHEDRINE 5 MG/ML INJ
INTRAVENOUS | Status: AC
  Filled 2016-01-29: qty 10

## 2016-01-29 MED ORDER — BUPIVACAINE-EPINEPHRINE 0.25% -1:200000 IJ SOLN
INTRAMUSCULAR | Status: DC | PRN
  Administered 2016-01-29: 20 mL

## 2016-01-29 MED ORDER — SUCCINYLCHOLINE CHLORIDE 200 MG/10ML IV SOSY
PREFILLED_SYRINGE | INTRAVENOUS | Status: DC | PRN
  Administered 2016-01-29: 110 mg via INTRAVENOUS

## 2016-01-29 MED ORDER — LIDOCAINE HCL (CARDIAC) 20 MG/ML IV SOLN
INTRAVENOUS | Status: DC | PRN
  Administered 2016-01-29: 40 mg via INTRAVENOUS

## 2016-01-29 MED ORDER — ONDANSETRON 4 MG PO TBDP: 4.0000 mg | ORAL_TABLET | Freq: Three times a day (TID) | ORAL | 0 refills | Status: DC | PRN

## 2016-01-29 MED ORDER — METHOCARBAMOL 1000 MG/10ML IJ SOLN
500.0000 mg | Freq: Four times a day (QID) | INTRAVENOUS | Status: DC | PRN
  Filled 2016-01-29: qty 5

## 2016-01-29 MED ORDER — THROMBIN 20000 UNITS EX KIT
PACK | CUTANEOUS | Status: DC | PRN
  Administered 2016-01-29: 20000 [IU] via TOPICAL

## 2016-01-29 MED ORDER — MEPERIDINE HCL 25 MG/ML IJ SOLN: 6.2500 mg | INTRAMUSCULAR | Status: DC | PRN

## 2016-01-29 MED ORDER — METHOCARBAMOL 500 MG PO TABS
500.0000 mg | ORAL_TABLET | Freq: Four times a day (QID) | ORAL | Status: DC | PRN
  Administered 2016-01-29 – 2016-02-01 (×10): 500 mg via ORAL
  Filled 2016-01-29 (×11): qty 1

## 2016-01-29 MED ORDER — MENTHOL 3 MG MT LOZG: 1.0000 | LOZENGE | OROMUCOSAL | Status: DC | PRN

## 2016-01-29 MED ORDER — METHOCARBAMOL 500 MG PO TABS: 500.0000 mg | ORAL_TABLET | Freq: Four times a day (QID) | ORAL | 0 refills | Status: AC | PRN

## 2016-01-29 MED ORDER — FENTANYL CITRATE (PF) 100 MCG/2ML IJ SOLN
INTRAMUSCULAR | Status: DC | PRN
  Administered 2016-01-29 (×4): 50 ug via INTRAVENOUS

## 2016-01-29 MED ORDER — 0.9 % SODIUM CHLORIDE (POUR BTL) OPTIME
TOPICAL | Status: DC | PRN
  Administered 2016-01-29: 1000 mL

## 2016-01-29 MED ORDER — ONDANSETRON HCL 4 MG/2ML IJ SOLN
INTRAMUSCULAR | Status: DC | PRN
  Administered 2016-01-29: 4 mg via INTRAVENOUS

## 2016-01-29 MED ORDER — FLEET ENEMA 7-19 GM/118ML RE ENEM
1.0000 | ENEMA | Freq: Once | RECTAL | Status: DC
  Filled 2016-01-29: qty 1

## 2016-01-29 MED ORDER — PHENYLEPHRINE HCL 10 MG/ML IJ SOLN
INTRAVENOUS | Status: DC | PRN
  Administered 2016-01-29: 40 ug/min via INTRAVENOUS

## 2016-01-29 MED ORDER — SUCCINYLCHOLINE CHLORIDE 200 MG/10ML IV SOSY
PREFILLED_SYRINGE | INTRAVENOUS | Status: AC
  Filled 2016-01-29: qty 10

## 2016-01-29 MED ORDER — FENTANYL CITRATE (PF) 100 MCG/2ML IJ SOLN
INTRAMUSCULAR | Status: AC
  Filled 2016-01-29: qty 2

## 2016-01-29 MED ORDER — MAGNESIUM CITRATE PO SOLN
0.5000 | Freq: Once | ORAL | Status: AC
  Administered 2016-01-30: 0.5 via ORAL
  Filled 2016-01-29: qty 296

## 2016-01-29 MED ORDER — FENTANYL CITRATE (PF) 100 MCG/2ML IJ SOLN
INTRAMUSCULAR | Status: AC
  Filled 2016-01-29: qty 4

## 2016-01-29 MED ORDER — HYDROMORPHONE HCL 2 MG/ML IJ SOLN
INTRAMUSCULAR | Status: AC
  Filled 2016-01-29: qty 1

## 2016-01-29 MED ORDER — OXYCODONE HCL 5 MG PO TABS
10.0000 mg | ORAL_TABLET | ORAL | Status: DC | PRN
  Administered 2016-01-29 – 2016-02-01 (×15): 10 mg via ORAL
  Filled 2016-01-29 (×16): qty 2

## 2016-01-29 MED ORDER — OXYCODONE HCL 5 MG PO TABS
ORAL_TABLET | ORAL | Status: AC
  Administered 2016-01-29: 10 mg via ORAL
  Filled 2016-01-29: qty 2

## 2016-01-29 MED FILL — Thrombin For Soln 20000 Unit: CUTANEOUS | Qty: 1 | Status: AC

## 2016-01-29 SURGICAL SUPPLY — 66 items
BLADE SURG ROTATE 9660 (MISCELLANEOUS) IMPLANT
BUR EGG ELITE 4.0 (BURR) ×2 IMPLANT
CLIP NEUROVISION LG (CLIP) ×2 IMPLANT
CLSR STERI-STRIP ANTIMIC 1/2X4 (GAUZE/BANDAGES/DRESSINGS) ×2 IMPLANT
COVER MAYO STAND STRL (DRAPES) IMPLANT
COVER SURGICAL LIGHT HANDLE (MISCELLANEOUS) ×2 IMPLANT
DRAPE C-ARM 42X72 X-RAY (DRAPES) ×2 IMPLANT
DRAPE C-ARMOR (DRAPES) ×2 IMPLANT
DRAPE POUCH INSTRU U-SHP 10X18 (DRAPES) ×2 IMPLANT
DRAPE SURG 17X23 STRL (DRAPES) ×2 IMPLANT
DRAPE U-SHAPE 47X51 STRL (DRAPES) ×2 IMPLANT
DRSG AQUACEL AG ADV 3.5X 6 (GAUZE/BANDAGES/DRESSINGS) ×2 IMPLANT
DRSG AQUACEL AG ADV 3.5X10 (GAUZE/BANDAGES/DRESSINGS) ×2 IMPLANT
DURAPREP 26ML APPLICATOR (WOUND CARE) ×2 IMPLANT
ELECT BLADE 4.0 EZ CLEAN MEGAD (MISCELLANEOUS)
ELECT BLADE 6.5 EXT (BLADE) ×2 IMPLANT
ELECT CAUTERY BLADE 6.4 (BLADE) ×2 IMPLANT
ELECT PENCIL ROCKER SW 15FT (MISCELLANEOUS) ×2 IMPLANT
ELECT REM PT RETURN 9FT ADLT (ELECTROSURGICAL) ×2
ELECTRODE BLDE 4.0 EZ CLN MEGD (MISCELLANEOUS) IMPLANT
ELECTRODE REM PT RTRN 9FT ADLT (ELECTROSURGICAL) ×1 IMPLANT
GLOVE BIOGEL PI IND STRL 8.5 (GLOVE) ×1 IMPLANT
GLOVE BIOGEL PI INDICATOR 8.5 (GLOVE) ×1
GLOVE SS BIOGEL STRL SZ 8.5 (GLOVE) ×1 IMPLANT
GLOVE SUPERSENSE BIOGEL SZ 8.5 (GLOVE) ×1
GOWN STRL REUS W/ TWL LRG LVL3 (GOWN DISPOSABLE) ×1 IMPLANT
GOWN STRL REUS W/TWL 2XL LVL3 (GOWN DISPOSABLE) ×4 IMPLANT
GOWN STRL REUS W/TWL LRG LVL3 (GOWN DISPOSABLE) ×1
GUIDEWIRE NITINOL BEVEL TIP (WIRE) ×12 IMPLANT
KIT BASIN OR (CUSTOM PROCEDURE TRAY) ×2 IMPLANT
KIT POSITION SURG JACKSON T1 (MISCELLANEOUS) ×2 IMPLANT
KIT ROOM TURNOVER OR (KITS) ×2 IMPLANT
MODULE NVM5 NEXT GEN EMG (NEEDLE) ×2 IMPLANT
NEEDLE 22X1 1/2 (OR ONLY) (NEEDLE) ×2 IMPLANT
NEEDLE I-PASS III (NEEDLE) ×2 IMPLANT
NEEDLE SPNL 18GX3.5 QUINCKE PK (NEEDLE) ×2 IMPLANT
NS IRRIG 1000ML POUR BTL (IV SOLUTION) ×2 IMPLANT
PACK LAMINECTOMY ORTHO (CUSTOM PROCEDURE TRAY) ×2 IMPLANT
PACK UNIVERSAL I (CUSTOM PROCEDURE TRAY) ×2 IMPLANT
PAD ARMBOARD 7.5X6 YLW CONV (MISCELLANEOUS) ×4 IMPLANT
PATTIES SURGICAL .5 X.5 (GAUZE/BANDAGES/DRESSINGS) IMPLANT
PATTIES SURGICAL .5 X1 (DISPOSABLE) ×2 IMPLANT
POSITIONER HEAD PRONE TRACH (MISCELLANEOUS) ×2 IMPLANT
PROBE BALL TIP NVM5 SNG USE (BALLOONS) ×2 IMPLANT
ROD RELINE MAS LORD 5.5X75MM (Rod) ×2 IMPLANT
ROD RELINE TI MAS 5.5X70MM LD (Rod) ×2 IMPLANT
SCREW LOCK RELINE 5.5 TULIP (Screw) ×12 IMPLANT
SCREW RELINE MAS POLY 6.5X40MM (Screw) ×4 IMPLANT
SCREW RELINE RED 6.5X45MM POLY (Screw) ×8 IMPLANT
SPONGE LAP 4X18 X RAY DECT (DISPOSABLE) ×4 IMPLANT
SPONGE SURGIFOAM ABS GEL 100 (HEMOSTASIS) ×2 IMPLANT
SURGIFLO W/THROMBIN 8M KIT (HEMOSTASIS) IMPLANT
SUT BONE WAX W31G (SUTURE) ×2 IMPLANT
SUT MNCRL AB 3-0 PS2 18 (SUTURE) ×2 IMPLANT
SUT VIC AB 1 CT1 18XCR BRD 8 (SUTURE) ×2 IMPLANT
SUT VIC AB 1 CT1 8-18 (SUTURE) ×2
SUT VIC AB 1 CTX 36 (SUTURE)
SUT VIC AB 1 CTX36XBRD ANBCTR (SUTURE) IMPLANT
SUT VIC AB 2-0 CT1 18 (SUTURE) ×6 IMPLANT
SYR BULB IRRIGATION 50ML (SYRINGE) ×2 IMPLANT
SYR CONTROL 10ML LL (SYRINGE) ×2 IMPLANT
TOWEL OR 17X24 6PK STRL BLUE (TOWEL DISPOSABLE) ×2 IMPLANT
TOWEL OR 17X26 10 PK STRL BLUE (TOWEL DISPOSABLE) ×2 IMPLANT
TRAY FOLEY CATH 16FRSI W/METER (SET/KITS/TRAYS/PACK) ×2 IMPLANT
WATER STERILE IRR 1000ML POUR (IV SOLUTION) ×2 IMPLANT
YANKAUER SUCT BULB TIP NO VENT (SUCTIONS) ×2 IMPLANT

## 2016-01-29 NOTE — Progress Notes (Signed)
OT Cancellation    01/29/16 0600  OT Visit Information  Last OT Received On 01/29/16  Reason Eval/Treat Not Completed Patient at procedure or test/ unavailable (in [place])  [facility], OTR/L  [phone] 01/29/2016

## 2016-01-29 NOTE — Brief Op Note (Signed)
01/28/2016 - 01/29/2016  10:49 AM  PATIENT:  [name]  60 y.o. female  PRE-OPERATIVE DIAGNOSIS:  DEGENERATIVE LUMBER SCOLIOSIS WITH FORAMINAL LATERAL RECESS STEONSIS  POST-OPERATIVE DIAGNOSIS:  DEGENERATIVE LUMBER SCOLIOSIS WITH FORAMINAL LATERAL RECESS STEONSIS  PROCEDURE:  Procedure(s): POSTERIOR SPINAL FUSION ANTIBODY L2-4 (N/A)  SURGEON:  Surgeon(s) and Role:    * Melina Schools, MD - Primary  PHYSICIAN ASSISTANT:   ASSISTANTS: Carmen Mayo   ANESTHESIA:   general  EBL:  Total I/O In: 1000 [I.V.:1000] Out: 500 [Urine:350; Blood:150]  BLOOD ADMINISTERED:none  DRAINS: none   LOCAL MEDICATIONS USED:  MARCAINE     SPECIMEN:  No Specimen  DISPOSITION OF SPECIMEN:  N/A  COUNTS:  YES  TOURNIQUET:  * No tourniquets in log *  DICTATION: .Other Dictation: Dictation Number [id number]  PLAN OF CARE: Admit to inpatient   PATIENT DISPOSITION:  PACU - hemodynamically stable.

## 2016-01-29 NOTE — Anesthesia Procedure Notes (Signed)
Procedure Name: Intubation Date/Time: 01/29/2016 7:43 AM Performed by: Carney Living Pre-anesthesia Checklist: Patient identified, Emergency Drugs available, Suction available, Patient being monitored and Timeout performed Patient Re-evaluated:Patient Re-evaluated prior to inductionOxygen Delivery Method: Circle system utilized Preoxygenation: Pre-oxygenation with 100% oxygen Intubation Type: IV induction Ventilation: Mask ventilation without difficulty Laryngoscope Size: Mac and 4 Grade View: Grade I Tube type: Oral Tube size: 7.0 mm Number of attempts: 1 Airway Equipment and Method: Stylet Placement Confirmation: ETT inserted through vocal cords under direct vision,  positive ETCO2 and breath sounds checked- equal and bilateral Secured at: 21 cm Tube secured with: Tape Dental Injury: Teeth and Oropharynx as per pre-operative assessment

## 2016-01-29 NOTE — Op Note (Signed)
[name], [name] NO.:  [id number]  MEDICAL RECORD NO.:  [id number]  LOCATION:                                 FACILITY:  PHYSICIAN:  Krystyn Picking D. Rolena Infante, M.D.      DATE OF BIRTH:  DATE OF PROCEDURE:  01/28/2016 DATE OF DISCHARGE:                              OPERATIVE REPORT   PREOPERATIVE DIAGNOSIS:  Degenerative lumbar spinal stenosis with degenerative scoliosis.  POSTOPERATIVE DIAGNOSIS:  Degenerative lumbar spinal stenosis with degenerative scoliosis.  OPERATIVE PROCEDURE:  Lateral interbody fusion at L2-3, L3-4.  IMPLANT SYSTEM USED:  NuVasive titanium interbody cage at L2-3, I used a size 12 x 18 x 50.  Packed with ViviGen and Osteocel.  At L3-4, I used a size 10 x 21 parallel packed with the same allograft.  COMPLICATIONS:  None throughout the case.  Neuro monitoring, SSEPs, and free running EMGs remained normal with no evidence of injury or traction or compression of the lumbar plexus.  FIRST ASSISTANT:  Ronette Deter, my PA.  HISTORY:  This is a very pleasant 60 year old woman who has been having progressive debilitating back, buttock, and neuropathic leg pain.  The patient has significant stenosis at L3-4 and L4-5.  However, she also has degenerative scoliosis.  After discussing treatment options, we elected to proceed with the two-staged anterior-posterior fusion.  All appropriate risks, benefits, and alternatives were explained to the patient.  Consent was obtained.  The patient presents today to the OR for the planned stage I lateral interbody fusion.  OPERATIVE NOTE:  The patient was brought to the operating room and placed supine on the operating table.  After successful induction of general anesthesia and endotracheal intubation, TEDs, SCDs, and a Foley were inserted and the neuro monitoring tabs were applied to the surface of the body.  The patient was turned into the right lateral decubitus position (left side down).  Axillary roll  was placed.  The knee and ankle were padded.  All bony prominences were well padded, and the patient was secured to the table in the lateral position with tape. Confirming position with fluoro in both the AP and lateral planes.  Once the patient was properly positioned, the lateral flank was prepped and draped in a standard fashion.  Time-out was taken confirming patient, procedure, and all other pertinent important data.  Once this was done, I identified the anterior and posterior margins of the L3-4 and L2-3 disk spaces.  I then elected to use an incision centered between these two.  I infiltrated the incision with 0.25% Marcaine with epi and then made the incision along the mid surface of the L3 vertebral body.  Sharp dissection was carried out down to the fascia of the external oblique.  This fascia was gently incised and then I made a secondary counter incision 1 fingerbreadth posteriorly.  I dissected down to the posterior retroperitoneal fascia and then entered that with a [employer] and then began with my finger to dissect the retroperitoneal space.  I then dissected from the undersurface of the external oblique until I could see my finger through the lateral incision.  I then advanced my first dilating tube down to the  surface of the iliopsoas at the L3-4 level.  I then stimulated the surface to ensure that I was not near the plexus and once this was done, I advanced the probe through the plexus down to the lateral aspect of the L3-4 disk.  I then stimulated circumferentially confirming that there was no irritation, compression, or trauma to the plexus.  Once this was done, I then sequentially dilated up until I had my final dilator. Again with each dilatation, I did stimulate circumferentially.  I then placed my working trocar and secured it to the table.  I then stimulated behind the blade anterior and superior and inferiorly to confirm that there was no compression of the  lumbar plexus.  In addition, there were no abnormal free running EMGs noted.  I secured the retractor to the disk space and then properly exposed the lateral aspect of the L3-4 disk.  An annulotomy was performed with a 10-blade scalpel and then using a combination of pituitary rongeurs, curettes and Kerrison rongeurs, I removed all of the disk material at the L3-4 level.  I then used my osteotome to release the disk from the contralateral side.  Once the contralateral annulotomy was performed, I then scraped the endplates to ensure I had removed the cartilage and I had bleeding subchondral bone. I then trialed starting at an 8 parallel intervertebral spacer and ending with a size 10 parallel by 21 spacer.  This provided excellent restoration of vertebral height and correction of the scoliosis.  At this point, I elected to use this.  I irrigated the wound copiously with normal saline and then obtained the implant, packed it with the ViviGen and Osteocel, and then malleted it to the appropriate depth. Satisfactory position was noted in both the AP and lateral planes on fluoro.  At this point in time, I then removed the trocar and then repositioned my retractor at the L2-3 space.  Using the exact same technique that I had used at L3-4, I docked at the L2-3 level, sequentially dilated and placed my working retractor and trocar.  I then removed the disk material again in a similar fashion.  I trialed devices and elected to use the size 12 parallel by 18.  This had the best fit and provided excellent restoration of vertebral height.  This implant was then [facility] to the final position and was satisfactory.  At this point, I irrigated the wound copiously with normal saline and then I obtained hemostasis using bipolar electrocautery and FloSeal.  I then removed the retractor, took my final x-rays which were satisfactory.  I closed the fascia of the oblique with interrupted #1 Vicryl sutures,  and then superficial with 2-0 Vicryl sutures and a 3-0 Monocryl.  The second incision posteriorly was also closed in a layered fashion with #1 Vicryl, 2-0 Vicryl and 3-0 Monocryl.  Steri-Strips and dry dressing were applied and the patient was extubated and transferred to the PACU without incident.  At the end of the case, all needle and sponge counts were correct.  There were no adverse intraoperative events.  The patient will be admitted to the inpatient status.  X-rays will be done today. If there is improvement in her neurogenic claudication pain, then we will plan on moving forward with a straight 4 posterior instrumented fixation tomorrow.  If not, then I may need to supplement this with a posterior decompression in addition to the fusion.     Maliik Karner D. Rolena Infante, M.D.     DDB/MEDQ  D:  01/28/2016  T:  01/29/2016  Job:  [id number]  cc:   Dr. Rolena Infante' office

## 2016-01-29 NOTE — Transfer of Care (Signed)
Immediate Anesthesia Transfer of Care Note  Patient: [name]  Procedure(s) Performed: Procedure(s): POSTERIOR SPINAL FUSION ANTIBODY L2-4 (N/A)  Patient Location: PACU  Anesthesia Type:General  Level of Consciousness: awake, alert , oriented and patient cooperative  Airway & Oxygen Therapy: Patient Spontanous Breathing and Patient connected to nasal cannula oxygen  Post-op Assessment: Report given to RN, Post -op Vital signs reviewed and stable and Patient moving all extremities X 4  Post vital signs: Reviewed and stable  Last Vitals:  Vitals:   01/29/16 0120 01/29/16 0516  BP: 116/67 (!) 149/74  Pulse: 92 (!) 101  Resp: 18 18  Temp: 37 C 36.8 C    Last Pain:  Vitals:   01/29/16 0516  TempSrc: Oral  PainSc:       Patients Stated Pain Goal: 3 (12/87/86 7672)  Complications: No apparent anesthesia complications

## 2016-01-29 NOTE — Evaluation (Signed)
Physical Therapy Evaluation Patient Details Name: [name] MRN: [id number] DOB: 07-19-1955 Today's Date: 01/29/2016   History of Present Illness  Patient is a 60 y/o female admitted with lumbar stenosis, now s/p L2-4 Posterior spinal fusion.  Clinical Impression  Patient presents with decreased independence with mobility due to deficits listed in PT problem list.  She will benefit from skilled PT in the acute setting to allow return home with family support.  Likely no initial PT follow up needs.    Follow Up Recommendations No PT follow up    Equipment Recommendations  None recommended by PT    Recommendations for Other Services       Precautions / Restrictions Precautions Precautions: Back Precaution Booklet Issued: No Precaution Comments: reviewed preacautions verbally Required Braces or Orthoses: Spinal Brace Spinal Brace: Lumbar corset;Applied in sitting position      Mobility  Bed Mobility               General bed mobility comments: up in chair, brief demo of technique for sit to side  Transfers Overall transfer level: Needs assistance Equipment used: Rolling walker (2 wheeled) Transfers: Sit to/from Stand Sit to Stand: Min assist         General transfer comment: assist for balance, safety  Ambulation/Gait Ambulation/Gait assistance: Min guard Ambulation Distance (Feet): 60 Feet Assistive device: Rolling walker (2 wheeled) Gait Pattern/deviations: Step-through pattern;Decreased stride length;Wide base of support     General Gait Details: maintained precautions throughout, cues for technique on turns  Stairs            Wheelchair Mobility    Modified Rankin (Stroke Patients Only)       Balance Overall balance assessment: Needs assistance   Sitting balance-Leahy Scale: Good       Standing balance-Leahy Scale: Fair Standing balance comment: UE support for ambulation                             Pertinent  Vitals/Pain Pain Assessment: 0-10 Pain Score: 9  Pain Location: initially in R side low back, improved with ambulation Pain Descriptors / Indicators: Aching Pain Intervention(s): Monitored during session;Repositioned    Home Living Family/patient expects to be discharged to:: Private residence Living Arrangements: Alone Available Help at Discharge: Family;Available 24 hours/day Type of Home: House (condo) Home Access: Stairs to enter Entrance Stairs-Rails: Right;Left;Can reach both Technical brewer of Steps: 16 Home Layout: One level Home Equipment: Environmental consultant - 2 wheels      Prior Function Level of Independence: Independent               Hand Dominance        Extremity/Trunk Assessment   Upper Extremity Assessment Upper Extremity Assessment: Defer to OT evaluation    Lower Extremity Assessment Lower Extremity Assessment: Overall WFL for tasks assessed       Communication   Communication: No difficulties  Cognition Arousal/Alertness: Awake/alert Behavior During Therapy: WFL for tasks assessed/performed Overall Cognitive Status: Within Functional Limits for tasks assessed                      General Comments      Exercises     Assessment/Plan    PT Assessment Patient needs continued PT services  PT Problem List Decreased balance;Decreased strength;Decreased activity tolerance;Decreased mobility;Decreased knowledge of precautions;Decreased knowledge of use of DME;Pain          PT Treatment Interventions DME instruction;Gait  training;Stair training;Functional mobility training;Therapeutic exercise;Therapeutic activities;Patient/family education;Balance training    PT Goals (Current goals can be found in the Care Plan section)  Acute Rehab PT Goals Patient Stated Goal: To return home PT Goal Formulation: With patient Time For Goal Achievement: 02/03/16 Potential to Achieve Goals: Good    Frequency Min 5X/week   Barriers to discharge         Co-evaluation               End of Session Equipment Utilized During Treatment: Back brace Activity Tolerance: Patient tolerated treatment well Patient left: in chair;with call bell/phone within reach           Time: 0228-4069 PT Time Calculation (min) (ACUTE ONLY): 17 min   Charges:   PT Evaluation $PT Eval Moderate Complexity: 1 Procedure     PT G CodesReginia Naas 2016/02/08, 2:59 PM  Magda Kiel, [place] Feb 08, 2016

## 2016-01-29 NOTE — Progress Notes (Signed)
PT Cancellation Note  Patient Details Name: [name] MRN: [id number] DOB: [birth year]   Cancelled Treatment:    Reason Eval/Treat Not Completed: PT ordered received, chart reviewed. Pt currently in OR. Will check back as schedule allows to attempt PT eval.    Thelma Comp 01/29/2016, 7:16 AM   Rolinda Roan, PT, DPT Acute Rehabilitation Services Pager: [phone]

## 2016-01-29 NOTE — Progress Notes (Signed)
    Subjective: Procedure(s) (LRB): POSTERIOR SPINAL FUSION ANTIBODY L2-4 POSSIBLE DECOMPRESSION (N/A) Day of Surgery  Patient reports pain as 2 on 0-10 scale.  Reports decreased leg pain reports incisional back pain   N/A void - foley in place Negative bowel movement Negative flatus Negative chest pain or shortness of breath  Objective: Vital signs in last 24 hours: Temp:  [97.8 F (36.6 C)-98.6 F (37 C)] 98.2 F (36.8 C) (12/14 0516) Pulse Rate:  [84-101] 101 (12/14 0516) Resp:  [12-20] 18 (12/14 0516) BP: (106-149)/(59-88) 149/74 (12/14 0516) SpO2:  [93 %-100 %] 98 % (12/14 0516) Weight:  [98.4 kg (217 lb)] 98.4 kg (217 lb) (12/13 0729)  Intake/Output from previous day: 12/13 0701 - 12/14 0700 In: 3610.4 [I.V.:3360.4; IV Piggyback:250] Out: 3400 [Urine:3200; Blood:200]  Labs: No results for input(s): WBC, RBC, HCT, PLT in the last 72 hours. No results for input(s): NA, K, CL, CO2, BUN, CREATININE, GLUCOSE, CALCIUM in the last 72 hours. No results for input(s): LABPT, INR in the last 72 hours.  Physical Exam: Neurologically intact ABD soft Intact pulses distally Incision: dressing C/D/I and no drainage Compartment soft no hip flexor pain or weakness noted  Assessment/Plan: Patient stable  xrays alignment improved.  Continue mobilization with physical therapy Continue care  Patient with little neuropathic leg pain today Plan on PSFI only today Reviewed risks/benefits  Melina Schools, MD [place] [phone]

## 2016-01-29 NOTE — Anesthesia Postprocedure Evaluation (Signed)
Anesthesia Post Note  Patient: [name]  Procedure(s) Performed: Procedure(s) (LRB): POSTERIOR SPINAL FUSION ANTIBODY L2-4 (N/A)  Patient location during evaluation: PACU Anesthesia Type: General Level of consciousness: awake and alert Pain management: pain level controlled Vital Signs Assessment: post-procedure vital signs reviewed and stable Respiratory status: spontaneous breathing, nonlabored ventilation, respiratory function stable and patient connected to nasal cannula oxygen Cardiovascular status: blood pressure returned to baseline and stable Postop Assessment: no signs of nausea or vomiting Anesthetic complications: no    Last Vitals:  Vitals:   01/29/16 1200 01/29/16 1225  BP: 114/76 113/70  Pulse:  84  Resp:  16  Temp:  36.8 C    Last Pain:  Vitals:   01/29/16 1225  TempSrc: Oral  PainSc:                  Effie Berkshire

## 2016-01-30 ENCOUNTER — Inpatient Hospital Stay (HOSPITAL_COMMUNITY): Payer: BLUE CROSS/BLUE SHIELD

## 2016-01-30 ENCOUNTER — Encounter (HOSPITAL_COMMUNITY): Payer: Self-pay | Admitting: Orthopedic Surgery

## 2016-01-30 DIAGNOSIS — Z9889 Other specified postprocedural states: Secondary | ICD-10-CM

## 2016-01-30 MED FILL — Thrombin For Soln 20000 Unit: CUTANEOUS | Qty: 1 | Status: AC

## 2016-01-30 NOTE — Evaluation (Signed)
Occupational Therapy Evaluation Patient Details Name: [name] MRN: [id number] DOB: Oct 30, 1955 Today's Date: 01/30/2016    History of Present Illness Patient is a 60 y/o female admitted with lumbar stenosis, now s/p L2-4 Posterior spinal fusion.   Clinical Impression   Pt was independent prior to admission. Presents with increased pain and decreased stand balance. Pt educated to avoid sitting for greater than 30 minutes at a time. Pt requiring moderate assistance for bed mobility and LB dressing and min guard assist for OOB mobility. Will follow acutely. Pt will have 24 hour care of her family at home.     Follow Up Recommendations  No OT follow up    Equipment Recommendations  None recommended by OT    Recommendations for Other Services       Precautions / Restrictions Precautions Precautions: Back Precaution Comments: educated in back precautions related to ADL and IADL Required Braces or Orthoses: Spinal Brace Spinal Brace: Lumbar corset;Applied in sitting position Restrictions Weight Bearing Restrictions: No      Mobility Bed Mobility Overal bed mobility: Needs Assistance Bed Mobility: Sit to Sidelying;Rolling Rolling: Modified independent (Device/Increase time)       Sit to sidelying: Mod assist General bed mobility comments: assisted LEs back into bed  Transfers Overall transfer level: Needs assistance Equipment used: Rolling walker (2 wheeled) Transfers: Sit to/from Stand Sit to Stand: Min guard         General transfer comment: increased time, cues for hand placement    Balance Overall balance assessment: Needs assistance   Sitting balance-Leahy Scale: Good       Standing balance-Leahy Scale: Fair Standing balance comment: can release walker with one hand in standing                            ADL Overall ADL's : Needs assistance/impaired Eating/Feeding: Independent;Sitting   Grooming: Wash/dry hands;Sitting;Set up    Upper Body Bathing: Minimal assistance;Sitting Upper Body Bathing Details (indicate cue type and reason): recommended long handled bath sponge Lower Body Bathing: Minimal assistance;Sit to/from stand Lower Body Bathing Details (indicate cue type and reason): recommended long handled bath sponge and reacher Upper Body Dressing : Set up;Sitting Upper Body Dressing Details (indicate cue type and reason): including back brace Lower Body Dressing: Sit to/from stand;Moderate assistance Lower Body Dressing Details (indicate cue type and reason): recommended reacher, long shoe horn and sock aid Toilet Transfer: Min guard;RW;Ambulation;BSC (over toilet)   Toileting- Clothing Manipulation and Hygiene: Min guard;Sit to/from stand Toileting - Clothing Manipulation Details (indicate cue type and reason): educated in use of tongs for pericare and wet wipes   Tub/Shower Transfer Details (indicate cue type and reason): pt plans to sponge bathe Functional mobility during ADLs: Min guard;Rolling walker General ADL Comments: educated in safe footwear, multiple uses of 3 in 1, transporting items with walker safely     Vision     Perception     Praxis      Pertinent Vitals/Pain Pain Assessment: 0-10 Pain Score: 8  Pain Location: back Pain Descriptors / Indicators: Aching;Throbbing Pain Intervention(s): Repositioned;RN gave pain meds during session;Monitored during session     Hand Dominance Right   Extremity/Trunk Assessment Upper Extremity Assessment Upper Extremity Assessment: Overall WFL for tasks assessed   Lower Extremity Assessment Lower Extremity Assessment: Defer to PT evaluation       Communication Communication Communication: No difficulties   Cognition Arousal/Alertness: Awake/alert Behavior During Therapy: [facility] for tasks assessed/performed  Overall Cognitive Status: Within Functional Limits for tasks assessed                     General Comments       Exercises        Shoulder Instructions      Home Living Family/patient expects to be discharged to:: Private residence Living Arrangements: Alone Available Help at Discharge: Family;Available 24 hours/day Type of Home: House (condo) Home Access: Stairs to enter [employer] of Steps: 16 Entrance Stairs-Rails: Right;Left;Can reach both Home Layout: One level     Bathroom Shower/Tub: Teacher, early years/pre: Handicapped height     Home Equipment: Environmental consultant - 2 wheels;Bedside commode          Prior Functioning/Environment Level of Independence: Independent                 OT Problem List: Decreased strength;Impaired balance (sitting and/or standing);Obesity;Pain;Decreased knowledge of use of DME or AE;Decreased knowledge of precautions   OT Treatment/Interventions: Self-care/ADL training;DME and/or AE instruction;Patient/family education    OT Goals(Current goals can be found in the care plan section) Acute Rehab OT Goals Patient Stated Goal: To return home OT Goal Formulation: With patient Time For Goal Achievement: 02/06/16 Potential to Achieve Goals: Good ADL Goals Pt Will Perform Grooming: with supervision;standing Pt Will Perform Lower Body Dressing: with supervision;with adaptive equipment;sit to/from stand Pt Will Transfer to Toilet: with supervision;ambulating;bedside commode (over toilet) Pt Will Perform Toileting - Clothing Manipulation and hygiene: with supervision;sit to/from stand Additional ADL Goal #1: Pt will perform bed mobility with supervision using log roll technique. Additional ADL Goal #2: Pt will generalize back precautions in ADL independently.  OT Frequency: Min 2X/week   Barriers to D/C:            Co-evaluation              End of Session Equipment Utilized During Treatment: Rolling walker;Gait belt;Back brace Nurse Communication: Patient requests pain meds;Mobility status  Activity Tolerance: Patient limited by  pain Patient left: in bed;with call bell/phone within reach   Time: 0937-1004 OT Time Calculation (min): 27 min Charges:  OT General Charges $OT Visit: 1 Procedure OT Evaluation $OT Eval Moderate Complexity: 1 Procedure OT Treatments $Self Care/Home Management : 8-22 mins G-Codes:    Malka So 01/30/2016, 10:17 AM  [phone]

## 2016-01-30 NOTE — Progress Notes (Addendum)
Physical Therapy Treatment Patient Details Name: [name] MRN: [id number] DOB: January 12, 1956 Today's Date: 01/30/2016    History of Present Illness Patient is a 60 y/o female admitted with lumbar stenosis, now s/p L2-4 Posterior spinal fusion.    PT Comments    Pt performed increased mobility.  Able to advance gait distance and perform stair training x 5 stairs with B rails.  Pt remains guarded secondary to pain.  Pt required education on placement of brace and repositioning in standing position to improve fit and reduce pain.     Follow Up Recommendations  No PT follow up     Equipment Recommendations  None recommended by PT    Recommendations for Other Services       Precautions / Restrictions Precautions Precautions: Back Precaution Comments: Reviewed back precautions and brace application to improve fit.   Required Braces or Orthoses: Spinal Brace Spinal Brace: Lumbar corset;Applied in sitting position Restrictions Weight Bearing Restrictions: No    Mobility  Bed Mobility Overal bed mobility: Needs Assistance Bed Mobility: Sidelying to Sit;Sit to Sidelying Rolling: Min assist Sidelying to sit: Min assist     Sit to sidelying: Mod assist General bed mobility comments: Pt required assist rolling, advancing LEs on and off of bed and elevating trunk into seated position.    Transfers Overall transfer level: Needs assistance Equipment used: Rolling walker (2 wheeled) Transfers: Sit to/from Stand Sit to Stand: Min assist         General transfer comment: Cues to push from seated surface.  Pt guarded increased time and effort to complete.  min assist to boost into standing.    Ambulation/Gait Ambulation/Gait assistance: Min guard;Supervision Ambulation Distance (Feet): 150 Feet Assistive device: Rolling walker (2 wheeled) Gait Pattern/deviations: Step-through pattern;Decreased stride length;Trunk flexed;Wide base of support   Gait velocity interpretation:  Below normal speed for age/gender General Gait Details: maintained precautions throughout, cues for technique on turns, cues for upper trunk control and increasing B stride length.  Pt guarded with mobility.     Stairs Stairs: Yes  Min assist to min guard Stair Management: Two rails;Backwards;Forwards Number of Stairs: 5 General stair comments: Forward to ascend and backward to descend.  Cues for sequencing.    Wheelchair Mobility    Modified Rankin (Stroke Patients Only)       Balance Overall balance assessment: Needs assistance   Sitting balance-Leahy Scale: Good       Standing balance-Leahy Scale: Fair                      Cognition Arousal/Alertness: Awake/alert Behavior During Therapy: WFL for tasks assessed/performed Overall Cognitive Status: Within Functional Limits for tasks assessed                      Exercises      General Comments        Pertinent Vitals/Pain Pain Assessment: 0-10 Pain Score: 6  Pain Location: back Pain Descriptors / Indicators: Discomfort;Operative site guarding;Tightness Pain Intervention(s): Monitored during session;Patient requesting pain meds-RN notified    Home Living                      Prior Function            PT Goals (current goals can now be found in the care plan section) Acute Rehab PT Goals Patient Stated Goal: To return home Potential to Achieve Goals: Good Progress towards PT goals: Progressing toward goals  Frequency    Min 5X/week      PT Plan Current plan remains appropriate    Co-evaluation             End of Session Equipment Utilized During Treatment: Back brace;Gait belt Activity Tolerance: Patient tolerated treatment well;Patient limited by fatigue;Patient limited by pain Patient left: with call bell/phone within reach;in bed;with bed alarm set     Time: 1411-1439 PT Time Calculation (min) (ACUTE ONLY): 28 min  Charges:  $Gait Training: 8-22  mins $Therapeutic Activity: 8-22 mins                    G Codes:      [name] 02-04-16, 2:47 PM  [name], PTA pager [phone]

## 2016-01-30 NOTE — Progress Notes (Signed)
Patient ambulated in hallway with standby assist, brace on, and rolling walker approximately 60 yards. No complaints at this time. Continue to monitor patient.

## 2016-01-30 NOTE — Progress Notes (Signed)
PT Cancellation Note  Patient Details Name: [name] MRN: [id number] DOB: 11/30/1955   Cancelled Treatment:     Pt off unit to vascular lab will continue efforts as patient is available and medically ready.    Valbona Slabach Eli Hose 01/30/2016, 11:19 AM  [name], PTA pager [phone]

## 2016-01-30 NOTE — Progress Notes (Signed)
*  Preliminary Results* Bilateral lower extremity venous duplex completed. Bilateral lower extremities are negative for deep vein thrombosis. There is no evidence of Baker's cyst bilaterally.  01/30/2016 11:32 AM Maudry Mayhew, BS, RVT, RDCS, RDMS

## 2016-01-30 NOTE — Progress Notes (Signed)
    Subjective: Procedure(s) (LRB): POSTERIOR SPINAL FUSION ANTIBODY L2-4 (N/A) 1 Day Post-Op  Patient reports pain as 3 on 0-10 scale.  Reports decreased leg pain reports incisional back pain   Positive void Negative bowel movement Positive flatus Negative chest pain or shortness of breath  Objective: Vital signs in last 24 hours: Temp:  [97.8 F (36.6 C)-98.7 F (37.1 C)] 98.7 F (37.1 C) (12/15 0524) Pulse Rate:  [82-96] 96 (12/15 0524) Resp:  [16-18] 18 (12/15 0524) BP: (108-137)/(55-80) 117/61 (12/15 0524) SpO2:  [94 %-100 %] 100 % (12/15 0524)  Intake/Output from previous day: 12/14 0701 - 12/15 0700 In: 2990 [I.V.:2990] Out: 1350 [Urine:1200; Blood:150]  Labs: No results for input(s): WBC, RBC, HCT, PLT in the last 72 hours. No results for input(s): NA, K, CL, CO2, BUN, CREATININE, GLUCOSE, CALCIUM in the last 72 hours. No results for input(s): LABPT, INR in the last 72 hours.  Physical Exam: Neurologically intact ABD soft Intact pulses distally Incision: dressing C/D/I and no drainage Compartment soft  Assessment/Plan: Patient stable  xrays pending Continue mobilization with physical therapy Continue care  Advance diet Up with therapy Plan for discharge tomorrow or Sunday LE doppler pending Leaving to go out of town - my partner Dr Tonita Cong is aware and will manage any issues.   Patient is also aware.  Melina Schools, MD [place] [phone]

## 2016-01-30 NOTE — Progress Notes (Signed)
Patient foley d/c'd per order. Patient ambulated to bathroom with one assist and RW after mag citrate and enema given. Continue to monitor.

## 2016-01-31 NOTE — Progress Notes (Signed)
Physical Therapy Treatment Patient Details Name: [name] MRN: [id number] DOB: [birth year] Today's Date: 01/31/2016    History of Present Illness Patient is a 60 y/o female admitted with lumbar stenosis, now s/p L2-4 Posterior spinal fusion.    PT Comments    Continues to progress well. Ambulating slowly, but steadily and utilizing walker appropriately. Able to safely navigate stairs with close guard assist and her family will be able to support her at home.    Follow Up Recommendations  No PT follow up     Equipment Recommendations  None recommended by PT    Recommendations for Other Services       Precautions / Restrictions Precautions Precautions: Back Precaution Comments: Reviewed precautions Required Braces or Orthoses: Spinal Brace Spinal Brace: Lumbar corset;Applied in sitting position Restrictions Weight Bearing Restrictions: No    Mobility  Bed Mobility Overal bed mobility: Needs Assistance Bed Mobility: Rolling;Sidelying to Sit Rolling: Supervision Sidelying to sit: Supervision     Sit to sidelying: Supervision General bed mobility comments: Able to exit to Lt today with supervision, cues for safety, no physical assist. Cues for returning to sidelying and supine position from sitting.  Transfers Overall transfer level: Needs assistance Equipment used: Rolling walker (2 wheeled) Transfers: Sit to/from Stand Sit to Stand: Supervision Stand pivot transfers: Supervision       General transfer comment: Supervision for safety. Cues to push from bed rather than walker. Slow rise but stable once upright.  Ambulation/Gait Ambulation/Gait assistance: Supervision Ambulation Distance (Feet): 150 Feet Assistive device: Rolling walker (2 wheeled) Gait Pattern/deviations: Step-through pattern;Decreased stride length;Trunk flexed;Wide base of support Gait velocity: slow Gait velocity interpretation: <1.8 ft/sec, indicative of risk for recurrent  falls General Gait Details: Rigid and guarded with short stride length. Stable with rolling walker for support. No buckling noted. Cues for larger stride length and awareness of surroundings.   Stairs Stairs: Yes   Stair Management: Two rails;Step to pattern;Forwards Number of Stairs: 13 General stair comments: Able to safely navigate flight of steps using 2 rails similar to home with close guard assist. Both children are able to assist at home.  Wheelchair Mobility    Modified Rankin (Stroke Patients Only)       Balance                                    Cognition Arousal/Alertness: Awake/alert Behavior During Therapy: WFL for tasks assessed/performed Overall Cognitive Status: Within Functional Limits for tasks assessed                      Exercises General Exercises - Lower Extremity Ankle Circles/Pumps: AROM;Both;10 reps;Seated    General Comments General comments (skin integrity, edema, etc.): Dressing was rolled up and nearly off as pt returned to bed, RN notifited and replaced dressing. Reviewed precautions and brace application without difficulty      Pertinent Vitals/Pain Pain Assessment: 0-10 Pain Score: 6  Pain Location: back Pain Descriptors / Indicators: Throbbing Pain Intervention(s): Limited activity within patient's tolerance;Monitored during session;Repositioned    Home Living                      Prior Function            PT Goals (current goals can now be found in the care plan section) Acute Rehab PT Goals Patient Stated Goal: To return home PT Goal Formulation: With  patient Time For Goal Achievement: 02/03/16 Potential to Achieve Goals: Good Progress towards PT goals: Progressing toward goals    Frequency    Min 5X/week      PT Plan Current plan remains appropriate    Co-evaluation             End of Session Equipment Utilized During Treatment: Back brace;Gait belt Activity Tolerance:  Patient tolerated treatment well Patient left: in bed;with call bell/phone within reach;with bed alarm set;with SCD's reapplied     Time: 2524-7998 PT Time Calculation (min) (ACUTE ONLY): 37 min  Charges:  $Gait Training: 23-37 mins                    G Codes:      Ellouise Newer 22-Feb-2016, 3:40 PM Elayne Snare, PT

## 2016-01-31 NOTE — Progress Notes (Signed)
Orthopedics Progress Note  Subjective: Patient reports improving pain but still having difficulty mobilizing and worried about going home  Objective:  Vitals:   01/31/16 0532 01/31/16 0921  BP: 125/65 127/62  Pulse: (!) 106 (!) 105  Resp: 20 18  Temp: (!) 100.4 F (38 C) 100.3 F (37.9 C)    General: Awake and alert  Musculoskeletal: bilateral LE with 5/5 motor and sensation intact Neurovascularly intact  Lab Results  Component Value Date   WBC 7.3 01/20/2016   HGB 12.2 01/20/2016   HCT 37.6 01/20/2016   MCV 68.0 (L) 01/20/2016   PLT 279 01/20/2016       Component Value Date/Time   NA 141 01/20/2016 1047   K 3.7 01/20/2016 1047   CL 109 01/20/2016 1047   CO2 26 01/20/2016 1047   GLUCOSE 67 01/20/2016 1047   BUN 10 01/20/2016 1047   CREATININE 0.66 01/20/2016 1047   CREATININE 0.63 05/09/2015 1604   CALCIUM 9.3 01/20/2016 1047   GFRNONAA >60 01/20/2016 1047   GFRAA >60 01/20/2016 1047    Lab Results  Component Value Date   INR 1.06 01/03/2015    Assessment/Plan: s/p Procedure(s): POSTERIOR SPINAL FUSION ANTIBODY L2-4 XRAys from yesterday look good, improved alignment OOB with therapy Anticipate D/C tomorrow  Doran Heater. Veverly Fells, MD 01/31/2016 9:47 AM

## 2016-01-31 NOTE — Progress Notes (Signed)
Occupational Therapy Treatment Patient Details Name: [name] MRN: [id number] DOB: March 29, 1955 Today's Date: 01/31/2016    History of present illness Patient is a 60 y/o female admitted with lumbar stenosis, now s/p L2-4 Posterior spinal fusion.   OT comments  Pt. Moving well. Able to don brace, complete bed mobility oob, and toileting at S level.  Reports dtrs. Will be assisting with LB ADLS at home and will be sponge bathing.  Clear for d/c from acute OT.  Will alert OTR/L to sign off.   Follow Up Recommendations  No OT follow up    Equipment Recommendations  None recommended by OT    Recommendations for Other Services      Precautions / Restrictions Precautions Precautions: Back Required Braces or Orthoses: Spinal Brace Spinal Brace: Lumbar corset;Applied in sitting position Restrictions Weight Bearing Restrictions: No       Mobility Bed Mobility Overal bed mobility: Needs Assistance Bed Mobility: Rolling;Sidelying to Sit Rolling: Supervision Sidelying to sit: Supervision       General bed mobility comments: HOB flat, no rails, exits on R side.  has high bed.  adjusted height to match height of her bed at home.  advised to make sure she practices bed mobility back to bed at same height  Transfers Overall transfer level: Needs assistance Equipment used: Rolling walker (2 wheeled) Transfers: Sit to/from [facility] Sit to Stand: Supervision Stand pivot transfers: Supervision            Balance                                   ADL Overall ADL's : Needs assistance/impaired     Grooming: Supervision/safety;Standing Grooming Details (indicate cue type and reason): simulated in b.room       Lower Body Bathing Details (indicate cue type and reason): reports dtrs. will be assisting, declines need for A/E       Lower Body Dressing Details (indicate cue type and reason): reports dtrs. will be assisting, declines need for  A/E Toilet Transfer: Supervision/safety;BSC;Regular Toilet;RW Toilet Transfer Details (indicate cue type and reason): 3n1 over commode Toileting- Clothing Manipulation and Hygiene: Supervision/safety;Maximal assistance;Cueing for back precautions;Sit to/from stand Toileting - Clothing Manipulation Details (indicate cue type and reason): able to perform pericare in standing, required assistance to wipe buttocks, reports dtr.s to assist   Tub/Shower Transfer Details (indicate cue type and reason): pt plans to sponge bathe Functional mobility during ADLs: Supervision/safety;Rolling walker General ADL Comments: moving well.  good family support at home.  declines need for a/e.  no other acute OT needs at this time      Vision                     Perception     Praxis      Cognition                             Extremity/Trunk Assessment               Exercises     Shoulder Instructions       General Comments      Pertinent Vitals/ Pain       Pain Assessment: 0-10 Pain Score: 7  Pain Location: back Pain Descriptors / Indicators: Throbbing Pain Intervention(s): Monitored during session;Repositioned  Home Living  Prior Functioning/Environment              Frequency  Min 2X/week        Progress Toward Goals  OT Goals(current goals can now be found in the care plan section)  Progress towards OT goals: Progressing toward goals     Plan Discharge plan remains appropriate    Co-evaluation                 End of Session Equipment Utilized During Treatment: Rolling walker;Gait belt;Back brace   Activity Tolerance Patient tolerated treatment well   Patient Left in chair;with call bell/phone within reach   Nurse Communication          Time: 1130-1150 OT Time Calculation (min): 20 min  Charges: OT General Charges $OT Visit: 1 Procedure OT Treatments $Self  Care/Home Management : 8-22 mins  Janice Coffin, COTA/L 01/31/2016, 11:57 AM

## 2016-02-01 NOTE — Progress Notes (Signed)
Patient ID: [name], female   DOB: November 07, 1955, 60 y.o.   MRN: [id number] Subjective: 3 Days Post-Op Procedure(s) (LRB): POSTERIOR SPINAL FUSION ANTIBODY L2-4 (N/A)    Patient reports pain as mild to moderate depending on activity, no events, no significant complaints this am  Objective:   VITALS:   Vitals:   02/01/16 0143 02/01/16 0500  BP: (!) 101/48 117/63  Pulse: 97 96  Resp: 16 18  Temp: 98.9 F (37.2 C) 99.1 F (37.3 C)    Neurovascular intact Incision: dressing C/D/I, dressings have been changed by nursing  LABS No results for input(s): HGB, HCT, WBC, PLT in the last 72 hours.  No results for input(s): NA, K, BUN, CREATININE, GLUCOSE in the last 72 hours.  No results for input(s): LABPT, INR in the last 72 hours.   Assessment/Plan: 3 Days Post-Op Procedure(s) (LRB): POSTERIOR SPINAL FUSION ANTIBODY L2-4 (N/A)  Plan Home today RTC with Rolena Infante, MD 12/29

## 2016-02-01 NOTE — Progress Notes (Signed)
Patient is being d/c. Dc instructions given and patient verbalized understanding.

## 2016-02-02 NOTE — Op Note (Signed)
[name], STREET NO.:  [id number]  MEDICAL RECORD NO.:  [id number]  LOCATION:                                FACILITY:  [facility]  PHYSICIAN:  Charisa Twitty D. Rolena Infante, M.D. DATE OF BIRTH:  1955/03/12  DATE OF PROCEDURE:  01/29/2016 DATE OF DISCHARGE:  02/01/2016                              OPERATIVE REPORT   PREOPERATIVE DIAGNOSIS:  Degenerative lumbar scoliosis with spinal stenosis.  POSTOPERATIVE DIAGNOSIS:  Degenerative lumbar scoliosis with spinal stenosis.  OPERATIVE PROCEDURE:  Planned posterior pedicle screw fusion, L2-4.  COMPLICATIONS:  None.  FIRST ASSISTANT:  Ronette Deter, [place].  HISTORY:  This is a very pleasant 60 year old female who underwent an L2- 3, L3-4 XLIF yesterday without complications.  This morning the neuropathic leg pain had improved.  Her biggest complaint was back pain at the incision site.  As a result of the improvement in her neurological pain, I elected only to proceed with the instrumentation and not an open decompression.  The risks and benefits of the surgery were explained to the patient and consent was obtained.  OPERATIVE NOTE:  The patient was brought to the operating room and placed supine on the operating table.  After successful induction of general anesthesia and endotracheal intubation, the patient was turned prone onto the [facility] spine frame.  All bony prominences were well padded and the back was prepped and draped in a standard fashion.  Time- out was taken confirming the patient, procedure, and all the important pertinent data.  Fluoro view was brought into the field and identified the lateral border of the L2 pedicle.  I then made an incision and then advanced the Jamshidi needle down to the lateral aspect of the pedicle.  Once I was there, I attached the Jamshidi needle to the neuromonitoring device and advanced the Jamshidi needle using fluoro and real-time EMG monitoring. Once I was nearing the medial wall of  the pedicle in the AP view, I switched to the lateral to confirm that I was on the posterior margin of the vertebral body.  Having confirmed this, I advanced the Jamshidi needle into the vertebral body and then placed a guidepin.  I repeated this procedure at L3 and L4 and on the contralateral side.  Once all 6 pedicles were cannulated, I then tapped over each guidepin and then placed pedicle screws.  On the right-hand side, I used a [employer] MIS 45 mm length 6.5 diameter pedicle screws, on the left side at L2 and L3, I placed the 40 mm length screws 6.5 diameter and at L4, I placed a 45 mm length 6.5 diameter screw.  All screws were then directly stimulated and there was no adverse activity at 40 milliamps of stimulation.  I then inserted the rods and distracted the right hand side and compressed the left.  All locking caps were then inserted and locked into position and torqued off according to manufacture's standards.  I then removed the inserting blades from the screws and irrigated the wounds copiously with normal saline.  I then closed them in a layered fashion with interrupted #1 Vicryl suture, 2-0 Vicryl suture and a 3-0 Monocryl.  Steri-Strips and dry dressings were  applied and the patient was extubated, transferred to the PACU without incident.  At the end of the case, all needle and sponge counts were correct.  There were no adverse intraoperative events.     Tynisha Ogan D. Rolena Infante, M.D.     DDB/MEDQ  D:  01/29/2016  T:  01/30/2016  Job:  [id number]

## 2016-02-02 NOTE — Anesthesia Postprocedure Evaluation (Signed)
Anesthesia Post Note  Patient: [name]  Procedure(s) Performed: Procedure(s) (LRB): ANTERIOR LATERAL LUMBAR FUSION 2 LEVELS XLIF L2-4 (N/A)  Patient location during evaluation: PACU Anesthesia Type: General Level of consciousness: awake and alert Pain management: pain level controlled Vital Signs Assessment: post-procedure vital signs reviewed and stable Respiratory status: spontaneous breathing, nonlabored ventilation, respiratory function stable and patient connected to nasal cannula oxygen Cardiovascular status: stable Postop Assessment: no signs of nausea or vomiting Anesthetic complications: no    Last Vitals:  Vitals:   02/01/16 0143 02/01/16 0500  BP: (!) 101/48 117/63  Pulse: 97 96  Resp: 16 18  Temp: 37.2 C 37.3 C    Last Pain:  Vitals:   02/01/16 0833  TempSrc:   PainSc: 0-No pain                 [name]

## 2016-03-26 ENCOUNTER — Encounter (HOSPITAL_BASED_OUTPATIENT_CLINIC_OR_DEPARTMENT_OTHER): Payer: BLUE CROSS/BLUE SHIELD | Attending: Internal Medicine

## 2016-03-26 DIAGNOSIS — Z981 Arthrodesis status: Secondary | ICD-10-CM | POA: Insufficient documentation

## 2016-03-26 DIAGNOSIS — Z872 Personal history of diseases of the skin and subcutaneous tissue: Secondary | ICD-10-CM | POA: Diagnosis not present

## 2016-03-26 DIAGNOSIS — Z09 Encounter for follow-up examination after completed treatment for conditions other than malignant neoplasm: Secondary | ICD-10-CM | POA: Diagnosis not present

## 2016-04-03 ENCOUNTER — Encounter (HOSPITAL_BASED_OUTPATIENT_CLINIC_OR_DEPARTMENT_OTHER): Payer: Self-pay | Admitting: Emergency Medicine

## 2016-04-03 ENCOUNTER — Emergency Department (HOSPITAL_BASED_OUTPATIENT_CLINIC_OR_DEPARTMENT_OTHER)
Admission: EM | Admit: 2016-04-03 | Discharge: 2016-04-03 | Disposition: A | Discharge status: Home / Self Care | Payer: BLUE CROSS/BLUE SHIELD | Attending: Emergency Medicine | Admitting: Emergency Medicine

## 2016-04-03 DIAGNOSIS — M791 Myalgia: Secondary | ICD-10-CM | POA: Insufficient documentation

## 2016-04-03 DIAGNOSIS — Z79899 Other long term (current) drug therapy: Secondary | ICD-10-CM | POA: Insufficient documentation

## 2016-04-03 DIAGNOSIS — J45909 Unspecified asthma, uncomplicated: Secondary | ICD-10-CM | POA: Insufficient documentation

## 2016-04-03 DIAGNOSIS — M79662 Pain in left lower leg: Secondary | ICD-10-CM | POA: Diagnosis not present

## 2016-04-03 DIAGNOSIS — M79605 Pain in left leg: Secondary | ICD-10-CM

## 2016-04-03 NOTE — ED Triage Notes (Signed)
Pt reports sitting down into a chair 30 min ago and feeling a sharp pain in her L thigh which has not subsided.

## 2016-04-03 NOTE — ED Provider Notes (Signed)
[place] [facility]PT [facility] Provider Note   CSN: [id number] Arrival date & time: 04/03/16  1024     History   Chief Complaint Chief Complaint  Patient presents with  . Leg Pain    HPI [name] is a 61 y.o. female.  Patient presents to emergency department with chief complaint of left leg pain. She states that she sat down in a chair and experienced a sharp stabbing sensation in her left thigh. The pain does not radiate. She states that it feels better now. It is still somewhat reproducible with palpation. She denies any traumatic injuries. She denies any rash, fever, or swelling. She is able to ambulate. She has not tried taking anything for symptoms area she denies any radiating pain from her back. She states that she is concerned about DVT because she recently had back surgery in December.   The history is provided by the patient. No language interpreter was used.    Past Medical History:  Diagnosis Date  . Arthritis   . GERD (gastroesophageal reflux disease)   . Microcytic anemia   . Migraine   . Scoliosis   . UC (ulcerative colitis) ([place]) 06/2012   Colonoscopy with Dr. Hilarie Fredrickson    Patient Active Problem List   Diagnosis Date Noted  . Back pain 01/28/2016  . S/P left TKA 01/14/2015  . S/P knee replacement 01/14/2015  . Thoracic back pain 04/05/2014  . Beta thalassemia, heterozygous 03/11/2014  . Right lumbar radiculopathy 11/23/2013  . Arm numbness 07/16/2013  . Right flank pain 12/20/2012  . Drowsiness 09/01/2012  . 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 Routine general medical examination at a health care facility 05/19/2012  . Sebaceous cyst 05/19/2012  . THYROIDITIS 01/27/2010  . MIGRAINE HEADACHE 01/27/2010  . ALLERGIC RHINITIS 12/31/2009  . ASTHMA 12/31/2009  . PALPITATIONS 12/31/2009  . SHORTNESS OF BREATH 12/31/2009    Past Surgical History:  Procedure Laterality Date  . ABDOMINAL HYSTERECTOMY     partial  . ANTERIOR LAT LUMBAR FUSION  N/A 01/28/2016   Procedure: ANTERIOR LATERAL LUMBAR FUSION 2 LEVELS XLIF L2-4;  Surgeon: Melina Schools, MD;  Location: [place];  Service: Orthopedics;  Laterality: N/A;  . BACK SURGERY    . BREAST SURGERY Bilateral    cysts 61 yrs old  . COLONOSCOPY  20 + years ago   In [place].  . KNEE SURGERY Left 12-13,11-10   meniscal repairs  . LUMBAR FUSION  01/28/2016   ANTERIOR LATERAL LUMBAR FUSION 2 LEVELS XLIF L2-4 (N/A)  . TOTAL KNEE ARTHROPLASTY Left 01/14/2015   Procedure: TOTAL KNEE ARTHROPLASTY;  Surgeon: Paralee Cancel, MD;  Location: [facility];  Service: Orthopedics;  Laterality: Left;    OB History    No data available       Home Medications    Prior to Admission medications   Medication Sig Start Date End Date Taking? Authorizing Provider  DUEXIS 800-26.6 MG TABS Take 1 tablet by mouth 2 (two) times daily as needed (for pain/inflammation).  12/15/15  Yes Historical Provider, MD  rizatriptan (MAXALT) 10 MG tablet Take 1 tablet (10 mg total) by mouth as needed for migraine. May repeat in 2 hours if needed 05/09/15  Yes Midge Minium, MD  mesalamine (LIALDA) 1.2 g EC tablet TAKE 2 TABLETS DAILY 10/27/15   Jerene Bears, MD  ondansetron (ZOFRAN ODT) 4 MG disintegrating tablet Take 1 tablet (4 mg total) by mouth every 8 (eight) hours as needed for nausea or vomiting. 01/29/16  Darla Lesches Mayo, PA-C    Family History Family History  Problem Relation Age of Onset  . Sickle cell trait Mother   . Diabetes Brother   . Diabetes Daughter   . Asthma    . Colon cancer Neg Hx     Social History Social History  Substance Use Topics  . Smoking status: Never Smoker  . Smokeless tobacco: Never Used  . Alcohol use No     Allergies   No known allergies   Review of Systems Review of Systems  Constitutional: Negative for chills and fever.  HENT: Negative for ear pain and sore throat.   Eyes: Negative for pain and visual disturbance.  Respiratory: Negative for cough  and shortness of breath.   Cardiovascular: Negative for chest pain and palpitations.  Gastrointestinal: Negative for abdominal pain and vomiting.  Genitourinary: Negative for dysuria and hematuria.  Musculoskeletal: Positive for myalgias. Negative for arthralgias and back pain.  Skin: Negative for color change and rash.  Neurological: Negative for seizures and syncope.  All other systems reviewed and are negative.    Physical Exam Updated Vital Signs BP 134/76 (BP Location: Right Arm)   Pulse 81   Temp 97.8 F (36.6 C) (Oral)   Resp 18   Ht 5' 3"  (1.6 m)   Wt 90.7 kg   SpO2 97%   BMI 35.43 kg/m   Physical Exam Nursing note and vitals reviewed.  Constitutional: Pt appears well-developed and well-nourished. No distress.  HENT:  Head: Normocephalic and atraumatic.  Eyes: Conjunctivae are normal.  Neck: Normal range of motion.  Cardiovascular: Normal rate, regular rhythm. Intact distal pulses.   Capillary refill < 3 sec.  Pulmonary/Chest: Effort normal and breath sounds normal.  Musculoskeletal:  Left lower extremity Pt exhibits mild to moderate tenderness to palpation over the left quadriceps, no bony abnormality or deformity, patient is ambulatory no contusions, no visible changes in the muscle body.  No calf tenderness, no new leg swelling, no popliteal tenderness, doubt DVT ROM: 5/5  Strength: 5/5  Neurological: Pt  is alert. Coordination normal.  Sensation: /5  Skin: Skin is warm and dry. Pt is not diaphoretic.  No evidence of open wound or skin tenting Psychiatric: Pt has a normal mood and affect.     ED Treatments / Results  Labs (all labs ordered are listed, but only abnormal results are displayed) Labs Reviewed - No data to display  EKG  EKG Interpretation None       Radiology No results found.  Procedures Procedures (including critical care time)  Medications Ordered in ED Medications - No data to display   Initial Impression / Assessment  and Plan / ED Course  I have reviewed the triage vital signs and the nursing notes.  Pertinent labs & imaging results that were available during my care of the patient were reviewed by me and considered in my medical decision making (see chart for details).     Patient with left leg pain. No longer experiencing symptoms now unless palpated. Unclear etiology. No rashes or abscess. Doubt shingles. No traumatic injury, doubt fracture. No radiating pain, doubt lumbar radiculopathy/sciatica.  Patient is ambulatory. There is no calf tenderness or popliteal tenderness, no evidence of DVT.  Given the mechanism, that the patient experienced sharp pain while sitting on my question whether she has a muscle strain or spasm. She does have some mild tenderness to palpation over the muscle body. Doubt utility of x-ray given the patient is ambulatory.  Recommend compression with an Ace wrap, ice, heat, and NSAIDs. Primary care/ports medicine follow-up next week if symptoms do not improve.  Final Clinical Impressions(s) / ED Diagnoses   Final diagnoses:  Pain of left lower extremity    New Prescriptions New Prescriptions   No medications on file     Montine Circle, PA-C 04/03/16 [place], MD 04/04/16 [phone]

## 2016-07-14 ENCOUNTER — Telehealth: Payer: Self-pay | Admitting: Internal Medicine

## 2016-07-14 MED ORDER — MESALAMINE 1.2 G PO TBEC: 2.4000 g | DELAYED_RELEASE_TABLET | Freq: Every day | ORAL | 1 refills | Status: DC

## 2016-07-14 NOTE — Telephone Encounter (Signed)
Left message for pt to call back. Requested that she call back with her symptoms as she has not been seen since 2016 and has not had a refill on med since Sept 2017.  Pt called back and states she had decreased to taking 1 lialda daily because she was running out of medication. Since decreasing to 1 daily she had noticed that her stools are getting loose. Pt has scheduled an OV with Dr. Hilarie Fredrickson in July. Refill sent to pharmacy for pt until her scheduled OV. Pt aware.

## 2016-07-14 NOTE — Telephone Encounter (Signed)
Patient not seen in office since 2016. No refills on meds since 2017. Looks like this is going to be triage.

## 2016-08-06 ENCOUNTER — Telehealth: Payer: Self-pay | Admitting: Family Medicine

## 2016-08-06 MED ORDER — RIZATRIPTAN BENZOATE 10 MG PO TABS
10.0000 mg | ORAL_TABLET | ORAL | 3 refills | Status: DC | PRN
Start: 2016-08-06 — End: 2016-11-08

## 2016-08-06 NOTE — Telephone Encounter (Signed)
Pt asking for refill on maxalt, [facility] on [address] in [place], pt states that she is not able to come in for an appt at this time due to having back surgery in Dec.

## 2016-08-06 NOTE — Telephone Encounter (Signed)
Pt has been scheduled for Sept.

## 2016-08-06 NOTE — Telephone Encounter (Signed)
Medication was filled. Please have pt schedule a physical. That way she is not missing it this year.

## 2016-09-01 ENCOUNTER — Encounter: Payer: Self-pay | Admitting: Internal Medicine

## 2016-09-01 ENCOUNTER — Ambulatory Visit (INDEPENDENT_AMBULATORY_CARE_PROVIDER_SITE_OTHER): Payer: BLUE CROSS/BLUE SHIELD | Admitting: Internal Medicine

## 2016-09-01 VITALS — BP 120/78 | Ht 61.25 in | Wt 221.0 lb

## 2016-09-01 DIAGNOSIS — K219 Gastro-esophageal reflux disease without esophagitis: Secondary | ICD-10-CM

## 2016-09-01 DIAGNOSIS — K5100 Ulcerative (chronic) pancolitis without complications: Secondary | ICD-10-CM

## 2016-09-01 MED ORDER — MESALAMINE 1.2 G PO TBEC: 4.8000 g | DELAYED_RELEASE_TABLET | Freq: Every day | ORAL | 1 refills | Status: DC

## 2016-09-01 MED ORDER — SUPREP BOWEL PREP KIT 17.5-3.13-1.6 GM/177ML PO SOLN: ORAL | 0 refills | Status: DC

## 2016-09-01 MED ORDER — PANTOPRAZOLE SODIUM 40 MG PO TBEC
40.0000 mg | DELAYED_RELEASE_TABLET | Freq: Every day | ORAL | 1 refills | Status: DC
Start: 2016-09-01 — End: 2017-03-16

## 2016-09-01 NOTE — Patient Instructions (Addendum)
You have been scheduled for a colonoscopy. Please follow written instructions given to you at your visit today.  Please pick up your prep supplies at the pharmacy within the next 1-3 days. If you use inhalers (even only as needed), please bring them with you on the day of your procedure. Your physician has requested that you go to www.startemmi.com and enter the access code given to you at your visit today. This web site gives a general overview about your procedure. However, you should still follow specific instructions given to you by our office regarding your preparation for the procedure.   We have sent the following medications to your pharmacy for you to pick up at your convenience: Lialda (increased to 4.8g from 2.4g) Pantoprazole  Please call our office or email [place] through [place] in 1 to 2 months to let us know how you are progressing in terms of bowel consistency and frequency. If you are age 18 or older, your body mass index should be between 23-30. Your Body mass index is 41.42 kg/m. If this is out of the aforementioned range listed, please consider follow up with your Primary Care Provider.  If you are age 59 or younger, your body mass index should be between 19-25. Your Body mass index is 41.42 kg/m. If this is out of the aformentioned range listed, please consider follow up with your Primary Care Provider.   Please follow up in 1 year.

## 2016-09-01 NOTE — Progress Notes (Signed)
   Subjective:    Patient ID: [name], female    DOB: 07/25/1955, 61 y.o.   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 She is here alone today and was last seen at the time of her colonoscopy on 10/04/2014. She's been maintained on mesalamine 2.4 g daily.  She reports that she is still having some loose stools with very occasional blood. Stools occur 2-3 times per day. When she was running out of mesalamine she was having more issues with lower abdominal crampy discomfort and more frequent, looser stools. No hematochezia or melena. She has been having more issues with heartburn, regurgitation and belching. She's used Tums with only slight improvement. Several occasions at night she's workup with coughing and feeling like she was choking.  Last colonoscopy was 2 years ago which showed quiescent colitis.   Review of Systems   As per history of present illness, otherwise negative  Current Medications, Allergies, Past Medical History, Past Surgical History, Family History and Social History were reviewed in [employer] record.    Objective:   Physical Exam BP 120/78 (BP Location: Left Arm, Patient Position: Sitting, Cuff Size: Large)   Ht 5' 1.25" (1.556 m) Comment: without shoes  Wt 221 lb (100.2 kg)   BMI 41.42 kg/m  Constitutional: Well-developed and well-nourished. No distress. HEENT: Normocephalic and atraumatic. Oropharynx is clear and moist. Conjunctivae are normal.  No scleral icterus. Neck: Neck supple. Trachea midline. Cardiovascular: Normal rate, regular rhythm and intact distal pulses. No M/R/G Pulmonary/chest: Effort normal and breath sounds normal. No wheezing, rales or rhonchi. Abdominal: Soft, nontender, nondistended. Bowel sounds active throughout. There are no masses palpable. No  hepatosplenomegaly. Extremities: no clubbing, cyanosis, or edema Neurological: Alert and oriented to person place and time. Skin: Skin is warm and dry. Psychiatric: Normal mood and affect. Behavior is normal.      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  1. Long-standing, pan ulcerative colitis -- due to the fact that her stools are still loose and slightly more frequent I will increase Lialda to 4.8 g daily 1-2 months. I've asked that she determine if this improves her bowel consistency and bowel frequency. If not we can return to 2.4 g daily. She is due surveillance colonoscopy this year for long-standing IBD. We discussed the risks, benefits and alternatives she is agreeable and wishes to proceed.  2. GERD -- symptoms consistent with acid reflux disease. Trial of pantoprazole 40 mg daily 1-2 months. Stop therapy after 2 months to determine if symptoms return and the need for more longer term treatment.  25 minutes spent with the patient today. Greater than 50% was spent in counseling and coordination of care with the patient

## 2016-10-07 ENCOUNTER — Encounter: Payer: Self-pay | Admitting: Internal Medicine

## 2016-10-21 ENCOUNTER — Encounter: Payer: Self-pay | Admitting: Internal Medicine

## 2016-10-21 ENCOUNTER — Ambulatory Visit (AMBULATORY_SURGERY_CENTER): Payer: BLUE CROSS/BLUE SHIELD | Admitting: Internal Medicine

## 2016-10-21 VITALS — BP 146/99 | HR 82 | Temp 98.2°F | Resp 20 | Ht 61.0 in | Wt 221.0 lb

## 2016-10-21 DIAGNOSIS — K5190 Ulcerative colitis, unspecified, without complications: Secondary | ICD-10-CM | POA: Diagnosis not present

## 2016-10-21 DIAGNOSIS — K5100 Ulcerative (chronic) pancolitis without complications: Secondary | ICD-10-CM | POA: Diagnosis not present

## 2016-10-21 MED ORDER — SODIUM CHLORIDE 0.9 % IV SOLN: 500.0000 mL | INTRAVENOUS | Status: DC

## 2016-10-21 NOTE — Progress Notes (Signed)
Pt's states no medical or surgical changes since previsit or office visit. 

## 2016-10-21 NOTE — Op Note (Signed)
[place] Patient Name: [name] Procedure Date: 10/21/2016 3:09 PM MRN: [id number] Endoscopist: Jerene Bears , MD Age: 61 Referring MD:  Date of Birth: 03-29-1955 Gender: Female Account #: [id number] Procedure:                Colonoscopy Indications:              High risk colon cancer surveillance: Ulcerative                            pancolitis of 8 (or more) years duration, Last                            colonoscopy: August 2016 Medicines:                Monitored Anesthesia Care Procedure:                Pre-Anesthesia Assessment:                           - Prior to the procedure, a History and Physical                            was performed, and patient medications and                            allergies were reviewed. The patient's tolerance of                            previous anesthesia was also reviewed. The risks                            and benefits of the procedure and the sedation                            options and risks were discussed with the patient.                            All questions were answered, and informed consent                            was obtained. Prior Anticoagulants: The patient has                            taken no previous anticoagulant or antiplatelet                            agents. ASA Grade Assessment: II - A patient with                            mild systemic disease. After reviewing the risks                            and benefits, the patient was deemed in  satisfactory condition to undergo the procedure.                           After obtaining informed consent, the colonoscope                            was passed under direct vision. Throughout the                            procedure, the patient's blood pressure, pulse, and                            oxygen saturations were monitored continuously. The                            Model PCF-H190DL [id number]) scope was  introduced                            through the anus and advanced to the the cecum,                            identified by appendiceal orifice and ileocecal                            valve. The colonoscopy was performed without                            difficulty. The patient tolerated the procedure                            well. The quality of the bowel preparation was                            good. The ileocecal valve, appendiceal orifice, and                            rectum were photographed. Scope In: 3:23:24 PM Scope Out: 3:37:17 PM Scope Withdrawal Time: 0 hours 12 minutes 8 seconds  Total Procedure Duration: 0 hours 13 minutes 53 seconds  Findings:                 The digital rectal exam was normal.                           Inflammation characterized by very mild and                            scattered small erosions, erythema and granularity                            was found throughout the colon from rectum to                            cecum. This was mild in severity. Four biopsies  were taken every 10 cm with a cold forceps from the                            entire colon for ulcerative colitis surveillance.                            These biopsy specimens from the right colon and                            left colon were sent to Pathology.                           The exam was otherwise without abnormality on                            direct and retroflexion views. No polypoid lesions                            were seen. Complications:            No immediate complications. Estimated Blood Loss:     Estimated blood loss was minimal. Impression:               - Pancolitis ulcerative colitis. Scattered mild                            inflammation. Biopsied.                           - The examination was otherwise normal on direct                            and retroflexion views. Recommendation:           - Patient has a  contact number available for                            emergencies. The signs and symptoms of potential                            delayed complications were discussed with the                            patient. Return to normal activities tomorrow.                            Written discharge instructions were provided to the                            patient.                           - Resume previous diet.                           - Continue present medications including Lialda 2.4  g daily.                           - Await pathology results.                           - Repeat colonoscopy in 2 years for surveillance. Jerene Bears, MD 10/21/2016 3:43:37 PM This report has been signed electronically.

## 2016-10-21 NOTE — Patient Instructions (Signed)
YOU HAD AN ENDOSCOPIC PROCEDURE TODAY AT [facility]:   Refer to the procedure report that was given to you for any specific questions about what was found during the examination.  If the procedure report does not answer your questions, please call your gastroenterologist to clarify.  If you requested that your care partner not be given the details of your procedure findings, then the procedure report has been included in a sealed envelope for you to review at your convenience later.  YOU SHOULD EXPECT: Some feelings of bloating in the abdomen. Passage of more gas than usual.  Walking can help get rid of the air that was put into your GI tract during the procedure and reduce the bloating. If you had a lower endoscopy (such as a colonoscopy or flexible sigmoidoscopy) you may notice spotting of blood in your stool or on the toilet paper. If you underwent a bowel prep for your procedure, you may not have a normal bowel movement for a few days.  Please Note:  You might notice some irritation and congestion in your nose or some drainage.  This is from the oxygen used during your procedure.  There is no need for concern and it should clear up in a day or so.  SYMPTOMS TO REPORT IMMEDIATELY:   Following lower endoscopy (colonoscopy or flexible sigmoidoscopy):  Excessive amounts of blood in the stool  Significant tenderness or worsening of abdominal pains  Swelling of the abdomen that is new, acute  Fever of 100F or higher   For urgent or emergent issues, a gastroenterologist can be reached at any hour by calling [phone].   DIET:  We do recommend a small meal at first, but then you may proceed to your regular diet.  Drink plenty of fluids but you should avoid alcoholic beverages for 24 hours.  ACTIVITY:  You should plan to take it easy for the rest of today and you should NOT DRIVE or use heavy machinery until tomorrow (because of the sedation medicines used during the test).     FOLLOW UP: Our staff will call the number listed on your records the next business day following your procedure to check on you and address any questions or concerns that you may have regarding the information given to you following your procedure. If we do not reach you, we will leave a message.  However, if you are feeling well and you are not experiencing any problems, there is no need to return our call.  We will assume that you have returned to your regular daily activities without incident.  If any biopsies were taken you will be contacted by phone or by letter within the next 1-3 weeks.  Please call us at [phone] if you have not heard about the biopsies in 3 weeks.   SIGNATURES/CONFIDENTIALITY: You and/or your care partner have signed paperwork which will be entered into your electronic medical record.  These signatures attest to the fact that that the information above on your After Visit Summary has been reviewed and is understood.  Full responsibility of the confidentiality of this discharge information lies with you and/or your care-partner.  Continue present meds including Lialda 2.4gms. Daily.  Repeat colonoscopy 2 years for surveillance-2020

## 2016-10-21 NOTE — Progress Notes (Signed)
A/ox3 pleased with MAC, report to Jane RN 

## 2016-10-21 NOTE — Progress Notes (Signed)
Called to room to assist during endoscopic procedure.  Patient ID and intended procedure confirmed with present staff. Received instructions for my participation in the procedure from the performing physician.  

## 2016-10-22 ENCOUNTER — Telehealth: Payer: Self-pay | Admitting: *Deleted

## 2016-10-22 NOTE — Telephone Encounter (Signed)
  Follow up Call-  Call back number 10/21/2016 10/04/2014  Post procedure Call Back phone  # [phone]  Permission to leave phone message Yes Yes  Some recent data might be hidden     Patient questions:  Do you have a fever, pain , or abdominal swelling? No. Pain Score  0 *  Have you tolerated food without any problems? Yes.    Have you been able to return to your normal activities? Yes.    Do you have any questions about your discharge instructions: Diet   No. Medications  No. Follow up visit  No.  Do you have questions or concerns about your Care? No.  Actions: * If pain score is 4 or above: No action needed, pain <4.

## 2016-10-26 ENCOUNTER — Encounter: Payer: Self-pay | Admitting: Internal Medicine

## 2016-11-08 ENCOUNTER — Ambulatory Visit (INDEPENDENT_AMBULATORY_CARE_PROVIDER_SITE_OTHER): Payer: BLUE CROSS/BLUE SHIELD | Admitting: Family Medicine

## 2016-11-08 ENCOUNTER — Encounter: Payer: Self-pay | Admitting: Family Medicine

## 2016-11-08 VITALS — BP 126/86 | HR 89 | Temp 98.7°F | Resp 17 | Ht 61.0 in | Wt 222.2 lb

## 2016-11-08 DIAGNOSIS — Z0000 Encounter for general adult medical examination without abnormal findings: Secondary | ICD-10-CM | POA: Diagnosis not present

## 2016-11-08 DIAGNOSIS — E559 Vitamin D deficiency, unspecified: Secondary | ICD-10-CM

## 2016-11-08 DIAGNOSIS — E669 Obesity, unspecified: Secondary | ICD-10-CM | POA: Insufficient documentation

## 2016-11-08 MED ORDER — RIZATRIPTAN BENZOATE 10 MG PO TABS: 10.0000 mg | ORAL_TABLET | ORAL | 3 refills | Status: DC | PRN

## 2016-11-08 NOTE — Progress Notes (Signed)
   Subjective:    Patient ID: [name], female    DOB: 13-Nov-1955, 61 y.o.   MRN: [id number]  HPI CPE- UTD on colonoscopy, mammo.  No need for pap due to hysterectomy.     Review of Systems Patient reports no vision/ hearing changes, adenopathy,fever, weight change,  persistant/recurrent hoarseness , swallowing issues, chest pain, palpitations, edema, persistant/recurrent cough, hemoptysis, dyspnea (rest/exertional/paroxysmal nocturnal), gastrointestinal bleeding (melena, rectal bleeding), abdominal pain, significant heartburn, bowel changes, GU symptoms (dysuria, hematuria, incontinence), Gyn symptoms (abnormal  bleeding, pain),  syncope, focal weakness, memory loss, numbness & tingling, skin/hair/nail changes, abnormal bruising or bleeding, anxiety, or depression.     Objective:   Physical Exam General Appearance:    Alert, cooperative, no distress, appears stated age, morbidly obese  Head:    Normocephalic, without obvious abnormality, atraumatic  Eyes:    PERRL, conjunctiva/corneas clear, EOM's intact, fundi    benign, both eyes  Ears:    Normal TM's and external ear canals, both ears  Nose:   Nares normal, septum midline, mucosa normal, no drainage    or sinus tenderness  Throat:   Lips, mucosa, and tongue normal; teeth and gums normal  Neck:   Supple, symmetrical, trachea midline, no adenopathy;    Thyroid: no enlargement/tenderness/nodules  Back:     Symmetric, no curvature, ROM normal, no CVA tenderness  Lungs:     Clear to auscultation bilaterally, respirations unlabored  Chest Wall:    No tenderness or deformity   Heart:    Regular rate and rhythm, S1 and S2 normal, no murmur, rub   or gallop  Breast Exam:    Deferred to mammo  Abdomen:     Soft, non-tender, bowel sounds active all four quadrants,    no masses, no organomegaly  Genitalia:    Deferred  Rectal:    Extremities:   Extremities normal, atraumatic, no cyanosis or edema  Pulses:   2+ and symmetric all  extremities  Skin:   Skin color, texture, turgor normal, no rashes or lesions  Lymph nodes:   Cervical, supraclavicular, and axillary nodes normal  Neurologic:   CNII-XII intact, normal strength, sensation and reflexes    throughout          Assessment & Plan:

## 2016-11-08 NOTE — Assessment & Plan Note (Signed)
Pt's PE WNL w/ exception of obesity.  UTD on mammo, colonoscopy.  Declines flu shot.  Check labs.  Anticipatory guidance provided.

## 2016-11-08 NOTE — Progress Notes (Signed)
Pre visit review using our clinic review tool, if applicable. No additional management support is needed unless otherwise documented below in the visit note. 

## 2016-11-08 NOTE — Assessment & Plan Note (Signed)
Check labs.  Replete prn. 

## 2016-11-08 NOTE — Patient Instructions (Signed)
Follow up in 6 months to recheck weight loss progress We'll notify you of your lab results and make any changes if needed Continue to work on healthy diet and regular exercise- you can do it!! Call and schedule your mammo when you get your reminder letter Call with any questions or concerns Happy Fall!!!

## 2016-11-09 ENCOUNTER — Other Ambulatory Visit: Payer: Self-pay | Admitting: General Practice

## 2016-11-09 LAB — CBC WITH DIFFERENTIAL/PLATELET
BASOS ABS: 39 {cells}/uL (ref 0–200)
Basophils Relative: 0.5 %
EOS ABS: 408 {cells}/uL (ref 15–500)
Eosinophils Relative: 5.3 %
HEMATOCRIT: 39.7 % (ref 35.0–45.0)
Hemoglobin: 12.4 g/dL (ref 11.7–15.5)
Lymphs Abs: 2649 cells/uL (ref 850–3900)
MCH: 21.5 pg — AB (ref 27.0–33.0)
MCHC: 31.2 g/dL — AB (ref 32.0–36.0)
MCV: 68.7 fL — AB (ref 80.0–100.0)
MONOS PCT: 6.6 %
MPV: 9.1 fL (ref 7.5–12.5)
NEUTROS PCT: 53.2 %
Neutro Abs: 4096 cells/uL (ref 1500–7800)
PLATELETS: 320 10*3/uL (ref 140–400)
RBC: 5.78 10*6/uL — ABNORMAL HIGH (ref 3.80–5.10)
RDW: 15.4 % — AB (ref 11.0–15.0)
TOTAL LYMPHOCYTE: 34.4 %
WBC mixed population: 508 cells/uL (ref 200–950)
WBC: 7.7 10*3/uL (ref 3.8–10.8)

## 2016-11-09 LAB — HEPATIC FUNCTION PANEL
AG Ratio: 1.2 (calc) (ref 1.0–2.5)
ALKALINE PHOSPHATASE (APISO): 75 U/L (ref 33–130)
ALT: 16 U/L (ref 6–29)
AST: 21 U/L (ref 10–35)
Albumin: 4.1 g/dL (ref 3.6–5.1)
BILIRUBIN INDIRECT: 0.5 mg/dL (ref 0.2–1.2)
BILIRUBIN TOTAL: 0.6 mg/dL (ref 0.2–1.2)
Bilirubin, Direct: 0.1 mg/dL (ref 0.0–0.2)
Globulin: 3.4 g/dL (calc) (ref 1.9–3.7)
Total Protein: 7.5 g/dL (ref 6.1–8.1)

## 2016-11-09 LAB — BASIC METABOLIC PANEL
BUN: 10 mg/dL (ref 7–25)
CHLORIDE: 106 mmol/L (ref 98–110)
CO2: 23 mmol/L (ref 20–32)
Calcium: 9.9 mg/dL (ref 8.6–10.4)
Creat: 0.67 mg/dL (ref 0.50–1.05)
Glucose, Bld: 79 mg/dL (ref 65–99)
POTASSIUM: 4.3 mmol/L (ref 3.5–5.3)
Sodium: 144 mmol/L (ref 135–146)

## 2016-11-09 LAB — LIPID PANEL
Cholesterol: 186 mg/dL (ref <200)
HDL: 72 mg/dL (ref >50)
LDL Cholesterol (Calc): 96 mg/dL (calc)
NON-HDL CHOLESTEROL (CALC): 114 mg/dL (ref <130)
TRIGLYCERIDES: 87 mg/dL (ref <150)
Total CHOL/HDL Ratio: 2.6 (calc) (ref <5.0)

## 2016-11-09 LAB — VITAMIN D 25 HYDROXY (VIT D DEFICIENCY, FRACTURES): Vit D, 25-Hydroxy: 15 ng/mL — ABNORMAL LOW (ref 30–100)

## 2016-11-09 LAB — TSH: TSH: 0.69 mIU/L (ref 0.40–4.50)

## 2016-11-09 MED ORDER — VITAMIN D (ERGOCALCIFEROL) 1.25 MG (50000 UNIT) PO CAPS: 50000.0000 [IU] | ORAL_CAPSULE | ORAL | 0 refills | Status: DC

## 2016-12-06 ENCOUNTER — Other Ambulatory Visit: Payer: Self-pay | Admitting: Orthopedic Surgery

## 2016-12-06 DIAGNOSIS — M4150 Other secondary scoliosis, site unspecified: Secondary | ICD-10-CM

## 2016-12-09 ENCOUNTER — Other Ambulatory Visit: Payer: BLUE CROSS/BLUE SHIELD

## 2016-12-13 ENCOUNTER — Ambulatory Visit
Admission: RE | Admit: 2016-12-13 | Discharge: 2016-12-13 | Disposition: A | Discharge status: Home / Self Care | Payer: BLUE CROSS/BLUE SHIELD | Source: Ambulatory Visit | Attending: Orthopedic Surgery | Admitting: Orthopedic Surgery

## 2016-12-13 DIAGNOSIS — M4150 Other secondary scoliosis, site unspecified: Secondary | ICD-10-CM

## 2016-12-22 ENCOUNTER — Other Ambulatory Visit: Payer: Self-pay | Admitting: Family Medicine

## 2016-12-22 DIAGNOSIS — Z1231 Encounter for screening mammogram for malignant neoplasm of breast: Secondary | ICD-10-CM

## 2016-12-30 ENCOUNTER — Telehealth: Payer: Self-pay | Admitting: Family Medicine

## 2016-12-30 ENCOUNTER — Other Ambulatory Visit: Payer: Self-pay | Admitting: Internal Medicine

## 2016-12-30 NOTE — Telephone Encounter (Signed)
Called pt and left a message to find out what conditions are causing her to need a handicap placard? Is it in regards to her past knee replacement? Is she having SOB? Is she having to park farther away from the office?   Also need to know when is the last time she took her vitamin D and how many capsules she has left? Is she taking the vitamin d OTC supplement?

## 2016-12-30 NOTE — Telephone Encounter (Signed)
Previous phone note had been created for this issue.  I have copied this message over to the prior phone note.

## 2016-12-30 NOTE — Telephone Encounter (Signed)
Copied from [place] [id number]. Topic: Inquiry >> Dec 30, 2016 10:04 AM Corie Chiquito, NT wrote: Reason for CRM: Patient called because she wanted to know if she could get a permanent handicap plaque and how to get back on her vitamin D schedule because she has missed two weeks of not taking them. If someone could give her a call back about this matter.   Jess,  Please call pt to advise on how to restart Vit D Rx. Do you have handicap forms there that we could mail to her? (If pt qualifies) If not, I will bring yo some.  Estill Bamberg

## 2016-12-30 NOTE — Telephone Encounter (Signed)
Pt stated that she wanted a permanent handicap placard for her S/P left knee replacement and "upcoming" right knee replacement. She has not taken her Vitamin D for the past 2 weeks but stated she took 1 today. She says she has 2 pills left in this bottle and has 1 refill remaining.

## 2016-12-30 NOTE — Telephone Encounter (Signed)
Please advise 

## 2016-12-30 NOTE — Telephone Encounter (Signed)
NOTES BELOW FROM  Carlisle Beers, RN from the [facility]:     Pt stated that she wanted a permanent handicap placard for her S/P left knee replacement and "upcoming" right knee replacement. She has not taken her Vitamin D for the past 2 weeks but stated she took 1 today. She says she has 2 pills left in this bottle and has 1 refill remaining.

## 2016-12-30 NOTE — Telephone Encounter (Signed)
If the permanent handicapped placard is for her knees, it really needs to come from Ortho.  I would just resume her once weekly Vit D (2 weeks left in her bottle) and then pick up the refill and take as directed

## 2016-12-31 NOTE — Telephone Encounter (Signed)
Called pt and left a message to return call. Could not leave a message as no DPR.    [place] for [facility] to Discuss results / PCP recommendations / Schedule patient

## 2016-12-31 NOTE — Telephone Encounter (Signed)
Patient notified to discuss handicap plaque with ortho provider. She was advised to continue her vit D and then pick up Rx at pharmacy.

## 2017-01-14 ENCOUNTER — Encounter: Payer: Self-pay | Admitting: Family Medicine

## 2017-01-14 ENCOUNTER — Ambulatory Visit (INDEPENDENT_AMBULATORY_CARE_PROVIDER_SITE_OTHER): Payer: BLUE CROSS/BLUE SHIELD | Admitting: Family Medicine

## 2017-01-14 ENCOUNTER — Other Ambulatory Visit: Payer: Self-pay

## 2017-01-14 VITALS — BP 124/85 | HR 95 | Temp 98.5°F | Resp 16 | Ht 61.0 in | Wt 222.4 lb

## 2017-01-14 DIAGNOSIS — J0100 Acute maxillary sinusitis, unspecified: Secondary | ICD-10-CM

## 2017-01-14 MED ORDER — PROMETHAZINE-DM 6.25-15 MG/5ML PO SYRP: 5.0000 mL | ORAL_SOLUTION | Freq: Four times a day (QID) | ORAL | 0 refills | Status: DC | PRN

## 2017-01-14 MED ORDER — AMOXICILLIN 875 MG PO TABS: 875.0000 mg | ORAL_TABLET | Freq: Two times a day (BID) | ORAL | 0 refills | Status: DC

## 2017-01-14 NOTE — Patient Instructions (Signed)
Follow up as needed or as scheduled Start the Amoxicillin twice daily- take w/ food Drink plenty of fluids REST! Use the cough syrup as needed- will cause drowsiness Mucinex DM for daytime cough/congestion Call with any questions or concerns Hang in there! Happy Holidays!!

## 2017-01-14 NOTE — Progress Notes (Signed)
   Subjective:    Patient ID: [name], female    DOB: [birth year], 61 y.o.   MRN: [id number]  HPI URI- sxs started 'over a week ago'.  Pt reports SOB even with sitting.  Had a fever 5 days ago but none recently.  Taking Mucinex All-In-1.  Cough is worse overnight, productive.  Hx of asthma as a child.  + sinus pain/pressure- worse w/ cough.  + HA.  No ear pain.  + tooth pain.   Review of Systems For ROS see HPI     Objective:   Physical Exam  Constitutional: She is oriented to person, place, and time. She appears well-developed and well-nourished. No distress.  HENT:  Head: Normocephalic and atraumatic.  Right Ear: Tympanic membrane normal.  Left Ear: Tympanic membrane normal.  Nose: Mucosal edema and rhinorrhea present. Right sinus exhibits maxillary sinus tenderness and frontal sinus tenderness. Left sinus exhibits maxillary sinus tenderness and frontal sinus tenderness.  Mouth/Throat: Uvula is midline and mucous membranes are normal. Posterior oropharyngeal erythema present. No oropharyngeal exudate.  Eyes: Conjunctivae and EOM are normal. Pupils are equal, round, and reactive to light.  Neck: Normal range of motion. Neck supple.  Cardiovascular: Normal rate, regular rhythm and normal heart sounds.  Pulmonary/Chest: Effort normal and breath sounds normal. No respiratory distress. She has no wheezes.  Lymphadenopathy:    She has no cervical adenopathy.  Neurological: She is alert and oriented to person, place, and time.  Psychiatric: She has a normal mood and affect. Her behavior is normal. Thought content normal.          Assessment & Plan:  Sinusitis- new.  Pt's sxs and PE consistent w/ infxn.  Start abx.  Cough meds prn.  Reviewed supportive care and red flags that should prompt return.  Pt expressed understanding and is in agreement w/ plan.

## 2017-01-19 ENCOUNTER — Ambulatory Visit
Admission: RE | Admit: 2017-01-19 | Discharge: 2017-01-19 | Disposition: A | Discharge status: Home / Self Care | Payer: BLUE CROSS/BLUE SHIELD | Source: Ambulatory Visit | Attending: Family Medicine | Admitting: Family Medicine

## 2017-01-19 DIAGNOSIS — Z1231 Encounter for screening mammogram for malignant neoplasm of breast: Secondary | ICD-10-CM

## 2017-01-20 ENCOUNTER — Telehealth: Payer: Self-pay | Admitting: *Deleted

## 2017-01-20 NOTE — Telephone Encounter (Signed)
Patient notified of PCP recommendations and is agreement and expresses an understanding.   [place] for [facility] to Discuss results / PCP recommendations / Schedule patient.

## 2017-01-20 NOTE — Telephone Encounter (Signed)
  Copied from [place] [id number]. Topic: General - Other >> Jan 20, 2017  8:31 AM Yvette Rack wrote: Reason for CRM: patient was calling to see if its ok to take Amoxicillin 886m  along with her Duexis [phone] call pt on cell

## 2017-01-20 NOTE — Telephone Encounter (Signed)
Yes- no issues taking both

## 2017-02-10 ENCOUNTER — Ambulatory Visit: Payer: Self-pay

## 2017-02-10 NOTE — Telephone Encounter (Signed)
Pt called with c/o 1.5 week hx of left upper back and shoulder pain, with some intermittent pain moving into left upper arm.  Stated the pain worsens with activity that she has to use the left arm for  twisting or turning.  Reported most of the pain is in the top of the shoulder.  Denied any anterior chest pain. Denied any swelling, warmth or redness in the left shoulder area.  Reported as left shoulder pain increases, it is more difficult to use her left arm.  Per protocol, scheduled an appt. tomorrow, 12/28, with Dr. Jonni Sanger.  Pt. strongly advised to call back if her symptoms worsen.  Verb. understanding and agrees with plan.           Reason for Disposition . [1] MODERATE pain (e.g., interferes with normal activities) AND [2] present > 3 days  Answer Assessment - Initial Assessment Questions 1. ONSET: "When did the pain start?"     About 1.5 weeks ago 2. LOCATION: "Where is the pain located?"     Upper left back to shoulder and at times into the left upper arm 3. PAIN: "How bad is the pain?" (Scale 1-10; or mild, moderate, severe)   - MILD (1-3): doesn't interfere with normal activities   - MODERATE (4-7): interferes with normal activities (e.g., work or school) or awakens from sleep   - SEVERE (8-10): excruciating pain, unable to do any normal activities, unable to move arm at all due to pain     Moderate; more constant and worsens with certain activities; like when using left arm for twisting and turning  4. WORK OR EXERCISE: "Has there been any recent work or exercise that involved this part of the body?"     Unsure.  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  5. CAUSE: "What do you think is causing the shoulder pain?"     Unsure 6. OTHER SYMPTOMS: "Do you have any other symptoms?" (e.g., neck pain, swelling, rash, fever, numbness, weakness)     No redness, warmth, swelling, or numbness.  As pain left shoulder increases the left arm use is decreased.   7.  PREGNANCY: "Is there any chance you are pregnant?" "When was your last menstrual period?"     No; partial hysterectomy  Protocols used: SHOULDER PAIN-A-AH

## 2017-02-11 ENCOUNTER — Encounter: Payer: Self-pay | Admitting: Family Medicine

## 2017-02-11 ENCOUNTER — Ambulatory Visit: Payer: BLUE CROSS/BLUE SHIELD | Admitting: Family Medicine

## 2017-02-11 VITALS — BP 110/80 | HR 82 | Temp 98.7°F | Ht 61.0 in | Wt 222.5 lb

## 2017-02-11 DIAGNOSIS — M25512 Pain in left shoulder: Secondary | ICD-10-CM

## 2017-02-11 DIAGNOSIS — M542 Cervicalgia: Secondary | ICD-10-CM | POA: Diagnosis not present

## 2017-02-11 MED ORDER — CYCLOBENZAPRINE HCL 10 MG PO TABS: 10.0000 mg | ORAL_TABLET | Freq: Every evening | ORAL | 0 refills | Status: DC | PRN

## 2017-02-11 MED ORDER — TRAMADOL HCL 50 MG PO TABS: 50.0000 mg | ORAL_TABLET | Freq: Four times a day (QID) | ORAL | 0 refills | Status: DC | PRN

## 2017-02-11 NOTE — Progress Notes (Signed)
Subjective  CC:  Chief Complaint  Patient presents with  . pain left upper back and left shoulder area    x 1.5 weeks    HPI: [name] is a 61 y.o. female who presents to the office today to address the problems listed above in the chief complaint.  61 yo obese female with chronic back pain and seeing NS on chronic nsaids presents with acute onset neck and left shoulder pain.  Came on suddenly while working at her desk. Reports constant pain, non-radiating w/o weakness or numbness. Pain with lifting left arm above head and turning head. Pain is mild to moderate. nsaids help but returns. Admits to increase activity and stress over holidays but no injury. Has h/o OA spine. No h/o shoulder problems.  I reviewed the patients updated PMH, FH, and SocHx.    Patient Active Problem List   Diagnosis Date Noted  . Morbid obesity ([place]) 11/08/2016  . Vitamin D deficiency 11/08/2016  . Back pain 01/28/2016  . S/P left TKA 01/14/2015  . S/P knee replacement 01/14/2015  . Thoracic back pain 04/05/2014  . Beta thalassemia, heterozygous 03/11/2014  . Right lumbar radiculopathy 11/23/2013  . Arm numbness 07/16/2013  . Right flank pain 12/20/2012  . Drowsiness 09/01/2012  . Ulcerative colitis, universal ([place]) 07/25/2012  . Routine general medical examination at a health care facility 05/19/2012  . Sebaceous cyst 05/19/2012  . THYROIDITIS 01/27/2010  . MIGRAINE HEADACHE 01/27/2010  . ALLERGIC RHINITIS 12/31/2009  . ASTHMA 12/31/2009  . PALPITATIONS 12/31/2009  . SHORTNESS OF BREATH 12/31/2009   Current Meds  Medication Sig  . calcium carbonate (TUMS FRESHERS) 500 MG chewable tablet Chew 1 tablet by mouth daily. As needed  . DUEXIS 800-26.6 MG TABS Take 1 tablet by mouth 2 (two) times daily as needed (for pain/inflammation).   . mesalamine (LIALDA) 1.2 g EC tablet Take 4 tablets (4.8 g total) by mouth daily.  . pantoprazole (PROTONIX) 40 MG tablet Take 1 tablet (40 mg total) by mouth  daily.  . rizatriptan (MAXALT) 10 MG tablet Take 1 tablet (10 mg total) by mouth as needed for migraine. May repeat in 2 hours if needed  . Vitamin D, Ergocalciferol, (DRISDOL) 50000 units CAPS capsule Take 1 capsule (50,000 Units total) by mouth every 7 (seven) days.    Allergies: Patient is allergic to no known allergies. Family History: Patient family history includes Asthma in her unknown relative; Diabetes in her brother and daughter; Sickle cell trait in her mother. Social History:  Patient  reports that  has never smoked. she has never used smokeless tobacco. She reports that she does not drink alcohol or use drugs.  Review of Systems: Constitutional: Negative for fever malaise or anorexia Cardiovascular: negative for chest pain Respiratory: negative for SOB or persistent cough Gastrointestinal: negative for abdominal pain  Objective  Vitals: BP 110/80 (BP Location: Left Arm, Patient Position: Sitting, Cuff Size: Large)   Pulse 82   Temp 98.7 F (37.1 C) (Oral)   Ht 5' 1"  (1.549 m)   Wt 222 lb 8 oz (100.9 kg)   SpO2 95%   BMI 42.04 kg/m  General: appears mildly uncomfortable. Moves easily to exam table , A&Ox3 HEENT: PEERL, conjunctiva normal, Oropharynx moist,neck is supple but ttp over left trap and upper thoracic back muscles Left shoulder: FROM, tender ant and posterior; neg drop test. Mildly + empty can testing; neg impingement MSK - 5/5 strength B UE  Skin: no rash, dry, warm  Assessment  1. Acute pain of left shoulder   2. Neck pain      Plan   MSK pain due to OA and/or Rotator cuff:  Educated. Continue nsaids. Add mm relaxer, heat, massage and rom exercises. Ice shoulder. Work on posture at work. F/u in 2-3 weeks if not improving. Consider xrays, steroid injection if needed.   Follow up: prn if not better    Commons side effects, risks, benefits, and alternatives for medications and treatment plan prescribed today were discussed, and the patient expressed  understanding of the given instructions. Patient is instructed to call or message via MyChart if he/she has any questions or concerns regarding our treatment plan. No barriers to understanding were identified. We discussed Red Flag symptoms and signs in detail. Patient expressed understanding regarding what to do in case of urgent or emergency type symptoms.   Medication list was reconciled, printed and provided to the patient in AVS. Patient instructions and summary information was reviewed with the patient as documented in the AVS. This note was prepared with assistance of Dragon voice recognition software. Occasional wrong-word or sound-a-like substitutions may have occurred due to the inherent limitations of voice recognition software  No orders of the defined types were placed in this encounter.  No orders of the defined types were placed in this encounter.

## 2017-02-11 NOTE — Patient Instructions (Signed)
Make an appointment in 2-3 weeks if pain is not improved.  Heat and massage to neck muscles. Ice to shoulder.

## 2017-03-16 ENCOUNTER — Other Ambulatory Visit: Payer: Self-pay | Admitting: Internal Medicine

## 2017-03-17 DIAGNOSIS — M542 Cervicalgia: Secondary | ICD-10-CM | POA: Insufficient documentation

## 2017-03-17 DIAGNOSIS — N62 Hypertrophy of breast: Secondary | ICD-10-CM | POA: Insufficient documentation

## 2017-03-17 DIAGNOSIS — Z9889 Other specified postprocedural states: Secondary | ICD-10-CM | POA: Insufficient documentation

## 2017-03-21 ENCOUNTER — Other Ambulatory Visit: Payer: Self-pay

## 2017-03-21 ENCOUNTER — Encounter: Payer: Self-pay | Admitting: Physician Assistant

## 2017-03-21 ENCOUNTER — Ambulatory Visit: Payer: BLUE CROSS/BLUE SHIELD | Admitting: Physician Assistant

## 2017-03-21 VITALS — BP 110/70 | HR 73 | Temp 98.1°F | Resp 14 | Ht 61.0 in | Wt 223.0 lb

## 2017-03-21 DIAGNOSIS — H6692 Otitis media, unspecified, left ear: Secondary | ICD-10-CM

## 2017-03-21 DIAGNOSIS — J019 Acute sinusitis, unspecified: Secondary | ICD-10-CM

## 2017-03-21 DIAGNOSIS — B9789 Other viral agents as the cause of diseases classified elsewhere: Secondary | ICD-10-CM | POA: Diagnosis not present

## 2017-03-21 MED ORDER — AMOXICILLIN-POT CLAVULANATE 875-125 MG PO TABS: 1.0000 | ORAL_TABLET | Freq: Two times a day (BID) | ORAL | 0 refills | Status: DC

## 2017-03-21 NOTE — Progress Notes (Signed)
Patient presents to clinic today c/o sinus pressure, tooth pain, sinus pain, nasal congestion, some mild chest congestion and dry cough. Denies recent travel or sick contact. Patient with history of sinusitis and states this feels similar. Has taken Mucinex-DM for symptom relief. Patient noting L ear pain since this morning.   Past Medical History:  Diagnosis Date  . Arthritis   . GERD (gastroesophageal reflux disease)   . Microcytic anemia   . Migraine   . Scoliosis   . UC (ulcerative colitis) ([place]) 06/2012   Colonoscopy with Dr. Hilarie Fredrickson    Current Outpatient Medications on File Prior to Visit  Medication Sig Dispense Refill  . calcium carbonate (TUMS FRESHERS) 500 MG chewable tablet Chew 1 tablet by mouth daily. As needed    . cyclobenzaprine (FLEXERIL) 10 MG tablet Take 1 tablet (10 mg total) by mouth at bedtime as needed for muscle spasms. 30 tablet 0  . DUEXIS 800-26.6 MG TABS Take 1 tablet by mouth 2 (two) times daily as needed (for pain/inflammation).     . mesalamine (LIALDA) 1.2 g EC tablet Take 4 tablets (4.8 g total) by mouth daily. 120 tablet 1  . pantoprazole (PROTONIX) 40 MG tablet TAKE 1 TABLET BY MOUTH ONCE DAILY 30 tablet 3  . rizatriptan (MAXALT) 10 MG tablet Take 1 tablet (10 mg total) by mouth as needed for migraine. May repeat in 2 hours if needed 10 tablet 3  . traMADol (ULTRAM) 50 MG tablet Take 1 tablet (50 mg total) by mouth every 6 (six) hours as needed for moderate pain. 20 tablet 0   No current facility-administered medications on file prior to visit.     Allergies  Allergen Reactions  . No Known Allergies     Family History  Problem Relation Age of Onset  . Sickle cell trait Mother   . Diabetes Brother   . Diabetes Daughter   . Asthma Unknown   . Colon cancer Neg Hx     Social History   Socioeconomic History  . Marital status: Divorced    Spouse name: None  . Number of children: 3  . Years of education: college  . Highest education level:  None  Social Needs  . Financial resource strain: None  . Food insecurity - worry: None  . Food insecurity - inability: None  . Transportation needs - medical: None  . Transportation needs - non-medical: None  Occupational History    Employer: [employer]    Comment: Computers  Tobacco Use  . Smoking status: Never Smoker  . Smokeless tobacco: Never Used  Substance and Sexual Activity  . Alcohol use: No    Alcohol/week: 0.0 oz  . Drug use: No  . Sexual activity: None  Other Topics Concern  . None  Social History Narrative   Patient lives at home alone and she is divorced. Patient works with computers. Patient has a college education.   Right handed.   Caffeine.- one cup daily.     Review of Systems - See HPI.  All other ROS are negative.  BP 110/70   Pulse 73   Temp 98.1 F (36.7 C) (Oral)   Resp 14   Ht 5' 1"  (1.549 m)   Wt 223 lb (101.2 kg)   SpO2 96%   BMI 42.14 kg/m   Physical Exam  Constitutional: She is well-developed, well-nourished, and in no distress.  HENT:  Head: Normocephalic and atraumatic.  Right Ear: Tympanic membrane normal.  Left Ear: Tympanic  membrane is erythematous and bulging.  Nose: Mucosal edema and rhinorrhea present. Right sinus exhibits maxillary sinus tenderness. Right sinus exhibits no frontal sinus tenderness. Left sinus exhibits maxillary sinus tenderness. Left sinus exhibits no frontal sinus tenderness.  Mouth/Throat: Uvula is midline, oropharynx is clear and moist and mucous membranes are normal.  Eyes: Conjunctivae are normal.  Neck: Neck supple.  Cardiovascular: Normal rate, regular rhythm, normal heart sounds and intact distal pulses.  Pulmonary/Chest: Effort normal and breath sounds normal. No respiratory distress. She has no wheezes. She has no rales. She exhibits no tenderness.  Vitals reviewed.  Assessment/Plan: 1. Acute viral sinusitis Supportive measures and OTC medications reviewed. Antibiotics started as noted  below due to bacterial AOM. Follow-up discussed.  2. Acute otitis media, left Start Augmentin. OTC analgesics reviewed. - amoxicillin-clavulanate (AUGMENTIN) 875-125 MG tablet; Take 1 tablet by mouth 2 (two) times daily.  Dispense: 14 tablet; Refill: 0   Leeanne Rio, PA-C

## 2017-03-21 NOTE — Patient Instructions (Signed)
Please take antibiotic as directed for ear infection.  Increase fluid intake.  Use Saline nasal spray.  Take a daily multivitamin. Mucinex-D or DM as directed.  Place a humidifier in the bedroom.  Please call or return clinic if symptoms are not improving.  Sinusitis Sinusitis is redness, soreness, and swelling (inflammation) of the paranasal sinuses. Paranasal sinuses are air pockets within the bones of your face (beneath the eyes, the middle of the forehead, or above the eyes). In healthy paranasal sinuses, mucus is able to drain out, and air is able to circulate through them by way of your nose. However, when your paranasal sinuses are inflamed, mucus and air can become trapped. This can allow bacteria and other germs to grow and cause infection. Sinusitis can develop quickly and last only a short time (acute) or continue over a long period (chronic). Sinusitis that lasts for more than 12 weeks is considered chronic.  CAUSES  Causes of sinusitis include:  Allergies.  Structural abnormalities, such as displacement of the cartilage that separates your nostrils (deviated septum), which can decrease the air flow through your nose and sinuses and affect sinus drainage.  Functional abnormalities, such as when the small hairs (cilia) that line your sinuses and help remove mucus do not work properly or are not present. SYMPTOMS  Symptoms of acute and chronic sinusitis are the same. The primary symptoms are pain and pressure around the affected sinuses. Other symptoms include:  Upper toothache.  Earache.  Headache.  Bad breath.  Decreased sense of smell and taste.  A cough, which worsens when you are lying flat.  Fatigue.  Fever.  Thick drainage from your nose, which often is green and may contain pus (purulent).  Swelling and warmth over the affected sinuses. DIAGNOSIS  Your caregiver will perform a physical exam. During the exam, your caregiver may:  Look in your nose for signs of  abnormal growths in your nostrils (nasal polyps).  Tap over the affected sinus to check for signs of infection.  View the inside of your sinuses (endoscopy) with a special imaging device with a light attached (endoscope), which is inserted into your sinuses. If your caregiver suspects that you have chronic sinusitis, one or more of the following tests may be recommended:  Allergy tests.  Nasal culture A sample of mucus is taken from your nose and sent to a lab and screened for bacteria.  Nasal cytology A sample of mucus is taken from your nose and examined by your caregiver to determine if your sinusitis is related to an allergy. TREATMENT  Most cases of acute sinusitis are related to a viral infection and will resolve on their own within 10 days. Sometimes medicines are prescribed to help relieve symptoms (pain medicine, decongestants, nasal steroid sprays, or saline sprays).  However, for sinusitis related to a bacterial infection, your caregiver will prescribe antibiotic medicines. These are medicines that will help kill the bacteria causing the infection.  Rarely, sinusitis is caused by a fungal infection. In theses cases, your caregiver will prescribe antifungal medicine. For some cases of chronic sinusitis, surgery is needed. Generally, these are cases in which sinusitis recurs more than 3 times per year, despite other treatments. HOME CARE INSTRUCTIONS   Drink plenty of water. Water helps thin the mucus so your sinuses can drain more easily.  Use a humidifier.  Inhale steam 3 to 4 times a day (for example, sit in the bathroom with the shower running).  Apply a warm, moist washcloth to  your face 3 to 4 times a day, or as directed by your caregiver.  Use saline nasal sprays to help moisten and clean your sinuses.  Take over-the-counter or prescription medicines for pain, discomfort, or fever only as directed by your caregiver. SEEK IMMEDIATE MEDICAL CARE IF:  You have increasing  pain or severe headaches.  You have nausea, vomiting, or drowsiness.  You have swelling around your face.  You have vision problems.  You have a stiff neck.  You have difficulty breathing. MAKE SURE YOU:   Understand these instructions.  Will watch your condition.  Will get help right away if you are not doing well or get worse. Document Released: 02/01/2005 Document Revised: 04/26/2011 Document Reviewed: 02/16/2011 [facility] Patient Information 2014 [place], [place].

## 2017-04-13 ENCOUNTER — Telehealth: Payer: Self-pay | Admitting: Internal Medicine

## 2017-04-14 MED ORDER — MESALAMINE 1.2 G PO TBEC: 2.4000 g | DELAYED_RELEASE_TABLET | Freq: Every day | ORAL | 2 refills | Status: DC

## 2017-04-14 MED ORDER — PANTOPRAZOLE SODIUM 40 MG PO TBEC: 40.0000 mg | DELAYED_RELEASE_TABLET | Freq: Every day | ORAL | 2 refills | Status: DC

## 2017-04-14 NOTE — Telephone Encounter (Signed)
Rx sent 

## 2017-05-09 ENCOUNTER — Ambulatory Visit: Payer: BLUE CROSS/BLUE SHIELD | Admitting: Family Medicine

## 2017-05-09 DIAGNOSIS — Z0289 Encounter for other administrative examinations: Secondary | ICD-10-CM

## 2017-05-11 ENCOUNTER — Ambulatory Visit: Payer: Self-pay

## 2017-05-11 NOTE — Telephone Encounter (Signed)
Patient called in with c/o "palpitations."  She says "I've been having them off and on since Monday. I have a migraine too, which is chronic, along with the dizziness. I have been seen for panic attacks and anxiety in the past. I was told these palpitations were from that.  I have an appointment tomorrow, but I just want to know what I need to look for as far as a heart attack and at what point should I go to an UC or ER." I asked her to measure her HR, she measured as 76 on her wrist counting for 15 minutes. I asked about chest pain, sweating, difficulty breathing, she says "none of those, just the migraine and dizziness from the migraine." According to protocol, see PCP within 2 weeks, appointment already scheduled for tomorrow, care advice given, patient verbalized understanding.  Reason for Disposition . Problems with anxiety or stress  Answer Assessment - Initial Assessment Questions 1. DESCRIPTION: "Please describe your heart rate or heart beat that you are having" (e.g., fast/slow, regular/irregular, skipped or extra beats, "palpitations")     Palpitations 2. ONSET: "When did it start?" (Minutes, hours or days)      Monday  3. DURATION: "How long does it last" (e.g., seconds, minutes, hours)     A few minutes 4. PATTERN "Does it come and go, or has it been constant since it started?"  "Does it get worse with exertion?"   "Are you feeling it now?"     Come and go several times a day 5. TAP: "Using your hand, can you tap out what you are feeling on a chair or table in front of you, so that I can hear?" (Note: not all patients can do this)       N/A 6. HEART RATE: "Can you tell me your heart rate?" "How many beats in 15 seconds?"  (Note: not all patients can do this)       76 7. RECURRENT SYMPTOM: "Have you ever had this before?" If so, ask: "When was the last time?" and "What happened that time?"      Yes, a while ago, had to wear a heart monitor 8. CAUSE: "What do you think is causing the  palpitations?"     Maybe stress, panic attack 9. CARDIAC HISTORY: "Do you have any history of heart disease?" (e.g., heart attack, angina, bypass surgery, angioplasty, arrhythmia)      No 10. OTHER SYMPTOMS: "Do you have any other symptoms?" (e.g., dizziness, chest pain, sweating, difficulty breathing)       Migraine w/dizziness 11. PREGNANCY: "Is there any chance you are pregnant?" "When was your last menstrual period?"       No  Protocols used: HEART RATE AND HEARTBEAT QUESTIONS-A-AH

## 2017-05-12 ENCOUNTER — Encounter: Payer: Self-pay | Admitting: Family Medicine

## 2017-05-12 ENCOUNTER — Other Ambulatory Visit: Payer: Self-pay

## 2017-05-12 ENCOUNTER — Ambulatory Visit: Payer: BLUE CROSS/BLUE SHIELD | Admitting: Family Medicine

## 2017-05-12 VITALS — BP 126/89 | HR 76 | Resp 16 | Ht 61.0 in | Wt 222.2 lb

## 2017-05-12 DIAGNOSIS — R002 Palpitations: Secondary | ICD-10-CM

## 2017-05-12 NOTE — Progress Notes (Signed)
   Subjective:    Patient ID: [name], female    DOB: 1955-05-01, 62 y.o.   MRN: [id number]  HPI Palpitations- pt reports she her 'heart started fluttering' on Monday.  'it settled down' and pt went to work on Tuesday.  Yesterday AM, she woke 'with a terrible migraine'.  She did not go to work and spent the day resting.  sxs are intermittent.  Some associated SOB.  sxs will last for a few minutes- resolves w/ lying down.  sxs return once up and about if she was only resting for a few minutes.  Hx of similar- was dx'd w/ anxiety.  Pt reports high levels of stress recently.  Denies CP or pressure.  Pt drinks 'a lot of green tea'.  Last episode of fluttering was early this AM.  Denies GERD.  Review of Systems For ROS see HPI     Objective:   Physical Exam  Constitutional: She is oriented to person, place, and time. She appears well-developed and well-nourished. No distress.  HENT:  Head: Normocephalic and atraumatic.  Eyes: Pupils are equal, round, and reactive to light. Conjunctivae and EOM are normal.  Neck: Normal range of motion. Neck supple. No thyromegaly present.  Cardiovascular: Normal rate, regular rhythm, normal heart sounds and intact distal pulses.  No murmur heard. Pulmonary/Chest: Effort normal and breath sounds normal. No respiratory distress.  Abdominal: Soft. She exhibits no distension. There is no tenderness.  Musculoskeletal: She exhibits no edema.  Lymphadenopathy:    She has no cervical adenopathy.  Neurological: She is alert and oriented to person, place, and time.  Skin: Skin is warm and dry.  Psychiatric: She has a normal mood and affect. Her behavior is normal.  Vitals reviewed.         Assessment & Plan:

## 2017-05-12 NOTE — Patient Instructions (Signed)
Follow up as needed or as scheduled We'll notify you of your lab results and make any changes if needed We'll call you with your holter monitor to evaluate the palpitations EKG looks great!!! Try and limit your caffeine intake and work on stress management Call with any questions or concerns- especially if symptoms change or worsen Hang in there!!!

## 2017-05-13 ENCOUNTER — Other Ambulatory Visit (INDEPENDENT_AMBULATORY_CARE_PROVIDER_SITE_OTHER): Payer: BLUE CROSS/BLUE SHIELD

## 2017-05-13 ENCOUNTER — Telehealth: Payer: Self-pay | Admitting: *Deleted

## 2017-05-13 DIAGNOSIS — R002 Palpitations: Secondary | ICD-10-CM

## 2017-05-13 LAB — CBC WITH DIFFERENTIAL/PLATELET
BASOS ABS: 27 {cells}/uL (ref 0–200)
Basophils Relative: 0.4 %
EOS ABS: 428 {cells}/uL (ref 15–500)
EOS PCT: 6.3 %
HCT: 40.6 % (ref 35.0–45.0)
Hemoglobin: 13.5 g/dL (ref 11.7–15.5)
Lymphs Abs: 2271 cells/uL (ref 850–3900)
MCH: 22.6 pg — ABNORMAL LOW (ref 27.0–33.0)
MCHC: 33.3 g/dL (ref 32.0–36.0)
MCV: 67.9 fL — AB (ref 80.0–100.0)
MONOS PCT: 4.9 %
MPV: 9.2 fL (ref 7.5–12.5)
NEUTROS ABS: 3740 {cells}/uL (ref 1500–7800)
Neutrophils Relative %: 55 %
PLATELETS: 335 10*3/uL (ref 140–400)
RBC: 5.98 10*6/uL — ABNORMAL HIGH (ref 3.80–5.10)
RDW: 15.3 % — ABNORMAL HIGH (ref 11.0–15.0)
Total Lymphocyte: 33.4 %
WBC mixed population: 333 cells/uL (ref 200–950)
WBC: 6.8 10*3/uL (ref 3.8–10.8)

## 2017-05-13 LAB — LIPID PANEL
CHOLESTEROL: 179 mg/dL (ref <200)
HDL: 63 mg/dL (ref >50)
LDL CHOLESTEROL (CALC): 100 mg/dL — AB
Non-HDL Cholesterol (Calc): 116 mg/dL (calc) (ref <130)
Total CHOL/HDL Ratio: 2.8 (calc) (ref <5.0)
Triglycerides: 69 mg/dL (ref <150)

## 2017-05-13 LAB — HEPATIC FUNCTION PANEL
AG Ratio: 1.2 (calc) (ref 1.0–2.5)
ALKALINE PHOSPHATASE (APISO): 75 U/L (ref 33–130)
ALT: 14 U/L (ref 6–29)
AST: 19 U/L (ref 10–35)
Albumin: 4.2 g/dL (ref 3.6–5.1)
BILIRUBIN INDIRECT: 0.5 mg/dL (ref 0.2–1.2)
Bilirubin, Direct: 0.2 mg/dL (ref 0.0–0.2)
GLOBULIN: 3.4 g/dL (ref 1.9–3.7)
TOTAL PROTEIN: 7.6 g/dL (ref 6.1–8.1)
Total Bilirubin: 0.7 mg/dL (ref 0.2–1.2)

## 2017-05-13 LAB — BASIC METABOLIC PANEL
BUN: 9 mg/dL (ref 7–25)
CALCIUM: 9.6 mg/dL (ref 8.6–10.4)
CO2: 26 mmol/L (ref 20–32)
CREATININE: 0.75 mg/dL (ref 0.50–1.05)
Chloride: 104 mmol/L (ref 98–110)
GLUCOSE: 62 mg/dL — AB (ref 65–99)
Potassium: 4.1 mmol/L (ref 3.5–5.3)
SODIUM: 142 mmol/L (ref 135–146)

## 2017-05-13 LAB — TSH: TSH: 0.99 m[IU]/L (ref 0.40–4.50)

## 2017-05-13 NOTE — Telephone Encounter (Signed)
Referral fax from [name], MD of [facility] was placed in scheduling box. P# [id number]  F# [phone]

## 2017-05-15 NOTE — Assessment & Plan Note (Signed)
Pt has hx of this previously and it was determined at that time to be stress related.  She has been under quite a bit of stress and drinking 'a lot of green tea'.  I don't have a high suspicion that this is cardiac, especially given her normal EKG, but will get holter monitor to assess.  Pt expressed understanding and is in agreement w/ plan.

## 2017-05-15 NOTE — Assessment & Plan Note (Signed)
Ongoing issue for pt.  BMI is 42.  Stressed need for healthy diet, regular exercise- once we have cleared her due to palpitations.  Check labs to risk stratify.  Will follow closely

## 2017-05-16 ENCOUNTER — Encounter: Payer: Self-pay | Admitting: General Practice

## 2017-05-16 ENCOUNTER — Other Ambulatory Visit: Payer: BLUE CROSS/BLUE SHIELD

## 2017-05-30 ENCOUNTER — Other Ambulatory Visit: Payer: Self-pay

## 2017-05-30 ENCOUNTER — Encounter (HOSPITAL_BASED_OUTPATIENT_CLINIC_OR_DEPARTMENT_OTHER): Payer: Self-pay | Admitting: *Deleted

## 2017-06-06 ENCOUNTER — Ambulatory Visit (INDEPENDENT_AMBULATORY_CARE_PROVIDER_SITE_OTHER): Payer: BLUE CROSS/BLUE SHIELD

## 2017-06-06 DIAGNOSIS — R002 Palpitations: Secondary | ICD-10-CM | POA: Diagnosis not present

## 2017-06-15 ENCOUNTER — Ambulatory Visit: Payer: Self-pay | Admitting: Plastic Surgery

## 2017-06-15 ENCOUNTER — Encounter (HOSPITAL_BASED_OUTPATIENT_CLINIC_OR_DEPARTMENT_OTHER): Payer: Self-pay | Admitting: Plastic Surgery

## 2017-06-15 DIAGNOSIS — N62 Hypertrophy of breast: Secondary | ICD-10-CM

## 2017-06-15 NOTE — H&P (Signed)
[name] is an 62 y.o. female.   Chief Complaint: mammary hypertrophy HPI: [name] is a 62 y.o. Female here for pre operative history and physical prior to bilateral breast reduction surgery. She has a history of mammary hyperplasia for several years.  She has extremely large breasts causing symptoms that include the following complaints: Back pain (upper and lower) and neck pain. She frequently pins bra cups higher on straps for better lift and relief. Notices relief when holding breast up in her hands. Shoulder straps causing grooves, pain occasionally requiring padding. Pain medication is sometimes required with motrin and tylenol.  She is finding it very difficult to find a bra that fits.  Activities that are hindered by enlarged breasts include: running and exercise. Mammogram history: 01/2017 and was negative, BI-RADS 1  Her breasts are extremely large and fairly symmetric.  She has hyperpigmentation of the inframammary area on both sides.   She is 5 feet 2 inches tall and weighs 224 pounds.   Preoperative bra size = 40 F cup.   Nipple to inframammary fold distance:  18 cm Sternal Noth to nipple distance:  Right = 35 cm, Left = 34 cm Internipple distance:  20 cm Nipple to Midline Distance:  10 cm Areolar Diameter:  12 cm The estimated excess breast tissue to be removed at the time of surgery = 1000 grams on the left and 1000 grams on the right.    Past Medical History:  Diagnosis Date  . Arthritis   . GERD (gastroesophageal reflux disease)   . Microcytic anemia   . Migraine   . Scoliosis   . UC (ulcerative colitis) ([place]) 06/2012   Colonoscopy with Dr. Hilarie Fredrickson    Past Surgical History:  Procedure Laterality Date  . ABDOMINAL HYSTERECTOMY     partial  . ANTERIOR LAT LUMBAR FUSION N/A 01/28/2016   Procedure: ANTERIOR LATERAL LUMBAR FUSION 2 LEVELS XLIF L2-4;  Surgeon: Melina Schools, MD;  Location: [facility];  Service: Orthopedics;  Laterality: N/A;  . BACK SURGERY    .  BREAST EXCISIONAL BIOPSY Bilateral    benign  . BREAST SURGERY Bilateral    cysts 62 yrs old  . COLONOSCOPY  20 + years ago   In [place].  . KNEE SURGERY Left 12-13,11-10   meniscal repairs  . LUMBAR FUSION  01/28/2016   ANTERIOR LATERAL LUMBAR FUSION 2 LEVELS XLIF L2-4 (N/A)  . TOTAL KNEE ARTHROPLASTY Left 01/14/2015   Procedure: TOTAL KNEE ARTHROPLASTY;  Surgeon: Paralee Cancel, MD;  Location: [facility];  Service: Orthopedics;  Laterality: Left;    Family History  Problem Relation Age of Onset  . Sickle cell trait Mother   . Diabetes Brother   . Diabetes Daughter   . Asthma Unknown   . Colon cancer Neg Hx    Social History:  reports that she has never smoked. She has never used smokeless tobacco. She reports that she does not drink alcohol or use drugs.  Allergies:  Allergies  Allergen Reactions  . No Known Allergies     No medications prior to admission.    No results found for this or any previous visit (from the past 48 hour(s)). No results found.  Review of Systems  Constitutional: Negative.   HENT: Negative.   Eyes: Negative.   Respiratory: Negative.   Cardiovascular: Negative.   Gastrointestinal: Negative.   Genitourinary: Negative.   Musculoskeletal: Negative.   Skin: Negative.   Neurological: Negative.   Psychiatric/Behavioral: Negative.  Height 5' 1"  (1.549 m), weight 100.7 kg (222 lb). Physical Exam  Constitutional: She is oriented to person, place, and time. She appears well-developed and well-nourished.  HENT:  Head: Normocephalic and atraumatic.  Eyes: Pupils are equal, round, and reactive to light. EOM are normal.  Cardiovascular: Normal rate.  Respiratory: Effort normal.  GI: Soft. She exhibits no distension.  Neurological: She is alert and oriented to person, place, and time.  Skin: Skin is warm. No erythema.  Psychiatric: She has a normal mood and affect. Her behavior is normal. Judgment and thought content normal.      Assessment/Plan Plan for bilateral breast reduction.  Risks and complications were discussed and the patient agrees to proceed.  [place], DO 06/15/2017, 7:53 AM

## 2017-06-16 ENCOUNTER — Other Ambulatory Visit: Payer: Self-pay

## 2017-06-16 ENCOUNTER — Ambulatory Visit (HOSPITAL_BASED_OUTPATIENT_CLINIC_OR_DEPARTMENT_OTHER): Payer: BLUE CROSS/BLUE SHIELD | Admitting: Anesthesiology

## 2017-06-16 ENCOUNTER — Ambulatory Visit (HOSPITAL_BASED_OUTPATIENT_CLINIC_OR_DEPARTMENT_OTHER)
Admission: RE | Admit: 2017-06-16 | Discharge: 2017-06-16 | Disposition: A | Discharge status: Home / Self Care | Payer: BLUE CROSS/BLUE SHIELD | Source: Ambulatory Visit | Attending: Plastic Surgery | Admitting: Plastic Surgery

## 2017-06-16 ENCOUNTER — Encounter (HOSPITAL_BASED_OUTPATIENT_CLINIC_OR_DEPARTMENT_OTHER)
Admission: RE | Disposition: A | Discharge status: Home / Self Care | Payer: Self-pay | Source: Ambulatory Visit | Attending: Plastic Surgery

## 2017-06-16 ENCOUNTER — Encounter (HOSPITAL_BASED_OUTPATIENT_CLINIC_OR_DEPARTMENT_OTHER): Payer: Self-pay | Admitting: Plastic Surgery

## 2017-06-16 DIAGNOSIS — K279 Peptic ulcer, site unspecified, unspecified as acute or chronic, without hemorrhage or perforation: Secondary | ICD-10-CM | POA: Diagnosis not present

## 2017-06-16 DIAGNOSIS — M542 Cervicalgia: Secondary | ICD-10-CM | POA: Diagnosis not present

## 2017-06-16 DIAGNOSIS — K219 Gastro-esophageal reflux disease without esophagitis: Secondary | ICD-10-CM | POA: Insufficient documentation

## 2017-06-16 DIAGNOSIS — Z96652 Presence of left artificial knee joint: Secondary | ICD-10-CM | POA: Insufficient documentation

## 2017-06-16 DIAGNOSIS — M419 Scoliosis, unspecified: Secondary | ICD-10-CM | POA: Insufficient documentation

## 2017-06-16 DIAGNOSIS — G43909 Migraine, unspecified, not intractable, without status migrainosus: Secondary | ICD-10-CM | POA: Insufficient documentation

## 2017-06-16 DIAGNOSIS — D509 Iron deficiency anemia, unspecified: Secondary | ICD-10-CM | POA: Diagnosis not present

## 2017-06-16 DIAGNOSIS — N62 Hypertrophy of breast: Secondary | ICD-10-CM | POA: Insufficient documentation

## 2017-06-16 DIAGNOSIS — Z90710 Acquired absence of both cervix and uterus: Secondary | ICD-10-CM | POA: Insufficient documentation

## 2017-06-16 DIAGNOSIS — R0602 Shortness of breath: Secondary | ICD-10-CM | POA: Diagnosis not present

## 2017-06-16 DIAGNOSIS — Z981 Arthrodesis status: Secondary | ICD-10-CM | POA: Diagnosis not present

## 2017-06-16 DIAGNOSIS — M1990 Unspecified osteoarthritis, unspecified site: Secondary | ICD-10-CM | POA: Insufficient documentation

## 2017-06-16 DIAGNOSIS — M549 Dorsalgia, unspecified: Secondary | ICD-10-CM | POA: Insufficient documentation

## 2017-06-16 DIAGNOSIS — Z6841 Body Mass Index (BMI) 40.0 and over, adult: Secondary | ICD-10-CM | POA: Insufficient documentation

## 2017-06-16 DIAGNOSIS — K5190 Ulcerative colitis, unspecified, without complications: Secondary | ICD-10-CM | POA: Diagnosis not present

## 2017-06-16 HISTORY — PX: BREAST REDUCTION SURGERY: SHX8

## 2017-06-16 SURGERY — MAMMOPLASTY, REDUCTION
Anesthesia: General | Laterality: Bilateral

## 2017-06-16 MED ORDER — PHENYLEPHRINE HCL 10 MG/ML IJ SOLN
INTRAMUSCULAR | Status: AC
  Filled 2017-06-16: qty 1

## 2017-06-16 MED ORDER — SUFENTANIL CITRATE 50 MCG/ML IV SOLN
INTRAVENOUS | Status: DC | PRN
  Administered 2017-06-16: 30 ug via INTRAVENOUS
  Administered 2017-06-16: 10 ug via INTRAVENOUS

## 2017-06-16 MED ORDER — SODIUM CHLORIDE 0.9 % IV SOLN
INTRAVENOUS | Status: DC | PRN
  Administered 2017-06-16: 500 mL

## 2017-06-16 MED ORDER — SODIUM CHLORIDE 0.9 % IV SOLN
INTRAVENOUS | Status: AC | PRN
  Administered 2017-06-16: 100 mL

## 2017-06-16 MED ORDER — OXYCODONE HCL 5 MG/5ML PO SOLN: 5.0000 mg | Freq: Once | ORAL | Status: DC | PRN

## 2017-06-16 MED ORDER — SUCCINYLCHOLINE CHLORIDE 200 MG/10ML IV SOSY
PREFILLED_SYRINGE | INTRAVENOUS | Status: AC
  Filled 2017-06-16: qty 10

## 2017-06-16 MED ORDER — EPHEDRINE SULFATE 50 MG/ML IJ SOLN
INTRAMUSCULAR | Status: DC | PRN
  Administered 2017-06-16: 15 mg via INTRAVENOUS

## 2017-06-16 MED ORDER — MIDAZOLAM HCL 2 MG/2ML IJ SOLN
1.0000 mg | INTRAMUSCULAR | Status: DC | PRN
  Administered 2017-06-16: 2 mg via INTRAVENOUS

## 2017-06-16 MED ORDER — OXYCODONE HCL 5 MG PO TABS: 5.0000 mg | ORAL_TABLET | ORAL | Status: DC | PRN

## 2017-06-16 MED ORDER — SODIUM CHLORIDE 0.9% FLUSH: 3.0000 mL | INTRAVENOUS | Status: DC | PRN

## 2017-06-16 MED ORDER — PROMETHAZINE HCL 25 MG/ML IJ SOLN: 6.2500 mg | INTRAMUSCULAR | Status: DC | PRN

## 2017-06-16 MED ORDER — FENTANYL CITRATE (PF) 100 MCG/2ML IJ SOLN: 50.0000 ug | INTRAMUSCULAR | Status: DC | PRN

## 2017-06-16 MED ORDER — HYDROMORPHONE HCL 1 MG/ML IJ SOLN
INTRAMUSCULAR | Status: AC
  Filled 2017-06-16: qty 0.5

## 2017-06-16 MED ORDER — LACTATED RINGERS IV SOLN
INTRAVENOUS | Status: DC
  Administered 2017-06-16: 16:00:00 via INTRAVENOUS

## 2017-06-16 MED ORDER — SUGAMMADEX SODIUM 500 MG/5ML IV SOLN
INTRAVENOUS | Status: DC | PRN
  Administered 2017-06-16: 400 mg via INTRAVENOUS

## 2017-06-16 MED ORDER — MIDAZOLAM HCL 2 MG/2ML IJ SOLN
INTRAMUSCULAR | Status: AC
  Filled 2017-06-16: qty 2

## 2017-06-16 MED ORDER — DEXAMETHASONE SODIUM PHOSPHATE 10 MG/ML IJ SOLN
INTRAMUSCULAR | Status: AC
  Filled 2017-06-16: qty 1

## 2017-06-16 MED ORDER — OXYCODONE HCL 5 MG PO TABS: 5.0000 mg | ORAL_TABLET | Freq: Once | ORAL | Status: DC | PRN

## 2017-06-16 MED ORDER — SODIUM CHLORIDE 0.9 % IV SOLN: 250.0000 mL | INTRAVENOUS | Status: DC | PRN

## 2017-06-16 MED ORDER — ROCURONIUM BROMIDE 10 MG/ML (PF) SYRINGE
PREFILLED_SYRINGE | INTRAVENOUS | Status: AC
Start: 2017-06-16 — End: ?
  Filled 2017-06-16: qty 5

## 2017-06-16 MED ORDER — DEXAMETHASONE SODIUM PHOSPHATE 4 MG/ML IJ SOLN
INTRAMUSCULAR | Status: DC | PRN
  Administered 2017-06-16: 10 mg via INTRAVENOUS

## 2017-06-16 MED ORDER — SCOPOLAMINE 1 MG/3DAYS TD PT72
1.0000 | MEDICATED_PATCH | Freq: Once | TRANSDERMAL | Status: DC | PRN
  Administered 2017-06-16: 1.5 mg via TRANSDERMAL

## 2017-06-16 MED ORDER — LACTATED RINGERS IV SOLN
INTRAVENOUS | Status: DC
  Administered 2017-06-16 (×2): via INTRAVENOUS

## 2017-06-16 MED ORDER — CEFAZOLIN SODIUM-DEXTROSE 2-4 GM/100ML-% IV SOLN
INTRAVENOUS | Status: AC
  Filled 2017-06-16: qty 100

## 2017-06-16 MED ORDER — DEXTROSE 5 % IV SOLN
INTRAVENOUS | Status: DC | PRN
  Administered 2017-06-16: 40 ug/min via INTRAVENOUS

## 2017-06-16 MED ORDER — BUPIVACAINE-EPINEPHRINE 0.25% -1:200000 IJ SOLN
INTRAMUSCULAR | Status: DC | PRN
  Administered 2017-06-16: 30 mL

## 2017-06-16 MED ORDER — HYDROMORPHONE HCL 1 MG/ML IJ SOLN
0.2500 mg | INTRAMUSCULAR | Status: DC | PRN
  Administered 2017-06-16: 0.25 mg via INTRAVENOUS
  Administered 2017-06-16 (×2): 0.5 mg via INTRAVENOUS

## 2017-06-16 MED ORDER — PHENYLEPHRINE HCL 10 MG/ML IJ SOLN
INTRAMUSCULAR | Status: DC | PRN
  Administered 2017-06-16: 80 ug via INTRAVENOUS

## 2017-06-16 MED ORDER — ONDANSETRON HCL 4 MG/2ML IJ SOLN
INTRAMUSCULAR | Status: AC
  Filled 2017-06-16: qty 2

## 2017-06-16 MED ORDER — SUFENTANIL CITRATE 50 MCG/ML IV SOLN
INTRAVENOUS | Status: AC
Start: 2017-06-16 — End: ?
  Filled 2017-06-16: qty 1

## 2017-06-16 MED ORDER — PHENYLEPHRINE 40 MCG/ML (10ML) SYRINGE FOR IV PUSH (FOR BLOOD PRESSURE SUPPORT)
PREFILLED_SYRINGE | INTRAVENOUS | Status: AC
  Filled 2017-06-16: qty 10

## 2017-06-16 MED ORDER — EPHEDRINE SULFATE 50 MG/ML IJ SOLN
INTRAMUSCULAR | Status: AC
  Filled 2017-06-16: qty 1

## 2017-06-16 MED ORDER — CEFAZOLIN SODIUM-DEXTROSE 2-4 GM/100ML-% IV SOLN
2.0000 g | INTRAVENOUS | Status: AC
  Administered 2017-06-16: 2 g via INTRAVENOUS

## 2017-06-16 MED ORDER — SODIUM CHLORIDE 0.9% FLUSH: 3.0000 mL | Freq: Two times a day (BID) | INTRAVENOUS | Status: DC

## 2017-06-16 MED ORDER — LIDOCAINE HCL (CARDIAC) PF 100 MG/5ML IV SOSY
PREFILLED_SYRINGE | INTRAVENOUS | Status: AC
  Filled 2017-06-16: qty 5

## 2017-06-16 MED ORDER — ROCURONIUM BROMIDE 100 MG/10ML IV SOLN
INTRAVENOUS | Status: DC | PRN
  Administered 2017-06-16: 80 mg via INTRAVENOUS
  Administered 2017-06-16: 20 mg via INTRAVENOUS

## 2017-06-16 SURGICAL SUPPLY — 65 items
BAG DECANTER FOR FLEXI CONT (MISCELLANEOUS) ×2 IMPLANT
BINDER BREAST LRG (GAUZE/BANDAGES/DRESSINGS) IMPLANT
BINDER BREAST MEDIUM (GAUZE/BANDAGES/DRESSINGS) IMPLANT
BINDER BREAST XLRG (GAUZE/BANDAGES/DRESSINGS) IMPLANT
BINDER BREAST XXLRG (GAUZE/BANDAGES/DRESSINGS) ×2 IMPLANT
BIOPATCH RED 1 DISK 7.0 (GAUZE/BANDAGES/DRESSINGS) IMPLANT
BLADE HEX COATED 2.75 (ELECTRODE) ×2 IMPLANT
BLADE KNIFE PERSONA 10 (BLADE) ×4 IMPLANT
BLADE SURG 15 STRL LF DISP TIS (BLADE) IMPLANT
BLADE SURG 15 STRL SS (BLADE)
BNDG GAUZE ELAST 4 BULKY (GAUZE/BANDAGES/DRESSINGS) ×4 IMPLANT
CANISTER SUCT 1200ML W/VALVE (MISCELLANEOUS) ×2 IMPLANT
CHLORAPREP W/TINT 26ML (MISCELLANEOUS) ×2 IMPLANT
COVER BACK TABLE 60X90IN (DRAPES) ×2 IMPLANT
COVER MAYO STAND STRL (DRAPES) ×2 IMPLANT
DECANTER SPIKE VIAL GLASS SM (MISCELLANEOUS) IMPLANT
DERMABOND ADVANCED (GAUZE/BANDAGES/DRESSINGS)
DERMABOND ADVANCED .7 DNX12 (GAUZE/BANDAGES/DRESSINGS) IMPLANT
DRAIN CHANNEL 19F RND (DRAIN) ×4 IMPLANT
DRAPE LAPAROSCOPIC ABDOMINAL (DRAPES) ×2 IMPLANT
DRSG PAD ABDOMINAL 8X10 ST (GAUZE/BANDAGES/DRESSINGS) ×4 IMPLANT
ELECT BLADE 4.0 EZ CLEAN MEGAD (MISCELLANEOUS)
ELECT REM PT RETURN 9FT ADLT (ELECTROSURGICAL) ×2
ELECTRODE BLDE 4.0 EZ CLN MEGD (MISCELLANEOUS) IMPLANT
ELECTRODE REM PT RTRN 9FT ADLT (ELECTROSURGICAL) ×1 IMPLANT
EVACUATOR SILICONE 100CC (DRAIN) ×4 IMPLANT
GAUZE SPONGE 4X4 12PLY STRL LF (GAUZE/BANDAGES/DRESSINGS) ×2 IMPLANT
GLOVE BIO SURGEON STRL SZ 6.5 (GLOVE) ×6 IMPLANT
GLOVE BIO SURGEON STRL SZ7 (GLOVE) ×2 IMPLANT
GLOVE SURG SS PI 7.0 STRL IVOR (GLOVE) ×4 IMPLANT
GOWN STRL REUS W/ TWL LRG LVL3 (GOWN DISPOSABLE) ×3 IMPLANT
GOWN STRL REUS W/ TWL XL LVL3 (GOWN DISPOSABLE) ×1 IMPLANT
GOWN STRL REUS W/TWL 2XL LVL3 (GOWN DISPOSABLE) ×2 IMPLANT
GOWN STRL REUS W/TWL LRG LVL3 (GOWN DISPOSABLE) ×3
GOWN STRL REUS W/TWL XL LVL3 (GOWN DISPOSABLE) ×1
NDL SAFETY ECLIPSE 18X1.5 (NEEDLE) IMPLANT
NEEDLE HYPO 18GX1.5 SHARP (NEEDLE)
NEEDLE HYPO 25X1 1.5 SAFETY (NEEDLE) ×2 IMPLANT
NS IRRIG 1000ML POUR BTL (IV SOLUTION) IMPLANT
PACK BASIN DAY SURGERY FS (CUSTOM PROCEDURE TRAY) ×2 IMPLANT
PAD ALCOHOL SWAB (MISCELLANEOUS) IMPLANT
PENCIL BUTTON HOLSTER BLD 10FT (ELECTRODE) ×2 IMPLANT
PIN SAFETY STERILE (MISCELLANEOUS) IMPLANT
SLEEVE SCD COMPRESS KNEE MED (MISCELLANEOUS) ×2 IMPLANT
SPONGE LAP 18X18 RF (DISPOSABLE) ×6 IMPLANT
STRIP SUTURE WOUND CLOSURE 1/2 (SUTURE) ×4 IMPLANT
SUT MNCRL AB 4-0 PS2 18 (SUTURE) ×6 IMPLANT
SUT MON AB 3-0 SH 27 (SUTURE) ×3
SUT MON AB 3-0 SH27 (SUTURE) ×3 IMPLANT
SUT MON AB 5-0 PS2 18 (SUTURE) ×6 IMPLANT
SUT PDS 3-0 CT2 (SUTURE)
SUT PDS AB 2-0 CT2 27 (SUTURE) IMPLANT
SUT PDS II 3-0 CT2 27 ABS (SUTURE) IMPLANT
SUT SILK 3 0 PS 1 (SUTURE) ×4 IMPLANT
SYR 3ML 23GX1 SAFETY (SYRINGE) ×2 IMPLANT
SYR 50ML LL SCALE MARK (SYRINGE) IMPLANT
SYR BULB IRRIGATION 50ML (SYRINGE) ×2 IMPLANT
SYR CONTROL 10ML LL (SYRINGE) ×2 IMPLANT
TAPE MEASURE VINYL STERILE (MISCELLANEOUS) ×2 IMPLANT
TOWEL OR 17X24 6PK STRL BLUE (TOWEL DISPOSABLE) ×4 IMPLANT
TUBE CONNECTING 20X1/4 (TUBING) ×2 IMPLANT
TUBING INFILTRATION IT-10001 (TUBING) IMPLANT
TUBING SET GRADUATE ASPIR 12FT (MISCELLANEOUS) IMPLANT
UNDERPAD 30X30 (UNDERPADS AND DIAPERS) ×4 IMPLANT
YANKAUER SUCT BULB TIP NO VENT (SUCTIONS) ×2 IMPLANT

## 2017-06-16 NOTE — Discharge Instructions (Signed)
No heavy lifting. Skin bath or baby wipes until drains out. Continue binder or sports bra.   Post Anesthesia Home Care Instructions  Activity: Get plenty of rest for the remainder of the day. A responsible individual must stay with you for 24 hours following the procedure.  For the next 24 hours, DO NOT: -Drive a car -Paediatric nurse -Drink alcoholic beverages -Take any medication unless instructed by your physician -Make any legal decisions or sign important papers.  Meals: Start with liquid foods such as gelatin or soup. Progress to regular foods as tolerated. Avoid greasy, spicy, heavy foods. If nausea and/or vomiting occur, drink only clear liquids until the nausea and/or vomiting subsides. Call your physician if vomiting continues.  Special Instructions/Symptoms: Your throat may feel dry or sore from the anesthesia or the breathing tube placed in your throat during surgery. If this causes discomfort, gargle with warm salt water. The discomfort should disappear within 24 hours.  If you had a scopolamine patch placed behind your ear for the management of post- operative nausea and/or vomiting:  1. The medication in the patch is effective for 72 hours, after which it should be removed.  Wrap patch in a tissue and discard in the trash. Wash hands thoroughly with soap and water. 2. You may remove the patch earlier than 72 hours if you experience unpleasant side effects which may include dry mouth, dizziness or visual disturbances. 3. Avoid touching the patch. Wash your hands with soap and water after contact with the patch.            JP Drain [employer] this sheet to all of your post-operative appointments while you have your drains.  Please measure your drains by CC's or ML's.  Make sure you drain and measure your JP Drains 2 or 3 times per day.  At the end of each day, add up totals for the left side and add up totals for the right side.    ( 9 am )     ( 3 pm  )        ( 9 pm )                Date L  R  L  R  L  R  Total L/R

## 2017-06-16 NOTE — Anesthesia Postprocedure Evaluation (Signed)
Anesthesia Post Note  Patient: [name]  Procedure(s) Performed: BILATERAL MAMMARY REDUCTION  (BREAST) (Bilateral )     Patient location during evaluation: PACU Anesthesia Type: General Level of consciousness: awake and alert Pain management: pain level controlled Vital Signs Assessment: post-procedure vital signs reviewed and stable Respiratory status: spontaneous breathing, nonlabored ventilation, respiratory function stable and patient connected to nasal cannula oxygen Cardiovascular status: blood pressure returned to baseline and stable Postop Assessment: no apparent nausea or vomiting Anesthetic complications: no    Last Vitals:  Vitals:   06/16/17 1636 06/16/17 1637  BP:    Pulse: 97 96  Resp: 11 14  Temp:    SpO2: 100% 96%                 Effie Berkshire

## 2017-06-16 NOTE — Anesthesia Procedure Notes (Signed)
Procedure Name: Intubation Date/Time: 06/16/2017 11:38 AM Performed by: Willa Frater, CRNA Pre-anesthesia Checklist: Patient identified, Emergency Drugs available, Suction available and Patient being monitored Patient Re-evaluated:Patient Re-evaluated prior to induction Oxygen Delivery Method: Circle system utilized Preoxygenation: Pre-oxygenation with 100% oxygen Induction Type: IV induction Ventilation: Mask ventilation without difficulty Laryngoscope Size: Mac and 3 Grade View: Grade I Tube type: Oral Tube size: 7.0 mm Number of attempts: 1 Airway Equipment and Method: Stylet and Oral airway Placement Confirmation: ETT inserted through vocal cords under direct vision,  positive ETCO2 and breath sounds checked- equal and bilateral Secured at: 23 cm Tube secured with: Tape Dental Injury: Teeth and Oropharynx as per pre-operative assessment

## 2017-06-16 NOTE — Anesthesia Preprocedure Evaluation (Signed)
Anesthesia Evaluation  Patient identified by MRN, date of birth, ID band Patient awake    Reviewed: Allergy & Precautions, NPO status , Patient's Chart, lab work & pertinent test results  Airway Mallampati: II  TM Distance: >3 FB Neck ROM: Full    Dental  (+) Teeth Intact, Dental Advisory Given   Pulmonary shortness of breath and with exertion,    breath sounds clear to auscultation       Cardiovascular negative cardio ROS   Rhythm:Regular Rate:Normal     Neuro/Psych  Headaches,  Neuromuscular disease negative psych ROS   GI/Hepatic Neg liver ROS, PUD, GERD  Medicated and Controlled,Ulcerative colitis   Endo/Other  Morbid obesity  Renal/GU negative Renal ROS  negative genitourinary   Musculoskeletal  (+) Arthritis , Osteoarthritis,    Abdominal   Peds negative pediatric ROS (+)  Hematology negative hematology ROS (+)   Anesthesia Other Findings   Reproductive/Obstetrics negative OB ROS                             Lab Results  Component Value Date   WBC 6.8 05/13/2017   HGB 13.5 05/13/2017   HCT 40.6 05/13/2017   MCV 67.9 (L) 05/13/2017   PLT 335 05/13/2017   Lab Results  Component Value Date   CREATININE 0.75 05/13/2017   BUN 9 05/13/2017   NA 142 05/13/2017   K 4.1 05/13/2017   CL 104 05/13/2017   CO2 26 05/13/2017   Lab Results  Component Value Date   INR 1.06 01/03/2015   EKG: NSR   Anesthesia Physical  Anesthesia Plan  ASA: III  Anesthesia Plan: General   Post-op Pain Management:    Induction: Intravenous  PONV Risk Score and Plan:   Airway Management Planned: Oral ETT  Additional Equipment:   Intra-op Plan:   Post-operative Plan: Extubation in OR  Informed Consent: I have reviewed the patients History and Physical, chart, labs and discussed the procedure including the risks, benefits and alternatives for the proposed anesthesia with the patient or  authorized representative who has indicated his/her understanding and acceptance.   Dental advisory given  Plan Discussed with: CRNA  Anesthesia Plan Comments:         Anesthesia Quick Evaluation

## 2017-06-16 NOTE — Op Note (Signed)
Breast Reduction Op note:    DATE OF PROCEDURE: 06/16/2017  LOCATION: [facility]  SURGEON: Lyndee Leo Sanger Damian Buckles, DO  ASSISTANT: Shawn Rayburn, PA  PREOPERATIVE DIAGNOSIS 1. Macromastia 2. Neck Pain 3. Back Pain  POSTOPERATIVE DIAGNOSIS 1. Macromastia 2. Neck Pain 3. Back Pain  PROCEDURES 1. Bilateral breast reduction.  Right reduction 1030g, Left reduction 225Y  COMPLICATIONS: None.  DRAINS: bilateral  INDICATIONS FOR PROCEDURE [name] is a 62 y.o. year-old female born on 1955-08-28,with a history of symptomatic macromastia with concominant back pain, neck pain, shoulder grooving from her bra.   MRN: [id number]  CONSENT Informed consent was obtained directly from the patient. The risks, benefits and alternatives were fully discussed. Specific risks including but not limited to bleeding, infection, hematoma, seroma, scarring, pain, nipple necrosis, asymmetry, poor cosmetic results, and need for further surgery were discussed. The patient had ample opportunity to have her questions answered to her satisfaction.  DESCRIPTION OF PROCEDURE  Patient was brought into the operating room and placed in a supine position.  SCDs were placed and appropriate padding was performed.  Antibiotics were given. The patient underwent general anesthesia and the chest was prepped and draped in a sterile fashion.  A timeout was performed and all information was confirmed to be correct.  Right side: Preoperative markings were confirmed.  Incision lines were injected with 1% Xylocaine with epinephrine.  After waiting for vasoconstriction, the marked lines were incised.  A Wise-pattern superomedial breast reduction was performed by de-epithelializing the pedicle, using bovie to create the superomedial pedicle, and removing breast tissue from the superior, lateral, and inferior portions of the breast.  Care was taken to not undermine the breast pedicle. Hemostasis was  achieved.  The nipple was gently rotated into position and the soft tissue closed with 4-0 Monocryl.   The pocket was irrigated and hemostasis confirmed.  The deep tissues were approximated with 3-0 monocryl sutures and the skin was closed with deep dermal and subcuticular 4-0 Monocryl sutures.  The nipple and skin flaps had good capillary refill at the end of the procedure.    Left side: Preoperative markings were confirmed.  Incision lines were injected with 1% Xylocaine with epinephrine.  After waiting for vasoconstriction, the marked lines were incised.  A Wise-pattern superomedial breast reduction was performed by de-epithelializing the pedicle, using bovie to create the superomedial pedicle, and removing breast tissue from the superior, lateral, and inferior portions of the breast.  Care was taken to not undermine the breast pedicle. Hemostasis was achieved.  The nipple was gently rotated into position and the soft tissue was closed with 4-0 Monocryl.  The patient was sat upright and size and shape symmetry was confirmed.  The pocket was irrigated and hemostasis confirmed.  The deep tissues were approximated with 3-0 monocryl sutures and the skin was closed with deep dermal and subcuticular 4-0 Monocryl sutures.  Dermabond was applied.  A breast binder and ABDs were placed.  The nipple and skin flaps had good capillary refill at the end of the procedure.  The patient tolerated the procedure well. The patient was allowed to wake from anesthesia and taken to the recovery room in satisfactory condition

## 2017-06-16 NOTE — Transfer of Care (Signed)
Immediate Anesthesia Transfer of Care Note  Patient: [name]  Procedure(s) Performed: BILATERAL MAMMARY REDUCTION  (BREAST) (Bilateral )  Patient Location: PACU  Anesthesia Type:General  Level of Consciousness: awake, alert  and oriented  Airway & Oxygen Therapy: Patient Spontanous Breathing and Patient connected to face mask oxygen  Post-op Assessment: Report given to RN and Post -op Vital signs reviewed and stable  Post vital signs: Reviewed and stable  Last Vitals:  Vitals Value Taken Time  BP 120/67 06/16/2017  3:17 PM  Temp    Pulse 102 06/16/2017  3:20 PM  Resp 14 06/16/2017  3:20 PM  SpO2 100 % 06/16/2017  3:20 PM  Vitals shown include unvalidated device data.  Last Pain:  Vitals:   06/16/17 1009  TempSrc: Oral  PainSc: 0-No pain         Complications: No apparent anesthesia complications

## 2017-06-17 ENCOUNTER — Encounter (HOSPITAL_BASED_OUTPATIENT_CLINIC_OR_DEPARTMENT_OTHER): Payer: Self-pay | Admitting: Plastic Surgery

## 2017-06-17 NOTE — Op Note (Signed)
First Assist Op Note:  I assisted the Surgeon(s) ___Dr. Lyndee Leo Dillingham____ on the procedure(s): ____Bilateral Breast reduction____on Date __5/02/19_______  I provided my assistance on this case as follows:  I was present and acted as first Environmental consultant during this operation. I was present during the patient transport into the operative suite and assisted the OR staff with transferring and positioning of the patient. All extremities were checked and properly cushioned and safety straps in place. I was involved in the prepping and placement of sterile drapes. A time out was performed and all information confirmed to be correct.  I first assisted during the case including retraction for exposure, assisting with closure of surgical wounds and application of sterile dressings. I provided assistance with application of post operative garments/splinting and assisted with patient transfer back to the stretcher as needed.   Jeanpaul Biehl,PA-C Plastic Surgery [phone]

## 2017-06-23 DIAGNOSIS — Z9889 Other specified postprocedural states: Secondary | ICD-10-CM | POA: Insufficient documentation

## 2017-07-10 ENCOUNTER — Other Ambulatory Visit: Payer: Self-pay | Admitting: Internal Medicine

## 2017-07-15 DIAGNOSIS — M1711 Unilateral primary osteoarthritis, right knee: Secondary | ICD-10-CM | POA: Insufficient documentation

## 2017-07-19 ENCOUNTER — Telehealth: Payer: Self-pay | Admitting: Internal Medicine

## 2017-07-19 ENCOUNTER — Other Ambulatory Visit: Payer: Self-pay | Admitting: Internal Medicine

## 2017-07-19 MED ORDER — PANTOPRAZOLE SODIUM 40 MG PO TBEC: 40.0000 mg | DELAYED_RELEASE_TABLET | Freq: Every day | ORAL | 1 refills | Status: DC

## 2017-07-19 NOTE — Telephone Encounter (Signed)
Rx sent 

## 2017-07-20 MED ORDER — PANTOPRAZOLE SODIUM 40 MG PO TBEC: 40.0000 mg | DELAYED_RELEASE_TABLET | Freq: Every day | ORAL | 0 refills | Status: DC

## 2017-07-20 NOTE — Telephone Encounter (Signed)
Pt is asking for a 90 days supply of [facility]oprazole be [facility] to [facility] [address].

## 2017-07-20 NOTE — Addendum Note (Signed)
Addended by: Larina Bras on: 07/20/2017 05:03 PM   Modules accepted: Orders

## 2017-09-07 ENCOUNTER — Other Ambulatory Visit: Payer: Self-pay | Admitting: Internal Medicine

## 2017-09-07 ENCOUNTER — Encounter: Payer: Self-pay | Admitting: *Deleted

## 2017-09-28 ENCOUNTER — Telehealth: Payer: Self-pay | Admitting: Internal Medicine

## 2017-09-28 NOTE — Telephone Encounter (Signed)
Suggested pt try to obtain cobra through her job. There are no samples of these medications. Pt could also contact the drug company to see if she could qualify for pt assistance. Also suggested she could try switching to omeprazole to try and get the prescription for 5 dollars at [facility]. Instructed pt to call back with questions.

## 2017-10-04 ENCOUNTER — Ambulatory Visit: Payer: BLUE CROSS/BLUE SHIELD | Admitting: Internal Medicine

## 2017-10-16 ENCOUNTER — Other Ambulatory Visit: Payer: Self-pay | Admitting: Internal Medicine

## 2017-11-23 ENCOUNTER — Ambulatory Visit: Payer: Self-pay | Admitting: Internal Medicine

## 2018-08-08 ENCOUNTER — Encounter: Payer: Self-pay | Admitting: *Deleted

## 2018-09-15 ENCOUNTER — Encounter: Payer: Self-pay | Admitting: Internal Medicine

## 2019-02-19 ENCOUNTER — Telehealth: Payer: Self-pay | Admitting: General Practice

## 2019-02-19 ENCOUNTER — Telehealth: Payer: Self-pay | Admitting: Family Medicine

## 2019-02-19 NOTE — Telephone Encounter (Signed)
Called and spoke with pt. She is going to see Einar Pheasant tomorrow at 11:30am. She is going to increase her fluid intake. States that this is the first migraine she has had in awhile. Top of her head, Has not check her BP, denies any other symptoms besides the HA. Typically only takes Tylenol OTC. Appt made with PA as PCP is full.

## 2019-02-19 NOTE — Telephone Encounter (Signed)
Paperwork given to PCP for completion.  

## 2019-02-19 NOTE — Telephone Encounter (Signed)
I have placed a treating statement in the bin up front with a charge sheet

## 2019-02-19 NOTE — Telephone Encounter (Signed)
Received an after hours note stating that pt has been having increased migraines. Ok for a VV to discuss?

## 2019-02-19 NOTE — Telephone Encounter (Signed)
Yes, pt needs virtual visit

## 2019-02-20 ENCOUNTER — Encounter: Payer: Self-pay | Admitting: Physician Assistant

## 2019-02-20 ENCOUNTER — Telehealth: Payer: Self-pay

## 2019-02-20 ENCOUNTER — Ambulatory Visit (INDEPENDENT_AMBULATORY_CARE_PROVIDER_SITE_OTHER): Payer: Self-pay | Admitting: Physician Assistant

## 2019-02-20 ENCOUNTER — Other Ambulatory Visit: Payer: Self-pay

## 2019-02-20 DIAGNOSIS — Z20822 Contact with and (suspected) exposure to covid-19: Secondary | ICD-10-CM

## 2019-02-20 DIAGNOSIS — G43009 Migraine without aura, not intractable, without status migrainosus: Secondary | ICD-10-CM

## 2019-02-20 MED ORDER — BENZONATATE 100 MG PO CAPS: 100.0000 mg | ORAL_CAPSULE | Freq: Three times a day (TID) | ORAL | 0 refills | Status: DC | PRN

## 2019-02-20 MED ORDER — RIZATRIPTAN BENZOATE 10 MG PO TABS: 10.0000 mg | ORAL_TABLET | Freq: Once | ORAL | 0 refills | Status: DC

## 2019-02-20 NOTE — Progress Notes (Signed)
I have discussed the procedure for the virtual visit with the patient who has given consent to proceed with assessment and treatment.   [name], CMA     

## 2019-02-20 NOTE — Telephone Encounter (Signed)
(  [place] with [employer] called in to double check is we have received any disability paperwork for patient.

## 2019-02-20 NOTE — Patient Instructions (Signed)
Instructions sent to MyChart.  Please go to [email or web address] to schedule a Covid test.  If none of the days/times work for you, please [facility].  Please quarantine until results are in.  Increase fluid intake and get plenty of rest. Continue Mucinex twice daily. Tylenol for headache and aches. Use the Tessalon sent in to help with cough. The Maxalt should help abort the migraine headache.  Please complete the Covid symptom questionnaires daily so that we can keep a track of you.  ER for any acutely worsening symptoms.

## 2019-02-20 NOTE — Progress Notes (Signed)
Virtual Visit via Video   I connected with patient on 02/20/19 at 11:30 AM EST by a video enabled telemedicine application and verified that I am speaking with the correct person using two identifiers.  Location patient: Home Location provider: Fern[facility] Bras, Office Persons participating in the virtual visit: Patient, Provider, [place] (Patina Moore)  I discussed the limitations of evaluation and management by telemedicine and the availability of in person appointments. The patient expressed understanding and agreed to proceed.  Subjective:   HPI:   Patient presents via doxy doxy.me today complaining of 3 days of nasal congestion, cough and chest congestion, now productive of clear to white sputum.  Also notes chest tightness without true shortness of breath.  Denies fever, chills, chest pain, aches, GI symptoms or loss of smell and taste.  Does endorse a migraine headache x2-1/2 days.  States this feels like her typical migraine.  Denies nausea or vomiting.  Has had multiple contacts with Covid.  Daughter with similar symptoms who is going to get tested for Covid today.  Patient is taking Mucinex for symptoms.  Has not taken anything else.  ROS:   See pertinent positives and negatives per HPI.  Patient Active Problem List   Diagnosis Date Noted  . Osteoarthritis of right knee 07/15/2017  . Status post bilateral breast reduction 06/23/2017  . Morbid obesity ([place]) 11/08/2016  . Vitamin D deficiency 11/08/2016  . Back pain 01/28/2016  . S/P left TKA 01/14/2015  . S/P knee replacement 01/14/2015  . Thoracic back pain 04/05/2014  . Beta thalassemia, heterozygous 03/11/2014  . Right lumbar radiculopathy 11/23/2013  . Ulcerative colitis, universal ([place]) 07/25/2012  . THYROIDITIS 01/27/2010  . MIGRAINE HEADACHE 01/27/2010  . ALLERGIC RHINITIS 12/31/2009  . ASTHMA 12/31/2009  . PALPITATIONS 12/31/2009  . SHORTNESS OF BREATH 12/31/2009    Social History   Tobacco Use  .  Smoking status: Never Smoker  . Smokeless tobacco: Never Used  Substance Use Topics  . Alcohol use: No    Alcohol/week: 0.0 standard drinks    Current Outpatient Medications:  .  calcium carbonate (TUMS FRESHERS) 500 MG chewable tablet, Chew 1 tablet by mouth daily. As needed, Disp: , Rfl:  .  DUEXIS 800-26.6 MG TABS, Take 1 tablet by mouth 2 (two) times daily as needed (for pain/inflammation). , Disp: , Rfl:  .  mesalamine (LIALDA) 1.2 g EC tablet, TAKE 2 TABLETS (2.4 G TOTAL) BY MOUTH DAILY., Disp: 180 tablet, Rfl: 0 .  pantoprazole (PROTONIX) 40 MG tablet, TAKE 1 TABLET BY MOUTH EVERY DAY, Disp: 90 tablet, Rfl: 0 .  rizatriptan (MAXALT) 10 MG tablet, Take 1 tablet (10 mg total) by mouth as needed for migraine. May repeat in 2 hours if needed, Disp: 10 tablet, Rfl: 3  Allergies  Allergen Reactions  . No Known Allergies     Objective:   There were no vitals taken for this visit.  Patient is well-developed, well-nourished in no acute distress.  Resting comfortably at home.  Head is normocephalic, atraumatic.  No labored breathing.  Speech is clear and coherent with logical content.  Patient is alert and oriented at baseline.   Assessment and Plan:   1. Migraine without aura and without status migrainosus, not intractable Patient previously on Maxalt for abortive therapy with good success.  Maxalt 10 mg refilled.  Supportive measures and OTC medications reviewed.  2. Suspected COVID-19 virus infection Patient given information on testing.  Patient has been enrolled in MyChart symptom monitoring program.  She is quarantine until test results are in.  Continue Mucinex twice daily.  Increase fluids, rest.  Tylenol if needed.  Rx Tessalon for cough.  Strict ER precautions reviewed with patient who voiced understanding and agreement with plan. - MyChart COVID-19 home monitoring program; Future - Temperature monitoring; Future    Leeanne Rio, PA-C 02/20/2019

## 2019-02-21 ENCOUNTER — Ambulatory Visit: Payer: Medicaid Other | Attending: Internal Medicine

## 2019-02-21 DIAGNOSIS — U071 COVID-19: Secondary | ICD-10-CM | POA: Insufficient documentation

## 2019-02-21 DIAGNOSIS — Z20822 Contact with and (suspected) exposure to covid-19: Secondary | ICD-10-CM

## 2019-02-22 NOTE — Telephone Encounter (Signed)
Unable to complete paperwork.  This is paperwork for SSI disability (which I don't do) and I have not seen patient since March 2019.

## 2019-02-22 NOTE — Telephone Encounter (Signed)
We have but I have not seen pt since March 2019 and I am not able to complete disability paperwork

## 2019-02-22 NOTE — Telephone Encounter (Signed)
I have made pt aware of this and faxed the form to the # provided on the form making them aware that [employer] doesn't do disability

## 2019-02-22 NOTE — Telephone Encounter (Signed)
FYI. Papers are in basket in back.

## 2019-02-23 LAB — NOVEL CORONAVIRUS, NAA: SARS-CoV-2, NAA: DETECTED — AB

## 2019-02-23 NOTE — Telephone Encounter (Signed)
Spoke with patient, advised patient to contact [facility] with [employer] for furthe instructions

## 2019-02-24 ENCOUNTER — Telehealth: Payer: Self-pay | Admitting: Nurse Practitioner

## 2019-02-24 ENCOUNTER — Encounter: Payer: Self-pay | Admitting: Nurse Practitioner

## 2019-02-24 DIAGNOSIS — Z6839 Body mass index (BMI) 39.0-39.9, adult: Secondary | ICD-10-CM | POA: Insufficient documentation

## 2019-02-24 NOTE — Telephone Encounter (Signed)
  I connected by phone with [name] on 02/24/2019 at 12:30 PM to discuss the potential use of an new treatment for mild to moderate COVID-19 viral infection in non-hospitalized patients.  Symptoms started 02/17/2019, which places her at 7 days today.  Will be 9 days on 02/26/2019.  Continues to have cough and weakness.  Discussed with her Monday and Tuesday infusion center schedule full, she would like to be placed on wait list.  Discussed that at 11+ the treatment is not as beneficial and can worsen symptoms.  This patient is a 64 y.o. female that meets the FDA criteria for Emergency Use Authorization of bamlanivimab or casirivimab\imdevimab.  Has a (+) direct SARS-CoV-2 viral test result  Has mild or moderate COVID-19   Is ? 62 years of age and weighs ? 40 kg  Is NOT hospitalized due to COVID-19  Is NOT requiring oxygen therapy or requiring an increase in baseline oxygen flow rate due to COVID-19  Is within 10 days of symptom onset  Has at least one of the high risk factor(s) for progression to severe COVID-19 and/or hospitalization as defined in EUA.  Specific high risk criteria : BMI >/= 35   I have spoken and communicated the following to the patient or parent/caregiver:  1. FDA has authorized the emergency use of bamlanivimab and casirivimab\imdevimab for the treatment of mild to moderate COVID-19 in adults and pediatric patients with positive results of direct SARS-CoV-2 viral testing who are 61 years of age and older weighing at least 40 kg, and who are at high risk for progressing to severe COVID-19 and/or hospitalization.  2. The significant known and potential risks and benefits of bamlanivimab and casirivimab\imdevimab, and the extent to which such potential risks and benefits are unknown.  3. Information on available alternative treatments and the risks and benefits of those alternatives, including clinical trials.  4. Patients treated with bamlanivimab and  casirivimab\imdevimab should continue to self-isolate and use infection control measures (e.g., wear mask, isolate, social distance, avoid sharing personal items, clean and disinfect "high touch" surfaces, and frequent handwashing) according to CDC guidelines.   5. The patient or parent/caregiver has the option to accept or refuse bamlanivimab or casirivimab\imdevimab .  After reviewing this information with the patient, patient has been placed on wait list for 02/26/2019 or 02/27/2019 for BAM.  No orders placed, will place if infusion able to be performed. Bemnet Trovato T Sabel Hornbeck 02/24/2019 12:30 PM

## 2019-02-25 ENCOUNTER — Ambulatory Visit (HOSPITAL_COMMUNITY): Payer: Medicaid Other

## 2019-02-26 ENCOUNTER — Other Ambulatory Visit: Payer: Self-pay | Admitting: Nurse Practitioner

## 2019-02-26 ENCOUNTER — Ambulatory Visit (HOSPITAL_COMMUNITY)
Admission: RE | Admit: 2019-02-26 | Discharge: 2019-02-26 | Disposition: A | Discharge status: Home / Self Care | Payer: HRSA Program | Source: Ambulatory Visit | Attending: Pulmonary Disease | Admitting: Pulmonary Disease

## 2019-02-26 DIAGNOSIS — U071 COVID-19: Secondary | ICD-10-CM

## 2019-02-26 MED ORDER — ALBUTEROL SULFATE HFA 108 (90 BASE) MCG/ACT IN AERS: 2.0000 | INHALATION_SPRAY | Freq: Once | RESPIRATORY_TRACT | Status: DC | PRN

## 2019-02-26 MED ORDER — FAMOTIDINE IN NACL 20-0.9 MG/50ML-% IV SOLN: 20.0000 mg | Freq: Once | INTRAVENOUS | Status: DC | PRN

## 2019-02-26 MED ORDER — METHYLPREDNISOLONE SODIUM SUCC 125 MG IJ SOLR: 125.0000 mg | Freq: Once | INTRAMUSCULAR | Status: DC | PRN

## 2019-02-26 MED ORDER — SODIUM CHLORIDE 0.9 % IV SOLN
700.0000 mg | Freq: Once | INTRAVENOUS | Status: AC
  Administered 2019-02-26: 700 mg via INTRAVENOUS
  Filled 2019-02-26: qty 20

## 2019-02-26 MED ORDER — EPINEPHRINE 0.3 MG/0.3ML IJ SOAJ: 0.3000 mg | Freq: Once | INTRAMUSCULAR | Status: DC | PRN

## 2019-02-26 MED ORDER — DIPHENHYDRAMINE HCL 50 MG/ML IJ SOLN: 50.0000 mg | Freq: Once | INTRAMUSCULAR | Status: DC | PRN

## 2019-02-26 MED ORDER — SODIUM CHLORIDE 0.9 % IV SOLN
INTRAVENOUS | Status: DC | PRN
  Administered 2019-02-26: 250 mL via INTRAVENOUS

## 2019-02-26 NOTE — Progress Notes (Signed)
  Diagnosis: COVID-19  Physician: Dr. Joya Gaskins  Procedure: Cov[facility] Infusion Clinic Med: bamlanivimab infusion - Provided patient with bamlanimivab fact sheet for patients, parents and caregivers prior to infusion.  Complications: No immediate complications noted.  Discharge: Discharged home   Tia Masker 02/26/2019

## 2019-02-26 NOTE — Discharge Instructions (Signed)

## 2020-02-21 ENCOUNTER — Other Ambulatory Visit: Payer: Self-pay | Admitting: Physician Assistant

## 2020-02-26 ENCOUNTER — Other Ambulatory Visit: Payer: Self-pay

## 2020-02-26 ENCOUNTER — Encounter: Payer: Self-pay | Admitting: Physician Assistant

## 2020-02-26 ENCOUNTER — Telehealth (INDEPENDENT_AMBULATORY_CARE_PROVIDER_SITE_OTHER): Payer: Medicare Other | Admitting: Physician Assistant

## 2020-02-26 DIAGNOSIS — J208 Acute bronchitis due to other specified organisms: Secondary | ICD-10-CM

## 2020-02-26 DIAGNOSIS — B9689 Other specified bacterial agents as the cause of diseases classified elsewhere: Secondary | ICD-10-CM

## 2020-02-26 MED ORDER — AZITHROMYCIN 250 MG PO TABS: ORAL_TABLET | ORAL | 0 refills | Status: DC

## 2020-02-26 MED ORDER — BENZONATATE 100 MG PO CAPS: 100.0000 mg | ORAL_CAPSULE | Freq: Three times a day (TID) | ORAL | 0 refills | Status: DC | PRN

## 2020-02-26 NOTE — Patient Instructions (Signed)
Instructions sent to MyChart

## 2020-02-26 NOTE — Progress Notes (Signed)
I have discussed the procedure for the virtual visit with the patient who has given consent to proceed with assessment and treatment.   Tayshaun Kroh S Francis Yardley, CMA     

## 2020-02-26 NOTE — Progress Notes (Signed)
   Virtual Visit via Video   I connected with patient on 02/26/20 at  3:00 PM EST by a video enabled telemedicine application and verified that I am speaking with the correct person using two identifiers.  Location patient: Home Location provider: Fernande Bras, Office Persons participating in the virtual visit: Patient, Provider, [place] (Patina Moore)  I discussed the limitations of evaluation and management by telemedicine and the availability of in person appointments. The patient expressed understanding and agreed to proceed.  Subjective:   HPI:   Patient presents via Caregility today c/o 1 week of chest congestion, fatigue and cough. Initially with chills, nasal congestion and wheezing that has resolved. Denies fever, chest pain or SOB. Denies loss of taste or smell. Denies ear pain or tooth pain. Feels cough has worsened from onset. Is up-to-date on COVID vaccines. Denies recent travel or sick contact. Has taken Mucinex with slight relief in some symptoms.   ROS:   See pertinent positives and negatives per HPI.  Patient Active Problem List   Diagnosis Date Noted  . BMI 39.0-39.9,adult 02/24/2019  . Osteoarthritis of right knee 07/15/2017  . Status post bilateral breast reduction 06/23/2017  . Morbid obesity ([place]) 11/08/2016  . Vitamin D deficiency 11/08/2016  . Back pain 01/28/2016  . S/P left TKA 01/14/2015  . S/P knee replacement 01/14/2015  . Thoracic back pain 04/05/2014  . Beta thalassemia, heterozygous 03/11/2014  . Right lumbar radiculopathy 11/23/2013  . Ulcerative colitis, universal ([place]) 07/25/2012  . THYROIDITIS 01/27/2010  . MIGRAINE HEADACHE 01/27/2010  . ALLERGIC RHINITIS 12/31/2009  . ASTHMA 12/31/2009  . PALPITATIONS 12/31/2009  . SHORTNESS OF BREATH 12/31/2009    Social History   Tobacco Use  . Smoking status: Never Smoker  . Smokeless tobacco: Never Used  Substance Use Topics  . Alcohol use: No    Alcohol/week: 0.0 standard drinks     Current Outpatient Medications:  .  calcium carbonate (TUMS - DOSED IN MG ELEMENTAL CALCIUM) 500 MG chewable tablet, Chew 1 tablet by mouth daily. As needed, Disp: , Rfl:  .  diclofenac (VOLTAREN) 75 MG EC tablet, Take 75 mg by mouth 2 (two) times daily., Disp: , Rfl:  .  omeprazole (PRILOSEC) 20 MG capsule, Take 20 mg by mouth daily., Disp: , Rfl:   Allergies  Allergen Reactions  . No Known Allergies     Objective:   There were no vitals taken for this visit.  Patient is well-developed, well-nourished in no acute distress.  Resting comfortably at home.  Head is normocephalic, atraumatic.  No labored breathing.  Speech is clear and coherent with logical content.  Patient is alert and oriented at baseline.   Assessment and Plan:   1. Acute bacterial bronchitis Rx Azithromycin.  Increase fluids.  Rest.  Saline nasal spray.  Probiotic.  Mucinex as directed.  Humidifier in bedroom. Tessalon per orders.  Call or return to clinic if symptoms are not improving.     Leeanne Rio, PA-C 02/26/2020

## 2020-03-31 DIAGNOSIS — M1711 Unilateral primary osteoarthritis, right knee: Secondary | ICD-10-CM | POA: Diagnosis not present

## 2020-03-31 DIAGNOSIS — M25562 Pain in left knee: Secondary | ICD-10-CM | POA: Diagnosis not present

## 2020-04-10 ENCOUNTER — Other Ambulatory Visit (HOSPITAL_COMMUNITY): Payer: Self-pay | Admitting: Orthopedic Surgery

## 2020-04-10 ENCOUNTER — Other Ambulatory Visit: Payer: Self-pay | Admitting: Orthopedic Surgery

## 2020-04-10 DIAGNOSIS — Z96652 Presence of left artificial knee joint: Secondary | ICD-10-CM

## 2020-04-11 ENCOUNTER — Encounter: Payer: Self-pay | Admitting: Family Medicine

## 2020-04-11 ENCOUNTER — Other Ambulatory Visit: Payer: Self-pay

## 2020-04-11 ENCOUNTER — Ambulatory Visit (INDEPENDENT_AMBULATORY_CARE_PROVIDER_SITE_OTHER): Payer: Medicare Other | Admitting: Family Medicine

## 2020-04-11 VITALS — BP 118/78 | HR 81 | Temp 97.6°F | Resp 17 | Ht 60.0 in | Wt 214.4 lb

## 2020-04-11 DIAGNOSIS — Z0000 Encounter for general adult medical examination without abnormal findings: Secondary | ICD-10-CM

## 2020-04-11 DIAGNOSIS — Z1231 Encounter for screening mammogram for malignant neoplasm of breast: Secondary | ICD-10-CM | POA: Diagnosis not present

## 2020-04-11 DIAGNOSIS — E559 Vitamin D deficiency, unspecified: Secondary | ICD-10-CM | POA: Diagnosis not present

## 2020-04-11 MED ORDER — SUMATRIPTAN SUCCINATE 100 MG PO TABS: 100.0000 mg | ORAL_TABLET | ORAL | 6 refills | Status: DC | PRN

## 2020-04-11 NOTE — Addendum Note (Signed)
Addended by: Genevie Cheshire L on: 04/11/2020 02:45 PM   Modules accepted: Orders

## 2020-04-11 NOTE — Patient Instructions (Addendum)
Follow up in 6 months to recheck weight loss progress We'll notify you of your lab results and make any changes if needed Continue to work on healthy diet and regular exercise- you can do it! Make sure you wear a good supportive bra to help w/ the chest wall pain/breast pain We'll call you with your mammogram appt Call with any questions or concerns Stay Safe!  Stay Healthy!!!

## 2020-04-11 NOTE — Assessment & Plan Note (Signed)
BMI is 41.87  She is down 8 lbs since last visit.  Applauded her efforts.  Check labs to risk stratify.  Will follow.

## 2020-04-11 NOTE — Assessment & Plan Note (Signed)
Pt's PE WNL w/ exception of obesity.  Mammogram ordered.  Pt has f/u w/ GI scheduled.  UTD on Tdap, COVID.  Check labs.  Anticipatory guidance provided.

## 2020-04-11 NOTE — Addendum Note (Signed)
Addended by: Genevie Cheshire L on: 04/11/2020 03:39 PM   Modules accepted: Orders

## 2020-04-11 NOTE — Progress Notes (Signed)
° °  Subjective:    Patient ID: [name], female    DOB: [birth year], 65 y.o.   MRN: [id number]  HPI CPE- due for mammo.  UTD on Tdap, COVID.  Declines flu.  Overdue for repeat colonoscopy- has appt w/ Dr Hilarie Fredrickson next month  Reviewed past medical, surgical, family and social histories.   Patient Care Team    Relationship Specialty Notifications Start End  Midge Minium, MD PCP - General   01/21/10   Pyrtle, Lajuan Lines, MD Consulting Physician Gastroenterology  05/09/15   Suella Broad, MD Consulting Physician Physical Medicine and Rehabilitation  05/09/15     Health Maintenance  Topic Date Due   INFLUENZA VACCINE  05/15/2020 (Originally 09/16/2019)   COLONOSCOPY (Pts 45-39yr Insurance coverage will need to be confirmed)  05/15/2020 (Originally 10/22/2018)   MAMMOGRAM  06/23/2020 (Originally 01/19/2018)   COVID-19 Vaccine (3 - Booster for [place]) 06/23/2020 (Originally 12/27/2019)   Hepatitis C Screening  04/11/2021 (Originally 102-25-1957   HIV Screening  04/11/2021 (Originally 12/09/1970)   TETANUS/TDAP  10/25/2023     Review of Systems Patient reports no vision/ hearing changes, adenopathy,fever, weight change,  persistant/recurrent hoarseness , swallowing issues, palpitations, edema, persistant/recurrent cough, hemoptysis, dyspnea (rest/exertional/paroxysmal nocturnal), gastrointestinal bleeding (melena, rectal bleeding), abdominal pain, significant heartburn, bowel changes, GU symptoms (dysuria, hematuria, incontinence), Gyn symptoms (abnormal  bleeding, pain),  syncope, focal weakness, memory loss, numbness & tingling, skin/hair/nail changes, abnormal bruising or bleeding, anxiety, or depression.   R sided chest 'discomfort'- worsens in the evenings, gets more intense when lying down.  Chest TTP  This visit occurred during the SARS-CoV-2 public health emergency.  Safety protocols were in place, including screening questions prior to the visit, additional usage of staff  PPE, and extensive cleaning of exam room while observing appropriate contact time as indicated for disinfecting solutions.       Objective:   Physical Exam General Appearance:    Alert, cooperative, no distress, appears stated age, obese  Head:    Normocephalic, without obvious abnormality, atraumatic  Eyes:    PERRL, conjunctiva/corneas clear, EOM's intact, fundi    benign, both eyes  Ears:    Normal TM's and external ear canals, both ears  Nose:   Deferred due to COVID  Throat:   Neck:   Supple, symmetrical, trachea midline, no adenopathy;    Thyroid: no enlargement/tenderness/nodules  Back:     Symmetric, no curvature, ROM normal, no CVA tenderness  Lungs:     Clear to auscultation bilaterally, respirations unlabored  Chest Wall:    TTP over R anterior chest wall consistent w/ suspensory ligament pain   Heart:    Regular rate and rhythm, S1 and S2 normal, no murmur, rub   or gallop  Breast Exam:    Deferred to mammo  Abdomen:     Soft, non-tender, bowel sounds active all four quadrants,    no masses, no organomegaly  Genitalia:    Deferred  Rectal:    Extremities:   Extremities normal, atraumatic, no cyanosis or edema  Pulses:   2+ and symmetric all extremities  Skin:   Skin color, texture, turgor normal, no rashes or lesions  Lymph nodes:   Cervical, supraclavicular, and axillary nodes normal  Neurologic:   CNII-XII intact, normal strength, sensation and reflexes    throughout          Assessment & Plan:

## 2020-04-11 NOTE — Assessment & Plan Note (Signed)
Pt has hx of similar.  Check labs.  Replete prn.

## 2020-04-14 ENCOUNTER — Other Ambulatory Visit (INDEPENDENT_AMBULATORY_CARE_PROVIDER_SITE_OTHER): Payer: Medicare Other

## 2020-04-14 DIAGNOSIS — E559 Vitamin D deficiency, unspecified: Secondary | ICD-10-CM

## 2020-04-14 DIAGNOSIS — Z0000 Encounter for general adult medical examination without abnormal findings: Secondary | ICD-10-CM

## 2020-04-14 LAB — CBC WITH DIFFERENTIAL/PLATELET
Basophils Absolute: 0.2 10*3/uL — ABNORMAL HIGH (ref 0.0–0.1)
Basophils Relative: 1.9 % (ref 0.0–3.0)
Eosinophils Absolute: 0 10*3/uL (ref 0.0–0.7)
Eosinophils Relative: 0.2 % (ref 0.0–5.0)
HCT: 37.6 % (ref 36.0–46.0)
Hemoglobin: 12 g/dL (ref 12.0–15.0)
Lymphocytes Relative: 30.6 % (ref 12.0–46.0)
Lymphs Abs: 3.2 10*3/uL (ref 0.7–4.0)
MCHC: 32 g/dL (ref 30.0–36.0)
MCV: 69.1 fl — ABNORMAL LOW (ref 78.0–100.0)
Monocytes Absolute: 0.4 10*3/uL (ref 0.1–1.0)
Monocytes Relative: 4 % (ref 3.0–12.0)
Neutro Abs: 6.6 10*3/uL (ref 1.4–7.7)
Neutrophils Relative %: 63.3 % (ref 43.0–77.0)
Platelets: 367 10*3/uL (ref 150.0–400.0)
RBC: 5.44 Mil/uL — ABNORMAL HIGH (ref 3.87–5.11)
RDW: 15.6 % — ABNORMAL HIGH (ref 11.5–15.5)
WBC: 10.4 10*3/uL (ref 4.0–10.5)

## 2020-04-14 LAB — VITAMIN D 25 HYDROXY (VIT D DEFICIENCY, FRACTURES): VITD: 11.8 ng/mL — ABNORMAL LOW (ref 30.00–100.00)

## 2020-04-15 ENCOUNTER — Other Ambulatory Visit: Payer: Self-pay

## 2020-04-15 DIAGNOSIS — E559 Vitamin D deficiency, unspecified: Secondary | ICD-10-CM

## 2020-04-15 MED ORDER — VITAMIN D (ERGOCALCIFEROL) 1.25 MG (50000 UNIT) PO CAPS: 50000.0000 [IU] | ORAL_CAPSULE | ORAL | 0 refills | Status: DC

## 2020-04-18 NOTE — Addendum Note (Signed)
Addended by: Eddie North C on: 04/18/2020 12:53 PM   Modules accepted: Orders

## 2020-04-21 ENCOUNTER — Ambulatory Visit (HOSPITAL_COMMUNITY)
Admission: RE | Admit: 2020-04-21 | Discharge: 2020-04-21 | Disposition: A | Discharge status: Home / Self Care | Payer: Medicare Other | Source: Ambulatory Visit | Attending: Orthopedic Surgery | Admitting: Orthopedic Surgery

## 2020-04-21 ENCOUNTER — Other Ambulatory Visit: Payer: Self-pay

## 2020-04-21 DIAGNOSIS — Z96652 Presence of left artificial knee joint: Secondary | ICD-10-CM | POA: Diagnosis not present

## 2020-04-21 DIAGNOSIS — M25562 Pain in left knee: Secondary | ICD-10-CM | POA: Diagnosis not present

## 2020-04-21 MED ORDER — TECHNETIUM TC 99M MEDRONATE IV KIT
21.7000 | PACK | Freq: Once | INTRAVENOUS | Status: AC | PRN
  Administered 2020-04-21: 21.7 via INTRAVENOUS

## 2020-04-23 ENCOUNTER — Encounter: Payer: Self-pay | Admitting: *Deleted

## 2020-04-23 DIAGNOSIS — M25562 Pain in left knee: Secondary | ICD-10-CM | POA: Diagnosis not present

## 2020-04-23 DIAGNOSIS — M5459 Other low back pain: Secondary | ICD-10-CM | POA: Diagnosis not present

## 2020-04-23 DIAGNOSIS — M1711 Unilateral primary osteoarthritis, right knee: Secondary | ICD-10-CM | POA: Diagnosis not present

## 2020-04-23 DIAGNOSIS — Z96652 Presence of left artificial knee joint: Secondary | ICD-10-CM | POA: Diagnosis not present

## 2020-04-28 DIAGNOSIS — M5459 Other low back pain: Secondary | ICD-10-CM | POA: Diagnosis not present

## 2020-05-05 ENCOUNTER — Ambulatory Visit: Payer: Medicare Other | Admitting: Internal Medicine

## 2020-05-05 ENCOUNTER — Other Ambulatory Visit (INDEPENDENT_AMBULATORY_CARE_PROVIDER_SITE_OTHER): Payer: Medicare Other

## 2020-05-05 ENCOUNTER — Telehealth: Payer: Self-pay | Admitting: Internal Medicine

## 2020-05-05 ENCOUNTER — Encounter: Payer: Self-pay | Admitting: Internal Medicine

## 2020-05-05 ENCOUNTER — Other Ambulatory Visit: Payer: Self-pay

## 2020-05-05 VITALS — BP 124/70 | HR 88 | Ht 61.0 in | Wt 216.0 lb

## 2020-05-05 DIAGNOSIS — K5100 Ulcerative (chronic) pancolitis without complications: Secondary | ICD-10-CM

## 2020-05-05 DIAGNOSIS — K219 Gastro-esophageal reflux disease without esophagitis: Secondary | ICD-10-CM

## 2020-05-05 DIAGNOSIS — K51011 Ulcerative (chronic) pancolitis with rectal bleeding: Secondary | ICD-10-CM

## 2020-05-05 LAB — CBC WITH DIFFERENTIAL/PLATELET
Basophils Absolute: 0 10*3/uL (ref 0.0–0.1)
Basophils Relative: 0.7 % (ref 0.0–3.0)
Eosinophils Absolute: 0.1 10*3/uL (ref 0.0–0.7)
Eosinophils Relative: 2.4 % (ref 0.0–5.0)
HCT: 36.7 % (ref 36.0–46.0)
Hemoglobin: 11.7 g/dL — ABNORMAL LOW (ref 12.0–15.0)
Lymphocytes Relative: 31.3 % (ref 12.0–46.0)
Lymphs Abs: 1.8 10*3/uL (ref 0.7–4.0)
MCHC: 31.7 g/dL (ref 30.0–36.0)
MCV: 69 fl — ABNORMAL LOW (ref 78.0–100.0)
Monocytes Absolute: 0.4 10*3/uL (ref 0.1–1.0)
Monocytes Relative: 6.9 % (ref 3.0–12.0)
Neutro Abs: 3.4 10*3/uL (ref 1.4–7.7)
Neutrophils Relative %: 58.7 % (ref 43.0–77.0)
Platelets: 292 10*3/uL (ref 150.0–400.0)
RBC: 5.33 Mil/uL — ABNORMAL HIGH (ref 3.87–5.11)
RDW: 15.1 % (ref 11.5–15.5)
WBC: 5.7 10*3/uL (ref 4.0–10.5)

## 2020-05-05 LAB — COMPREHENSIVE METABOLIC PANEL WITH GFR
ALT: 21 U/L (ref 0–35)
AST: 20 U/L (ref 0–37)
Albumin: 4 g/dL (ref 3.5–5.2)
Alkaline Phosphatase: 85 U/L (ref 39–117)
BUN: 15 mg/dL (ref 6–23)
CO2: 29 meq/L (ref 19–32)
Calcium: 9.5 mg/dL (ref 8.4–10.5)
Chloride: 105 meq/L (ref 96–112)
Creatinine, Ser: 0.74 mg/dL (ref 0.40–1.20)
GFR: 86.72 mL/min
Glucose, Bld: 82 mg/dL (ref 70–99)
Potassium: 3.7 meq/L (ref 3.5–5.1)
Sodium: 142 meq/L (ref 135–145)
Total Bilirubin: 0.3 mg/dL (ref 0.2–1.2)
Total Protein: 7.4 g/dL (ref 6.0–8.3)

## 2020-05-05 LAB — HIGH SENSITIVITY CRP: CRP, High Sensitivity: 24.45 mg/L — ABNORMAL HIGH (ref 0.000–5.000)

## 2020-05-05 MED ORDER — SUTAB 1479-225-188 MG PO TABS: ORAL_TABLET | ORAL | 0 refills | Status: DC

## 2020-05-05 MED ORDER — MESALAMINE 1.2 G PO TBEC: 4.8000 g | DELAYED_RELEASE_TABLET | Freq: Every day | ORAL | 1 refills | Status: DC

## 2020-05-05 MED ORDER — PANTOPRAZOLE SODIUM 40 MG PO TBEC: 40.0000 mg | DELAYED_RELEASE_TABLET | Freq: Every day | ORAL | 2 refills | Status: DC

## 2020-05-05 NOTE — Progress Notes (Signed)
Patient ID: [name], female   DOB: [birth year], 65 y.o.   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  She was known to me previously but has not been seen since September 2018.  She lost her medical insurance and has been off of therapy since 2019.  She reports that she continues to have issues with diarrhea intermittently with blood in stool.  This is associated with mid lower abdominal pain which is worse with eating certain types of food.  She has been off of her Lialda since 2019.  She is used intermittently Pepto-Bismol which helps a little with the abdominal pain and diarrhea but not completely.  She was recently prescribed prednisone a 3-week course by Dr. Alvan Dame to help with her lower back pain.  It helped her back pain and it also helped her colitis symptoms but not completely.  She has been off medication for a few days and the colitis symptoms are worsening again at this point.  She has a history of GERD and is taking over-the-counter omeprazole but this is more expensive than previously prescribed pantoprazole.  No dysphagia or odynophagia.  Her last colonoscopy was performed in September 2018.  This revealed inflammation with mild scattered erosions from the rectum to cecum.  Biopsies were performed.  There were no polyps seen.  Colitis was not active histologically.  No dysplasia  Past Medical History:  Diagnosis Date  . Arthritis   . COVID-19   . GERD (gastroesophageal reflux disease)   . Microcytic anemia   . Migraine   . Scoliosis   . Ulcerative pancolitis ([place])    Colonoscopy with Dr. Hilarie Fredrickson  . Vitamin D deficiency     Past Surgical History:  Procedure Laterality Date  . ABDOMINAL HYSTERECTOMY     partial  . ANTERIOR LAT LUMBAR FUSION N/A  01/28/2016   Procedure: ANTERIOR LATERAL LUMBAR FUSION 2 LEVELS XLIF L2-4;  Surgeon: Melina Schools, MD;  Location: [place];  Service: Orthopedics;  Laterality: N/A;  . BACK SURGERY    . BREAST EXCISIONAL BIOPSY Bilateral    benign  . BREAST REDUCTION SURGERY Bilateral 06/16/2017   Procedure: BILATERAL MAMMARY REDUCTION  (BREAST);  Surgeon: Wallace Going, DO;  Location: [place];  Service: Plastics;  Laterality: Bilateral;  . BREAST SURGERY Bilateral    cysts 65 yrs old  . COLONOSCOPY  20 + years ago   In [place].  . KNEE SURGERY Left 12-13,11-10   meniscal repairs  . LUMBAR FUSION  01/28/2016   ANTERIOR LATERAL LUMBAR FUSION 2 LEVELS XLIF L2-4 (N/A)  . TOTAL KNEE ARTHROPLASTY Left 01/14/2015   Procedure: TOTAL KNEE ARTHROPLASTY;  Surgeon: Paralee Cancel, MD;  Location: [facility];  Service: Orthopedics;  Laterality: Left;    Outpatient Medications Prior to Visit  Medication Sig Dispense Refill  . calcium carbonate (TUMS - DOSED IN MG ELEMENTAL CALCIUM) 500 MG chewable tablet Chew 1 tablet by mouth daily. As needed    . diclofenac (VOLTAREN) 75 MG EC tablet Take 75 mg by mouth 2 (two) times daily.    Marland Kitchen gabapentin (NEURONTIN) 300 MG capsule Take 300 mg by mouth daily.    . SUMAtriptan (IMITREX) 100 MG tablet Take 1 tablet (100 mg total) by mouth every 2 (two) hours as needed  for migraine. 10 tablet 6  . Vitamin D, Ergocalciferol, (DRISDOL) 1.25 MG (50000 UNIT) CAPS capsule Take 1 capsule (50,000 Units total) by mouth every 7 (seven) days. 12 capsule 0  . omeprazole (PRILOSEC) 20 MG capsule Take 20 mg by mouth daily.    Marland Kitchen azithromycin (ZITHROMAX) 250 MG tablet Take 2 tablets on Day 1. Then take 1 tablet daily. (Patient not taking: Reported on 04/11/2020) 6 tablet 0  . benzonatate (TESSALON) 100 MG capsule Take 1 capsule (100 mg total) by mouth 3 (three) times daily as needed for cough. (Patient not taking: Reported on 04/11/2020) 30 capsule 0  . predniSONE (STERAPRED  UNI-PAK 21 TAB) 5 MG (21) TBPK tablet SMARTSIG:1 Tablet(s) By Mouth     No facility-administered medications prior to visit.    Allergies  Allergen Reactions  . No Known Allergies     Family History  Problem Relation Age of Onset  . Sickle cell trait Mother   . Diabetes Brother   . Diabetes Daughter   . Asthma Other   . Colon cancer Neg Hx     Social History   Tobacco Use  . Smoking status: Never Smoker  . Smokeless tobacco: Never Used  Vaping Use  . Vaping Use: Never used  Substance Use Topics  . Alcohol use: No    Alcohol/week: 0.0 standard drinks  . Drug use: No    ROS: As per history of present illness, otherwise negative  BP 124/70   Pulse 88   Ht 5' 1"  (1.549 m)   Wt 216 lb (98 kg)   SpO2 98%   BMI 40.81 kg/m  Constitutional: Well-developed and well-nourished. No distress. HEENT: Normocephalic and atraumatic.  No scleral icterus. Neck: Neck supple. Trachea midline. Cardiovascular: Normal rate, regular rhythm and intact distal pulses. No M/R/G Pulmonary/chest: Effort normal and breath sounds normal. No wheezing, rales or rhonchi. Abdominal: Soft, mild lower abdominal pain with palpation without rebound or guarding nondistended. Bowel sounds active throughout. There are no masses palpable. No hepatosplenomegaly. Extremities: no clubbing, cyanosis, or edema Neurological: Alert and oriented to person place and time. Skin: Skin is warm and dry.  Psychiatric: Normal mood and affect. Behavior is normal.   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  1.  Pan ulcerative colitis, diagnosis 30+ years ago --we discussed management of her ulcerative colitis today.  She previously had done very well with mesalamine.  She may have overlapping IBD arthropathy which we  discussed as well.  Would at least start back mesalamine but consideration of biologic depending on disease activity and response clinically.  She may be a candidate for Entyvio or Humira should this be necessary --Resume Lialda 4.8 g daily; we will taper to 2.4 g down the road if able --CBC, CMP, CRP --Uceris if Lialda not improving symptoms alone, I asked that she notify me in about 2 weeks --Surveillance colonoscopy in about a month; we reviewed the risk, benefits and alternatives and she is agreeable and wishes to proceed --She is taking diclofenac for arthritis.  We discussed that this is an NSAID and may have the potential for worsening colitis.  2.  GERD --well-controlled the change to pantoprazole 40 mg once daily    YS:AYTKZS, Aundra Millet, Md [address] [place] [address] [place],  [place] [address]

## 2020-05-05 NOTE — Telephone Encounter (Signed)
Pt states that Lialda is over $300. She would like another alternative. Pls call her.

## 2020-05-05 NOTE — Telephone Encounter (Signed)
I have spoken to patient to advise that she will need to contact her insurance company to find out what alternative mesalamine product they will cover. Once she is given this information, she should let us know and we will choose from one of those medications. She verbalizes understanding of this.

## 2020-05-05 NOTE — Patient Instructions (Addendum)
You have been scheduled for a colonoscopy. Please follow written instructions given to you at your visit today.  Please pick up your prep supplies at the pharmacy within the next 1-3 days. If you use inhalers (even only as needed), please bring them with you on the day of your procedure.  Your provider has requested that you go to the basement level for lab work before leaving today. Press "B" on the elevator. The lab is located at the first door on the left as you exit the elevator.  We have sent the following medications to your pharmacy for you to pick up at your convenience: Lialda 4.8 g daily Pantoprazole 40 mg daily (discontinue omeprazole)  Please call our office in 2 weeks to let us know if your colitis symptoms are improving.  If you are age 30 or older, your body mass index should be between 23-30. Your Body mass index is 40.81 kg/m. If this is out of the aforementioned range listed, please consider follow up with your Primary Care Provider.  If you are age 90 or younger, your body mass index should be between 19-25. Your Body mass index is 40.81 kg/m. If this is out of the aformentioned range listed, please consider follow up with your Primary Care Provider.   Due to recent changes in healthcare laws, you may see the results of your imaging and laboratory studies on MyChart before your provider has had a chance to review them.  We understand that in some cases there may be results that are confusing or concerning to you. Not all laboratory results come back in the same time frame and the provider may be waiting for multiple results in order to interpret others.  Please give [place] 48 hours in order for your provider to thoroughly review all the results before contacting the office for clarification of your results.

## 2020-05-06 ENCOUNTER — Other Ambulatory Visit (INDEPENDENT_AMBULATORY_CARE_PROVIDER_SITE_OTHER): Payer: Medicare Other

## 2020-05-06 DIAGNOSIS — D509 Iron deficiency anemia, unspecified: Secondary | ICD-10-CM

## 2020-05-06 LAB — FERRITIN: Ferritin: 35.6 ng/mL (ref 10.0–291.0)

## 2020-05-06 MED ORDER — FOLIC ACID 1 MG PO TABS: 1.0000 mg | ORAL_TABLET | Freq: Every day | ORAL | 0 refills | Status: DC

## 2020-05-06 MED ORDER — SULFASALAZINE 500 MG PO TABS: ORAL_TABLET | ORAL | 1 refills | Status: DC

## 2020-05-06 NOTE — Addendum Note (Signed)
Addended by: Larina Bras on: 05/06/2020 01:13 PM   Modules accepted: Orders

## 2020-05-06 NOTE — Telephone Encounter (Signed)
Dr Hilarie Fredrickson- Please see conversation below and advise.Marland KitchenMarland KitchenMarland Kitchen

## 2020-05-06 NOTE — Telephone Encounter (Signed)
Ok to try sulfasalazine Begin 500 mg BID x 1 week Then increase to 1000 mg BID   Also use folate 1 mg daily with this medication  She should let me know if any side effects (nausea, abd pain, rash)

## 2020-05-06 NOTE — Telephone Encounter (Signed)
I have spoken to patient to advise of Dr Vena Rua recommendation. She verbalizes understanding and is in agreement with plan. Rx sent to [facility] per patient request. She will let us know if she has any side effects. She is also reminded of the importance of taking folic acid along with sulfasalazine.

## 2020-05-06 NOTE — Telephone Encounter (Signed)
Patient called to advise she spoke with her insurance and they are giving her the option or alternative with Sulfadiazine 500 mg. She's if that would be a good alternative for her or would it be best just take the Mesalamine.

## 2020-05-15 DIAGNOSIS — M5416 Radiculopathy, lumbar region: Secondary | ICD-10-CM | POA: Diagnosis not present

## 2020-06-04 DIAGNOSIS — M1711 Unilateral primary osteoarthritis, right knee: Secondary | ICD-10-CM | POA: Diagnosis not present

## 2020-06-04 DIAGNOSIS — M25562 Pain in left knee: Secondary | ICD-10-CM | POA: Diagnosis not present

## 2020-06-04 DIAGNOSIS — M25561 Pain in right knee: Secondary | ICD-10-CM | POA: Diagnosis not present

## 2020-06-13 ENCOUNTER — Telehealth: Payer: Self-pay | Admitting: Internal Medicine

## 2020-06-13 ENCOUNTER — Other Ambulatory Visit: Payer: Self-pay

## 2020-06-13 ENCOUNTER — Ambulatory Visit (INDEPENDENT_AMBULATORY_CARE_PROVIDER_SITE_OTHER): Payer: Medicare Other | Admitting: Family Medicine

## 2020-06-13 ENCOUNTER — Telehealth: Payer: Self-pay

## 2020-06-13 ENCOUNTER — Encounter: Payer: Self-pay | Admitting: Family Medicine

## 2020-06-13 VITALS — BP 122/90 | HR 84 | Temp 97.3°F | Resp 19 | Ht 61.0 in | Wt 211.8 lb

## 2020-06-13 DIAGNOSIS — R1319 Other dysphagia: Secondary | ICD-10-CM | POA: Diagnosis not present

## 2020-06-13 DIAGNOSIS — K51011 Ulcerative (chronic) pancolitis with rectal bleeding: Secondary | ICD-10-CM

## 2020-06-13 MED ORDER — PANTOPRAZOLE SODIUM 40 MG PO TBEC: 40.0000 mg | DELAYED_RELEASE_TABLET | Freq: Two times a day (BID) | ORAL | 2 refills | Status: DC

## 2020-06-13 MED ORDER — SUCRALFATE 1 G PO TABS: 1.0000 g | ORAL_TABLET | Freq: Three times a day (TID) | ORAL | 0 refills | Status: DC

## 2020-06-13 NOTE — Telephone Encounter (Signed)
Pt scheduled to see Carl Best NP 06/16/20 at 11am. Please call office back with the appt.

## 2020-06-13 NOTE — Progress Notes (Signed)
   Subjective:    Patient ID: [name], female    DOB: 1955/04/11, 65 y.o.   MRN: [id number]  HPI Dysphagia- 'it feels like i'm choking.  Like there's something in my throat'.  sxs started ~2 weeks ago but sxs have been worsening.  Difficulty taking pills, eating.  Able to drink and speak.  When drinking, pt has sensation that it's passing slowly.  No obvious trigger, no known choking episode.  Pt does have severe reflux and is having sxs daily despite Protonix.   Review of Systems For ROS see HPI   This visit occurred during the SARS-CoV-2 public health emergency.  Safety protocols were in place, including screening questions prior to the visit, additional usage of staff PPE, and extensive cleaning of exam room while observing appropriate contact time as indicated for disinfecting solutions.       Objective:   Physical Exam Vitals reviewed.  Constitutional:      General: She is not in acute distress.    Appearance: Normal appearance. She is obese. She is not ill-appearing.  Cardiovascular:     Rate and Rhythm: Normal rate and regular rhythm.     Pulses: Normal pulses.     Heart sounds: Normal heart sounds.  Pulmonary:     Effort: Pulmonary effort is normal.     Breath sounds: Normal breath sounds. No wheezing, rhonchi or rales.  Musculoskeletal:     Cervical back: Normal range of motion and neck supple. No rigidity.  Lymphadenopathy:     Cervical: No cervical adenopathy.  Skin:    General: Skin is warm and dry.  Neurological:     General: No focal deficit present.     Mental Status: She is alert and oriented to person, place, and time.  Psychiatric:        Mood and Affect: Mood normal.        Behavior: Behavior normal.        Thought Content: Thought content normal.           Assessment & Plan:  Dysphagia- new.  Pt reports she has had issues x2 weeks but these are progressing.  Initially only occurred w/ pills.  Then she had trouble w/ food.  Now she reports she  feels like something is stuck even w/ speaking.  She states she feels it would be better if she could throw up and expel the object- if present (since she doesn't remember choking or swallowing anything).  I worry about tearing her esophagus if there is a bone or foreign object in there.  Will increase Protonix to BID as pt is having regular break through reflux sxs despite daily PPI.  This reflux may be causing swelling or sensation of lump in throat.  Will also start Carafate.  Called GI to schedule ASAP but nurse in charge of urgent scheduling not available to speak to.  Pt has colonoscopy scheduled for next Friday but need to make sure she is even safe to have anesthesia given her current issue.  Pt expressed understanding and is in agreement w/ plan.

## 2020-06-13 NOTE — Telephone Encounter (Signed)
Patient would like to know if she can take sucralfate with protonix?

## 2020-06-13 NOTE — Telephone Encounter (Signed)
Called PCP and informed of patient's appt.  Also called patient and left voicemail ail with info.

## 2020-06-13 NOTE — Telephone Encounter (Signed)
Inbound call from patient's PCP stating she was seen at their office today and is having trouble swallowing.  Patient is scheduled for colonoscopy on 06/20/20 but they want to see if patient can be seen sooner.  Please advise.  Number for PCP; [phone], ask for [facility].

## 2020-06-13 NOTE — Patient Instructions (Signed)
Increase the Protonix to twice daily to help prevent inflammation and swelling- I sent in a new prescription If your breathing worsens or you have trouble managing your own saliva, please go to the ER We are working w/ GI to get you an appt ASAP Call with any questions or concerns Hang in there!

## 2020-06-15 NOTE — Progress Notes (Signed)
06/16/2020 [name] [id number] September 05, 1955   Chief Complaint: difficulty swallowing   History of Present Illness: [name] is a 65 year old female with a past medical history of migraine headaches, arthritis, vitamin D deficiency, GERD and ulcerative pancolitis diagnosed 30+ years ago. She was last seen in office by Dr. Hilarie Fredrickson on 05/05/2020, not seen since 2018 and off therapy since 2019. At the time of her follow up 05/05/2020, she was having lower back pain, abdomina pain and diarrhea. She was prescribed Lialda but her insurance did not cover the cost of this medication therefore she was prescribed Sulfasalazine  500 mg p.o. twice daily for 1 week and then 500 mg 2 tabs p.o. twice daily with folic acid 1 mg daily.  A surveillance colonoscopy was scheduled on 06/20/2020.  She presents to our office today with complaints of dysphagia.  She recalled swallowing one of the Sulfasalazine tablets which briefly became stuck in her throat or upper esophagus area about 2 weeks ago.  Since that time, she complains of mild odynophagia and she describes feeling as if food is slower to pass down the esophagus.  She feels as if she could cough or burp she would feel relief.  She has a history of acid reflux symptoms for which she took Omeprazole for 2 years then was switched to Pantoprazole 40 mg twice daily at the time of her follow up appointment with Dr.Pyrtle.  She denies ever having an EGD.  She takes Diclofenac 75 mg twice daily, however, she was off this medication recently when she was on oral prednisone for her back pain.  Completed the Prednisone taper and she is back on Diclofenac twice daily.  No other NSAID use.  She is having 3-4 loose brown stools daily, no watery diarrhea.  No bloody stools or melena.  No abdominal pain. Her last colonoscopy was September 2018 which revealed inflammation with mild scattered erosions from the rectum to cecum.  Biopsies were negative for active colitis.  No  polyps.    CBC Latest Ref Rng & Units 06/16/2020 05/05/2020 04/14/2020  WBC 4.0 - 10.5 K/uL 7.2 5.7 10.4  Hemoglobin 12.0 - 15.0 g/dL 12.1 11.7(L) 12.0  Hematocrit 36.0 - 46.0 % 38.0 36.7 37.6  Platelets 150.0 - 400.0 K/uL 352.0 292.0 367.0   CMP Latest Ref Rng & Units 06/16/2020 05/05/2020 05/13/2017  Glucose 70 - 99 mg/dL 89 82 62(L)  BUN 6 - 23 mg/dL 10 15 9   Creatinine 0.40 - 1.20 mg/dL 0.85 0.74 0.75  Sodium 135 - 145 mEq/L 140 142 142  Potassium 3.5 - 5.1 mEq/L 4.1 3.7 4.1  Chloride 96 - 112 mEq/L 102 105 104  CO2 19 - 32 mEq/L 30 29 26   Calcium 8.4 - 10.5 mg/dL 9.7 9.5 9.6  Total Protein 6.0 - 8.3 g/dL 8.1 7.4 7.6  Total Bilirubin 0.2 - 1.2 mg/dL 0.6 0.3 0.7  Alkaline Phos 39 - 117 U/L 83 85 -  AST 0 - 37 U/L 21 20 19   ALT 0 - 35 U/L 22 21 14   CRP 24.4. Ferritin 35.6. Vitamin D 11.8 on 04/14/2020. Prescribed vitamin D by PCP.  Colonoscopy 10/21/2016 by Dr. Hilarie Fredrickson: - Pancolitis ulcerative colitis. Scattered mild inflammation. Biopsied. - The examination was otherwise normal on direct and retroflexion views. - Surveillance colonoscopy 2 years  -Lialda 2.4 gm QD Biopsy Report: 1. Surgical [P], right colon BX - COLONIC MUCOSA WITH BENIGN LYMPHOID AGGREGATES. - NO ACTIVE INFLAMMATION, CHRONIC CHANGES OR GRANULOMAS. -  NEGATIVE FOR DYSPLASIA. 2. Surgical [P], left colon BX - COLONIC MUCOSA WITH BENIGN LYMPHOID AGGREGATES. - NO ACTIVE INFLAMMATION, CHRONIC CHANGES OR GRANULOMAS. - NEGATIVE FOR DYSPLASIA.  Past Medical History:  Diagnosis Date  . Arthritis   . COVID-19   . GERD (gastroesophageal reflux disease)   . Microcytic anemia   . Migraine   . Scoliosis   . Ulcerative pancolitis ([place])    Colonoscopy with Dr. Hilarie Fredrickson  . Vitamin D deficiency    Past Surgical History:  Procedure Laterality Date  . ABDOMINAL HYSTERECTOMY     partial  . ANTERIOR LAT LUMBAR FUSION N/A 01/28/2016   Procedure: ANTERIOR LATERAL LUMBAR FUSION 2 LEVELS XLIF L2-4;  Surgeon: Melina Schools, MD;   Location: [place];  Service: Orthopedics;  Laterality: N/A;  . BACK SURGERY    . BREAST EXCISIONAL BIOPSY Bilateral    benign  . BREAST REDUCTION SURGERY Bilateral 06/16/2017   Procedure: BILATERAL MAMMARY REDUCTION  (BREAST);  Surgeon: Wallace Going, DO;  Location: [place];  Service: Plastics;  Laterality: Bilateral;  . BREAST SURGERY Bilateral    cysts 65 yrs old  . COLONOSCOPY  20 + years ago   In [place].  . KNEE SURGERY Left 12-13,11-10   meniscal repairs  . LUMBAR FUSION  01/28/2016   ANTERIOR LATERAL LUMBAR FUSION 2 LEVELS XLIF L2-4 (N/A)  . TOTAL KNEE ARTHROPLASTY Left 01/14/2015   Procedure: TOTAL KNEE ARTHROPLASTY;  Surgeon: Paralee Cancel, MD;  Location: [facility];  Service: Orthopedics;  Laterality: Left;    Current Outpatient Medications on File Prior to Visit  Medication Sig Dispense Refill  . calcium carbonate (TUMS - DOSED IN MG ELEMENTAL CALCIUM) 500 MG chewable tablet Chew 1 tablet by mouth daily. As needed    . diclofenac (VOLTAREN) 75 MG EC tablet Take 75 mg by mouth 2 (two) times daily.    . folic acid (FOLVITE) 1 MG tablet Take 1 tablet (1 mg total) by mouth daily. 90 tablet 0  . pantoprazole (PROTONIX) 40 MG tablet Take 1 tablet (40 mg total) by mouth 2 (two) times daily. 60 tablet 2  . sucralfate (CARAFATE) 1 g tablet Take 1 tablet (1 g total) by mouth 4 (four) times daily -  with meals and at bedtime. 368 tablet 0  . sulfaSALAzine (AZULFIDINE) 500 MG tablet Take 1 tablet by mouth twice daily x 1 week, then increase to 2 tablets by mouth twice daily thereafter 120 tablet 1  . SUMAtriptan (IMITREX) 100 MG tablet Take 1 tablet (100 mg total) by mouth every 2 (two) hours as needed for migraine. 10 tablet 6  . Vitamin D, Ergocalciferol, (DRISDOL) 1.25 MG (50000 UNIT) CAPS capsule Take 1 capsule (50,000 Units total) by mouth every 7 (seven) days. 12 capsule 0   No current facility-administered medications on file prior to visit.   Allergies   Allergen Reactions  . No Known Allergies     Current Medications, Allergies, Past Medical History, Past Surgical History, Family History and Social History were reviewed in [employer] record.  Review of Systems:   Constitutional: Negative for fever, sweats, chills or weight loss.  Respiratory: Negative for shortness of breath.   Cardiovascular: Negative for chest pain, palpitations and leg swelling.  Gastrointestinal: See HPI.  Musculoskeletal: Negative for back pain or muscle aches.  Neurological: Negative for dizziness, headaches or paresthesias.    Physical Exam: BP 132/80   Pulse 88   Ht 5' 1"  (1.549 m)  Wt 211 lb 6.4 oz (95.9 kg)   BMI 39.94 kg/m  General: 65 year old female in no acute distress. Head: Normocephalic and atraumatic. Eyes: No scleral icterus. Conjunctiva pink . Ears: Normal auditory acuity. Mouth: Dentition intact. No ulcers or lesions.  Lungs: Clear throughout to auscultation. Heart: Regular rate and rhythm, no murmur. Abdomen: Soft, nondistended. Mild RLQ tenderness. No masses or hepatomegaly. Normal bowel sounds x 4 quadrants.  Rectal: Deferred.  Musculoskeletal: Symmetrical with no gross deformities. Extremities: No edema. Neurological: Alert oriented x 4. No focal deficits.  Psychological: Alert and cooperative. Normal mood and affect  Assessment and Recommendations:  1. History of GERD symptoms. New onset dysphagia with mild odynophagia. Chronic NSAID use, recent steroid injection and Prednisone (for back pain).  -EGD at time of previously scheduled colonoscopy. EGD benefits and risks discussed including risk with sedation, risk of bleeding, perforation and infection  -Hold Carafate for 2 days prior to EGD date -Patient aware chronic diclofenac use increases the risk of GERD/ulcers  2. Panulcerative colitis. Started on Sulfasalazine and folic acid 4/72/0721. -Continue Sulfasalazine and folic acid as previously prescribed  by Dr. Hilarie Fredrickson -CBC and CMP to check blood counts and liver function following Sulfasalazine initiation. -Patient aware chronic Diclofenac increases risk of colitis flares  Patient to call our office if her symptoms worsen Further follow-up to be determined after EGD and colonoscopy completed

## 2020-06-16 ENCOUNTER — Other Ambulatory Visit (INDEPENDENT_AMBULATORY_CARE_PROVIDER_SITE_OTHER): Payer: Medicare Other

## 2020-06-16 ENCOUNTER — Encounter: Payer: Self-pay | Admitting: Nurse Practitioner

## 2020-06-16 ENCOUNTER — Ambulatory Visit: Payer: Medicare Other | Admitting: Nurse Practitioner

## 2020-06-16 VITALS — BP 132/80 | HR 88 | Ht 61.0 in | Wt 211.4 lb

## 2020-06-16 DIAGNOSIS — R1310 Dysphagia, unspecified: Secondary | ICD-10-CM

## 2020-06-16 DIAGNOSIS — K219 Gastro-esophageal reflux disease without esophagitis: Secondary | ICD-10-CM

## 2020-06-16 LAB — COMPREHENSIVE METABOLIC PANEL
ALT: 22 U/L (ref 0–35)
AST: 21 U/L (ref 0–37)
Albumin: 4.1 g/dL (ref 3.5–5.2)
Alkaline Phosphatase: 83 U/L (ref 39–117)
BUN: 10 mg/dL (ref 6–23)
CO2: 30 mEq/L (ref 19–32)
Calcium: 9.7 mg/dL (ref 8.4–10.5)
Chloride: 102 mEq/L (ref 96–112)
Creatinine, Ser: 0.85 mg/dL (ref 0.40–1.20)
GFR: 73.38 mL/min (ref >60.00)
Glucose, Bld: 89 mg/dL (ref 70–99)
Potassium: 4.1 mEq/L (ref 3.5–5.1)
Sodium: 140 mEq/L (ref 135–145)
Total Bilirubin: 0.6 mg/dL (ref 0.2–1.2)
Total Protein: 8.1 g/dL (ref 6.0–8.3)

## 2020-06-16 LAB — CBC
HCT: 38 % (ref 36.0–46.0)
Hemoglobin: 12.1 g/dL (ref 12.0–15.0)
MCHC: 31.8 g/dL (ref 30.0–36.0)
MCV: 69 fl — ABNORMAL LOW (ref 78.0–100.0)
Platelets: 352 10*3/uL (ref 150.0–400.0)
RBC: 5.49 Mil/uL — ABNORMAL HIGH (ref 3.87–5.11)
RDW: 14.9 % (ref 11.5–15.5)
WBC: 7.2 10*3/uL (ref 4.0–10.5)

## 2020-06-16 NOTE — Telephone Encounter (Signed)
Called patient with PCP recommendations. Patient voiced understanding.

## 2020-06-16 NOTE — Patient Instructions (Addendum)
If you are age 63 or younger, your body mass index should be between 19-25. Your Body mass index is 39.94 kg/m. If this is out of the aformentioned range listed, please consider follow up with your Primary Care Provider.   PROCEDURES: You have been scheduled for an endoscopy and colonoscopy. Please follow the written instructions given to you at your visit today. If you use inhalers (even only as needed), please bring them with you on the day of your procedure.  LABS:  Lab work has been ordered for you today. Our lab is located in the basement. Press "B" on the elevator. The lab is located at the first door on the left as you exit the elevator.  HEALTHCARE LAWS AND MY CHART RESULTS: Due to recent changes in healthcare laws, you may see the results of your imaging and laboratory studies on MyChart before your provider has had a chance to review them.   We understand that in some cases there may be results that are confusing or concerning to you. Not all laboratory results come back in the same time frame and the provider may be waiting for multiple results in order to interpret others.  Please give [place] 48 hours in order for your provider to thoroughly review all the results before contacting the office for clarification of your results.   RECOMMENDATIONS: Hold Carafate for 2 days prior to your upcoming procedures. Please call our office if your symptoms worsen.  It was great seeing you today! Thank you for entrusting me with your care and choosing [facility].  Noralyn Pick, CRNP

## 2020-06-16 NOTE — Telephone Encounter (Signed)
absolutely

## 2020-06-17 DIAGNOSIS — M5459 Other low back pain: Secondary | ICD-10-CM | POA: Diagnosis not present

## 2020-06-20 ENCOUNTER — Other Ambulatory Visit: Payer: Self-pay

## 2020-06-20 ENCOUNTER — Other Ambulatory Visit: Payer: Self-pay | Admitting: Internal Medicine

## 2020-06-20 ENCOUNTER — Encounter: Payer: Self-pay | Admitting: Internal Medicine

## 2020-06-20 ENCOUNTER — Ambulatory Visit: Payer: Medicare Other | Admitting: Internal Medicine

## 2020-06-20 VITALS — BP 145/92 | HR 84 | Temp 97.7°F | Resp 20 | Ht 61.0 in | Wt 211.0 lb

## 2020-06-20 DIAGNOSIS — K219 Gastro-esophageal reflux disease without esophagitis: Secondary | ICD-10-CM

## 2020-06-20 DIAGNOSIS — R1310 Dysphagia, unspecified: Secondary | ICD-10-CM

## 2020-06-20 DIAGNOSIS — K5190 Ulcerative colitis, unspecified, without complications: Secondary | ICD-10-CM | POA: Diagnosis not present

## 2020-06-20 DIAGNOSIS — K5100 Ulcerative (chronic) pancolitis without complications: Secondary | ICD-10-CM

## 2020-06-20 DIAGNOSIS — K2960 Other gastritis without bleeding: Secondary | ICD-10-CM | POA: Diagnosis not present

## 2020-06-20 DIAGNOSIS — K2990 Gastroduodenitis, unspecified, without bleeding: Secondary | ICD-10-CM

## 2020-06-20 DIAGNOSIS — K2970 Gastritis, unspecified, without bleeding: Secondary | ICD-10-CM

## 2020-06-20 MED ORDER — SODIUM CHLORIDE 0.9 % IV SOLN: 500.0000 mL | Freq: Once | INTRAVENOUS | Status: DC

## 2020-06-20 NOTE — Op Note (Signed)
[place] Patient Name: [name] Procedure Date: 06/20/2020 1:27 PM MRN: [id number] Endoscopist: Jerene Bears , MD Age: 65 Referring MD:  Date of Birth: 18-Dec-1955 Gender: Female Account #: [id number] Procedure:                Upper GI endoscopy Indications:              Dysphagia Medicines:                Monitored Anesthesia Care Procedure:                Pre-Anesthesia Assessment:                           - Prior to the procedure, a History and Physical                            was performed, and patient medications and                            allergies were reviewed. The patient's tolerance of                            previous anesthesia was also reviewed. The risks                            and benefits of the procedure and the sedation                            options and risks were discussed with the patient.                            All questions were answered, and informed consent                            was obtained. Prior Anticoagulants: The patient has                            taken no previous anticoagulant or antiplatelet                            agents. ASA Grade Assessment: III - A patient with                            severe systemic disease. After reviewing the risks                            and benefits, the patient was deemed in                            satisfactory condition to undergo the procedure.                           After obtaining informed consent, the endoscope was  passed under direct vision. Throughout the                            procedure, the patient's blood pressure, pulse, and                            oxygen saturations were monitored continuously. The                            Endoscope was introduced through the mouth, and                            advanced to the second part of duodenum. The upper                            GI endoscopy was accomplished without  difficulty.                            The patient tolerated the procedure well. Scope In: Scope Out: Findings:                 Normal mucosa was found in the entire esophagus.                           No endoscopic abnormality was evident in the                            esophagus to explain the patient's complaint of                            dysphagia. It was decided, however, to proceed with                            dilation of the entire esophagus. The scope was                            withdrawn. Dilation was performed with a Maloney                            dilator with mild resistance at 52 Fr.                           Moderate inflammation characterized by congestion                            (edema), erythema and granularity was found in the                            gastric antrum. Biopsies were taken with a cold                            forceps for histology and Helicobacter pylori  testing.                           The cardia and gastric fundus were normal on                            retroflexion.                           The examined duodenum was normal. Complications:            No immediate complications. Estimated Blood Loss:     Estimated blood loss was minimal. Impression:               - Normal mucosa was found in the entire esophagus.                           - No endoscopic esophageal abnormality to explain                            patient's dysphagia. Esophagus dilated with 52 Fr                            Maloney.                           - Gastritis. Biopsied.                           - Normal examined duodenum. Recommendation:           - Patient has a contact number available for                            emergencies. The signs and symptoms of potential                            delayed complications were discussed with the                            patient. Return to normal activities tomorrow.                             Written discharge instructions were provided to the                            patient.                           - Resume previous diet.                           - Continue present medications.                           - Await pathology results.                           - Please let  me know if trouble swallowing persists.                           - See the other procedure note for documentation of                            additional recommendations. Jerene Bears, MD 06/20/2020 2:07:10 PM This report has been signed electronically.

## 2020-06-20 NOTE — Op Note (Signed)
[place] Patient Name: [name] Procedure Date: 06/20/2020 1:27 PM MRN: [id number] Endoscopist: Jerene Bears , MD Age: 65 Referring MD:  Date of Birth: 1956/01/23 Gender: Female Account #: [id number] Procedure:                Colonoscopy Indications:              High risk colon cancer surveillance: Ulcerative                            pancolitis of 8 (or more) years duration, Last                            colonoscopy: September 2018 Medicines:                Monitored Anesthesia Care Procedure:                Pre-Anesthesia Assessment:                           - Prior to the procedure, a History and Physical                            was performed, and patient medications and                            allergies were reviewed. The patient's tolerance of                            previous anesthesia was also reviewed. The risks                            and benefits of the procedure and the sedation                            options and risks were discussed with the patient.                            All questions were answered, and informed consent                            was obtained. Prior Anticoagulants: The patient has                            taken no previous anticoagulant or antiplatelet                            agents. ASA Grade Assessment: III - A patient with                            severe systemic disease. After reviewing the risks                            and benefits, the patient was deemed in  satisfactory condition to undergo the procedure.                           After obtaining informed consent, the colonoscope                            was passed under direct vision. Throughout the                            procedure, the patient's blood pressure, pulse, and                            oxygen saturations were monitored continuously. The                            Olympus CF-HQ190L ([id number])  Colonoscope was                            introduced through the anus and advanced to the                            cecum, identified by appendiceal orifice and                            ileocecal valve. The colonoscopy was performed                            without difficulty. The patient tolerated the                            procedure well. The quality of the bowel                            preparation was good. The ileocecal valve,                            appendiceal orifice, and rectum were photographed. Scope In: 1:50:14 PM Scope Out: 2:02:05 PM Scope Withdrawal Time: 0 hours 9 minutes 52 seconds  Total Procedure Duration: 0 hours 11 minutes 51 seconds  Findings:                 The digital rectal exam was normal.                           Inflammation was not found based on the endoscopic                            appearance of the mucosa in the colon. This was                            graded as Mayo Score 0 (normal or inactive                            disease), and when compared to the previous  examination, the findings are improved. Four                            biopsies were taken every 10 cm with a cold forceps                            from the entire colon for dysplasia surveillance                            and ulcerative colitis surveillance. These biopsy                            specimens from the right colon and left colon were                            sent to Pathology.                           Retroflexion in the rectum was not performed due to                            inability to maintain insufflation and narrow                            rectal vault.                           Internal hemorrhoids were found during endoscopy.                            The hemorrhoids were small. Complications:            No immediate complications. Estimated Blood Loss:     Estimated blood loss was minimal. Impression:                - Inactive (Mayo Score 0) ulcerative colitis,                            improved since the last examination. Biopsied.                           - Small internal hemorrhoids. Recommendation:           - Patient has a contact number available for                            emergencies. The signs and symptoms of potential                            delayed complications were discussed with the                            patient. Return to normal activities tomorrow.                            Written discharge instructions were provided  to the                            patient.                           - Resume previous diet.                           - Continue present medications including                            sulfasalazine and folate.                           - Await pathology results.                           - Office follow-up in 6-12 months, sooner if                            symptoms of active colitis return.                           - Repeat colonoscopy is recommended for                            surveillance. The colonoscopy date will be                            determined after pathology results from today's                            exam become available for review. Jerene Bears, MD 06/20/2020 2:12:58 PM This report has been signed electronically.

## 2020-06-20 NOTE — Progress Notes (Signed)
[place] - VS  Pt reported she has a chipped upper left sided tooth. maw

## 2020-06-20 NOTE — Progress Notes (Signed)
PT taken to PACU. Monitors in place. VSS. Report given to RN. 

## 2020-06-20 NOTE — Progress Notes (Signed)
Called to room to assist during endoscopic procedure.  Patient ID and intended procedure confirmed with present staff. Received instructions for my participation in the procedure from the performing physician.  

## 2020-06-20 NOTE — Patient Instructions (Signed)
Please read handouts provided. Continue present medications. Await pathology results. Office follow-up in 6-12 months, sooner if symptoms of active colitis return. Let Dr. Hilarie Fredrickson know if trouble swallowing persists.     YOU HAD AN ENDOSCOPIC PROCEDURE TODAY AT [facility]:   Refer to the procedure report that was given to you for any specific questions about what was found during the examination.  If the procedure report does not answer your questions, please call your gastroenterologist to clarify.  If you requested that your care partner not be given the details of your procedure findings, then the procedure report has been included in a sealed envelope for you to review at your convenience later.  YOU SHOULD EXPECT: Some feelings of bloating in the abdomen. Passage of more gas than usual.  Walking can help get rid of the air that was put into your GI tract during the procedure and reduce the bloating. If you had a lower endoscopy (such as a colonoscopy or flexible sigmoidoscopy) you may notice spotting of blood in your stool or on the toilet paper. If you underwent a bowel prep for your procedure, you may not have a normal bowel movement for a few days.  Please Note:  You might notice some irritation and congestion in your nose or some drainage.  This is from the oxygen used during your procedure.  There is no need for concern and it should clear up in a day or so.  SYMPTOMS TO REPORT IMMEDIATELY:   Following lower endoscopy (colonoscopy or flexible sigmoidoscopy):  Excessive amounts of blood in the stool  Significant tenderness or worsening of abdominal pains  Swelling of the abdomen that is new, acute  Fever of 100F or higher   Following upper endoscopy (EGD)  Vomiting of blood or coffee ground material  New chest pain or pain under the shoulder blades  Painful or persistently difficult swallowing  New shortness of breath  Fever of 100F or higher  Black,  tarry-looking stools  For urgent or emergent issues, a gastroenterologist can be reached at any hour by calling [phone]. Do not use MyChart messaging for urgent concerns.    DIET:  We do recommend a small meal at first, but then you may proceed to your regular diet.  Drink plenty of fluids but you should avoid alcoholic beverages for 24 hours.  ACTIVITY:  You should plan to take it easy for the rest of today and you should NOT DRIVE or use heavy machinery until tomorrow (because of the sedation medicines used during the test).    FOLLOW UP: Our staff will call the number listed on your records 48-72 hours following your procedure to check on you and address any questions or concerns that you may have regarding the information given to you following your procedure. If we do not reach you, we will leave a message.  We will attempt to reach you two times.  During this call, we will ask if you have developed any symptoms of COVID 19. If you develop any symptoms (ie: fever, flu-like symptoms, shortness of breath, cough etc.) before then, please call [phone].  If you test positive for Covid 19 in the 2 weeks post procedure, please call and report this information to [place].    If any biopsies were taken you will be contacted by phone or by letter within the next 1-3 weeks.  Please call us at [phone] if you have not heard about the biopsies in 3 weeks.  SIGNATURES/CONFIDENTIALITY: You and/or your care partner have signed paperwork which will be entered into your electronic medical record.  These signatures attest to the fact that that the information above on your After Visit Summary has been reviewed and is understood.  Full responsibility of the confidentiality of this discharge information lies with you and/or your care-partner.

## 2020-06-24 ENCOUNTER — Other Ambulatory Visit: Payer: Self-pay

## 2020-06-24 ENCOUNTER — Telehealth: Payer: Self-pay | Admitting: *Deleted

## 2020-06-24 DIAGNOSIS — K5100 Ulcerative (chronic) pancolitis without complications: Secondary | ICD-10-CM

## 2020-06-24 DIAGNOSIS — D649 Anemia, unspecified: Secondary | ICD-10-CM

## 2020-06-24 NOTE — Telephone Encounter (Signed)
  Follow up Call-  Call back number 06/20/2020  Post procedure Call Back phone  # [phone] cell  Permission to leave phone message Yes  Some recent data might be hidden     Patient questions:  Do you have a fever, pain , or abdominal swelling? Yes Pain Score  2-3. Pt c/o some mild pain after eating certain foods like "salad or beef". Pt states the pain subsides on it's own. RN instructed pt that she may be experiencing some gas pains as her system gets back to normal. Pt instructed to call back if pain worsens.   Have you tolerated food without any problems? Yes.    Have you been able to return to your normal activities? Yes.    Do you have any questions about your discharge instructions: Diet   No. Medications  No. Follow up visit  No.  Do you have questions or concerns about your Care? No.  Actions: * If pain score is 4 or above: No action needed, pain <4.  1. Have you developed a fever since your procedure? no  2.   Have you had an respiratory symptoms (SOB or cough) since your procedure? no  3.   Have you tested positive for COVID 19 since your procedure no  4.   Have you had any family members/close contacts diagnosed with the COVID 19 since your procedure?  no   If yes to any of these questions please route to Joylene John, RN and Joella Prince, RN

## 2020-06-25 NOTE — Progress Notes (Signed)
Addendum: Reviewed and agree with assessment and management plan. Jamisha Hoeschen M, MD  

## 2020-07-02 ENCOUNTER — Encounter: Payer: Self-pay | Admitting: Internal Medicine

## 2020-07-17 ENCOUNTER — Other Ambulatory Visit: Payer: Self-pay

## 2020-07-17 ENCOUNTER — Ambulatory Visit
Admission: RE | Admit: 2020-07-17 | Discharge: 2020-07-17 | Disposition: A | Discharge status: Home / Self Care | Payer: Medicare Other | Source: Ambulatory Visit | Attending: Family Medicine | Admitting: Family Medicine

## 2020-07-17 DIAGNOSIS — Z1231 Encounter for screening mammogram for malignant neoplasm of breast: Secondary | ICD-10-CM

## 2020-08-06 DIAGNOSIS — Z96652 Presence of left artificial knee joint: Secondary | ICD-10-CM | POA: Diagnosis not present

## 2020-08-06 DIAGNOSIS — M25562 Pain in left knee: Secondary | ICD-10-CM | POA: Diagnosis not present

## 2020-08-06 DIAGNOSIS — M1711 Unilateral primary osteoarthritis, right knee: Secondary | ICD-10-CM | POA: Diagnosis not present

## 2020-08-08 ENCOUNTER — Other Ambulatory Visit: Payer: Self-pay | Admitting: Internal Medicine

## 2020-08-13 ENCOUNTER — Encounter: Payer: Self-pay | Admitting: *Deleted

## 2020-08-20 ENCOUNTER — Other Ambulatory Visit: Payer: Self-pay | Admitting: Internal Medicine

## 2020-09-02 ENCOUNTER — Encounter: Payer: Self-pay | Admitting: Family Medicine

## 2020-09-02 ENCOUNTER — Other Ambulatory Visit: Payer: Self-pay

## 2020-09-02 ENCOUNTER — Ambulatory Visit (INDEPENDENT_AMBULATORY_CARE_PROVIDER_SITE_OTHER): Payer: Medicare Other | Admitting: Family Medicine

## 2020-09-02 VITALS — BP 132/80 | HR 84 | Temp 97.7°F | Resp 18 | Ht 61.0 in | Wt 208.4 lb

## 2020-09-02 DIAGNOSIS — Z01818 Encounter for other preprocedural examination: Secondary | ICD-10-CM

## 2020-09-02 NOTE — Progress Notes (Signed)
Subjective:    [name] is a 65 y.o. female who presents to the office today for a preoperative consultation at the request of surgeon Dr Alvan Dame who plans on performing R knee replacement on September 29. This consultation is requested for the specific conditions prompting preoperative evaluation (i.e. because of potential affect on operative risk): obesity. Planned anesthesia: general. The patient has the following known anesthesia issues:  no hx of anesthesia issues . Patients bleeding risk: no recent abnormal bleeding, no remote history of abnormal bleeding, and no use of Ca-channel blockers. Patient does not have objections to receiving blood products if needed.  The following portions of the patient's history were reviewed and updated as appropriate: allergies, current medications, past family history, past medical history, past social history, past surgical history, and problem list.  Review of Systems A comprehensive review of systems was negative except for: Neurological: positive for headaches    Objective:    BP 132/80   Pulse 84   Temp 97.7 F (36.5 C) (Temporal)   Resp 18   Ht 5' 1"  (1.549 m)   Wt 208 lb 6.4 oz (94.5 kg)   SpO2 98%   BMI 39.38 kg/m   General Appearance:    Alert, cooperative, no distress, appears stated age, obese  Head:    Normocephalic, without obvious abnormality, atraumatic  Eyes:    PERRL, conjunctiva/corneas clear, EOM's intact, fundi    benign, both eyes  Ears:    Normal TM's and external ear canals, both ears  Nose:   Deferred due to COVID  Throat:   Neck:   Supple, symmetrical, trachea midline, no adenopathy;    thyroid:  no enlargement/tenderness/nodules  Back:     Symmetric, no curvature, ROM normal, no CVA tenderness  Lungs:     Clear to auscultation bilaterally, respirations unlabored  Chest Wall:    No tenderness or deformity   Heart:    Regular rate and rhythm, S1 and S2 normal, no murmur, rub   or gallop  Breast Exam:    No  tenderness, masses, or nipple abnormality  Abdomen:     Soft, non-tender, bowel sounds active all four quadrants,    no masses, no organomegaly  Genitalia:    deferred  Rectal:    Extremities:   Extremities normal, atraumatic, no cyanosis or edema  Pulses:   2+ and symmetric all extremities  Skin:   Skin color, texture, turgor normal, no rashes or lesions  Lymph nodes:   Cervical, supraclavicular, and axillary nodes normal  Neurologic:   CNII-XII intact, normal strength, sensation and reflexes    throughout    Predictors of intubation difficulty:  Morbid obesity? no  Anatomically abnormal facies? no  Prominent incisors? no  Receding mandible? no  Short, thick neck? no  Neck range of motion: normal  Dentition: Chipped teeth present (scheduled to be removed on 8/4)  Cardiographics ECG: normal sinus rhythm, no blocks or conduction defects, no ischemic changes Echocardiogram: not done  Imaging Chest x-ray:  NA    Lab Review  Pending    Assessment:      65 y.o. female with planned surgery as above.   Known risk factors for perioperative complications: obesity   Difficulty with intubation is not anticipated.  Cardiac Risk Estimation: low  Current medications which may produce withdrawal symptoms if withheld perioperatively: none    Plan:    1. Preoperative workup as follows ECG, hemoglobin, hematocrit, electrolytes, creatinine, glucose, liver function studies. 2. Change in medication  regimen before surgery: none, continue medication regimen including morning of surgery, with sip of water. 3. Prophylaxis for cardiac events with perioperative beta-blockers: not indicated. 4. Invasive hemodynamic monitoring perioperatively: at the discretion of anesthesiologist. 5. Deep vein thrombosis prophylaxis postoperatively:regimen to be chosen by surgical team. 6. Surveillance for postoperative MI with ECG immediately postoperatively and on postoperative days 1 and 2 AND troponin levels  24 hours postoperatively and on day 4 or hospital discharge (whichever comes first): at the discretion of anesthesiologist. 7. Other measures:  none

## 2020-09-02 NOTE — Patient Instructions (Signed)
Schedule your complete physical in 6 months Go to [facility] and get your labs done [facility] notify you of your lab results and make any changes if needed We'll send the note, EKG, labs to Dr Alvan Dame Call with any questions or concerns Stay safe!  Stay healthy! Good luck!!!

## 2020-09-04 ENCOUNTER — Other Ambulatory Visit (INDEPENDENT_AMBULATORY_CARE_PROVIDER_SITE_OTHER): Payer: Medicare Other

## 2020-09-04 DIAGNOSIS — Z01818 Encounter for other preprocedural examination: Secondary | ICD-10-CM

## 2020-09-04 LAB — BASIC METABOLIC PANEL
BUN: 10 mg/dL (ref 6–23)
CO2: 27 mEq/L (ref 19–32)
Calcium: 9.3 mg/dL (ref 8.4–10.5)
Chloride: 105 mEq/L (ref 96–112)
Creatinine, Ser: 0.79 mg/dL (ref 0.40–1.20)
GFR: 79.99 mL/min (ref >60.00)
Glucose, Bld: 102 mg/dL — ABNORMAL HIGH (ref 70–99)
Potassium: 3.9 mEq/L (ref 3.5–5.1)
Sodium: 141 mEq/L (ref 135–145)

## 2020-09-04 LAB — TSH: TSH: 1.02 u[IU]/mL (ref 0.35–5.50)

## 2020-09-04 LAB — CBC WITH DIFFERENTIAL/PLATELET
Basophils Absolute: 0 10*3/uL (ref 0.0–0.1)
Basophils Relative: 0.3 % (ref 0.0–3.0)
Eosinophils Absolute: 0.2 10*3/uL (ref 0.0–0.7)
Eosinophils Relative: 3.3 % (ref 0.0–5.0)
HCT: 36.7 % (ref 36.0–46.0)
Hemoglobin: 11.6 g/dL — ABNORMAL LOW (ref 12.0–15.0)
Lymphocytes Relative: 35.4 % (ref 12.0–46.0)
Lymphs Abs: 1.9 10*3/uL (ref 0.7–4.0)
MCHC: 31.6 g/dL (ref 30.0–36.0)
MCV: 71.1 fl — ABNORMAL LOW (ref 78.0–100.0)
Monocytes Absolute: 0.3 10*3/uL (ref 0.1–1.0)
Monocytes Relative: 6.2 % (ref 3.0–12.0)
Neutro Abs: 3 10*3/uL (ref 1.4–7.7)
Neutrophils Relative %: 54.8 % (ref 43.0–77.0)
Platelets: 309 10*3/uL (ref 150.0–400.0)
RBC: 5.16 Mil/uL — ABNORMAL HIGH (ref 3.87–5.11)
RDW: 15.4 % (ref 11.5–15.5)
WBC: 5.5 10*3/uL (ref 4.0–10.5)

## 2020-09-04 LAB — LIPID PANEL
Cholesterol: 156 mg/dL (ref 0–200)
HDL: 58 mg/dL (ref >39.00)
LDL Cholesterol: 84 mg/dL (ref 0–99)
NonHDL: 97.95
Total CHOL/HDL Ratio: 3
Triglycerides: 68 mg/dL (ref 0.0–149.0)
VLDL: 13.6 mg/dL (ref 0.0–40.0)

## 2020-09-04 LAB — HEPATIC FUNCTION PANEL
ALT: 13 U/L (ref 0–35)
AST: 17 U/L (ref 0–37)
Albumin: 3.9 g/dL (ref 3.5–5.2)
Alkaline Phosphatase: 70 U/L (ref 39–117)
Bilirubin, Direct: 0.1 mg/dL (ref 0.0–0.3)
Total Bilirubin: 0.5 mg/dL (ref 0.2–1.2)
Total Protein: 7.2 g/dL (ref 6.0–8.3)

## 2020-09-09 ENCOUNTER — Other Ambulatory Visit: Payer: Self-pay | Admitting: Family Medicine

## 2020-09-09 DIAGNOSIS — K51011 Ulcerative (chronic) pancolitis with rectal bleeding: Secondary | ICD-10-CM

## 2020-09-11 ENCOUNTER — Ambulatory Visit (INDEPENDENT_AMBULATORY_CARE_PROVIDER_SITE_OTHER): Payer: Medicare Other | Admitting: Registered Nurse

## 2020-09-11 ENCOUNTER — Encounter: Payer: Self-pay | Admitting: Registered Nurse

## 2020-09-11 ENCOUNTER — Other Ambulatory Visit: Payer: Self-pay

## 2020-09-11 ENCOUNTER — Telehealth: Payer: Self-pay

## 2020-09-11 VITALS — HR 60 | Temp 98.3°F | Resp 18 | Ht 61.0 in | Wt 204.0 lb

## 2020-09-11 DIAGNOSIS — L02214 Cutaneous abscess of groin: Secondary | ICD-10-CM

## 2020-09-11 MED ORDER — SULFAMETHOXAZOLE-TRIMETHOPRIM 800-160 MG PO TABS: 1.0000 | ORAL_TABLET | Freq: Two times a day (BID) | ORAL | 0 refills | Status: DC

## 2020-09-11 MED ORDER — MUPIROCIN CALCIUM 2 % EX CREA: 1.0000 "application " | TOPICAL_CREAM | Freq: Two times a day (BID) | CUTANEOUS | 0 refills | Status: DC

## 2020-09-11 NOTE — Patient Instructions (Addendum)
Ms. [name] -   Sorry you're going through this!  Warm compresses, ice, tylenol for relief for now  Using bactrim and mupirocin for antibiotics.  Bactrim - 1 pill twice daily for one week Mupirocin - 1 applicatoin twice daily for one week  Even if you're feeling better, finish out the whole course  Let me know if things get worse or do not get better  Thank you  Rich     If you have lab work done today you will be contacted with your lab results within the next 2 weeks.  If you have not heard from [place] then please contact us. The fastest way to get your results is to register for My Chart.   IF you received an x-ray today, you will receive an invoice from [facility]. Please contact [facility] at [phone] with questions or concerns regarding your invoice.   IF you received labwork today, you will receive an invoice from [facility]. Please contact [facility] at [phone] with questions or concerns regarding your invoice.   Our billing staff will not be able to assist you with questions regarding bills from these companies.  You will be contacted with the lab results as soon as they are available. The fastest way to get your results is to activate your My Chart account. Instructions are located on the last page of this paperwork. If you have not heard from [place] regarding the results in 2 weeks, please contact this office.

## 2020-09-11 NOTE — Telephone Encounter (Signed)
Pt was in the office today to see Richard and he prescribed mupirocin cream (BACTROBAN) 2 % and it is 174.00 with her Medicare is there any other option of a different med that she can be prescribed this cost to much for her?   Pt call back [phone]

## 2020-09-11 NOTE — Progress Notes (Signed)
Acute Office Visit  Subjective:    Patient ID: [name], female    DOB: 08/26/1955, 65 y.o.   MRN: [id number]  Chief Complaint  Patient presents with   Cyst    Patient states she believe she has a cyst on the outside of her vaginal area . Patient states she noticed it Monday and just popped up with some pain, but no drainage.     HPI Patient is in today for cyst  R outer labia Onset Monday Has grown, more painful Pt denies any drainage Has had similar in groin before - usually ingrown hair that resolves spontaneously No fevers or local adenopathy No other systemic, vaginal, or urinary symptoms.  Past Medical History:  Diagnosis Date   Arthritis    Cataract    bilateral cataracts   COVID-19    GERD (gastroesophageal reflux disease)    Microcytic anemia    Migraine    Scoliosis    Thalassemia    Thalassemia    Ulcerative pancolitis (HCC)    Colonoscopy with Dr. Hilarie Fredrickson   Vitamin D deficiency     Past Surgical History:  Procedure Laterality Date   ABDOMINAL HYSTERECTOMY     partial   ANTERIOR LAT LUMBAR FUSION N/A 01/28/2016   Procedure: ANTERIOR LATERAL LUMBAR FUSION 2 LEVELS XLIF L2-4;  Surgeon: Melina Schools, MD;  Location: [facility];  Service: Orthopedics;  Laterality: N/A;   BACK SURGERY     BREAST EXCISIONAL BIOPSY Bilateral    benign   BREAST REDUCTION SURGERY Bilateral 06/16/2017   Procedure: BILATERAL MAMMARY REDUCTION  (BREAST);  Surgeon: Wallace Going, DO;  Location: [facility];  Service: Plastics;  Laterality: Bilateral;   BREAST SURGERY Bilateral    cysts 65 yrs old   COLONOSCOPY  20 + years ago   In [facility]   KNEE SURGERY Left 12-13,11-10   meniscal repairs   LUMBAR FUSION  01/28/2016   ANTERIOR LATERAL LUMBAR FUSION 2 LEVELS XLIF L2-4 (N/A)   TOTAL KNEE ARTHROPLASTY Left 01/14/2015   Procedure: TOTAL KNEE ARTHROPLASTY;  Surgeon: Paralee Cancel, MD;  Location: [facility];  Service: Orthopedics;  Laterality: Left;     Family History  Problem Relation Age of Onset   Sickle cell trait Mother    Diabetes Brother    Diabetes Daughter    Asthma Other    Colon cancer Neg Hx    Esophageal cancer Neg Hx    Rectal cancer Neg Hx    Stomach cancer Neg Hx     Social History   Socioeconomic History   Marital status: Divorced    Spouse name: Not on file   Number of children: 3   Years of education: college   Highest education level: Not on file  Occupational History    Employer: [employer]    Comment: Computers  Tobacco Use   Smoking status: Never   Smokeless tobacco: Never  Vaping Use   Vaping Use: Never used  Substance and Sexual Activity   Alcohol use: No    Alcohol/week: 0.0 standard drinks   Drug use: No   Sexual activity: Not on file  Other Topics Concern   Not on file  Social History Narrative   Patient lives at home alone and she is divorced. Patient works with computers. Patient has a college education.   Right handed.   Caffeine.- one cup daily.     Social Determinants of Health   Financial Resource Strain: Not on file  Food Insecurity: Not on file  Transportation Needs: Not on file  Physical Activity: Not on file  Stress: Not on file  Social Connections: Not on file  Intimate Partner Violence: Not on file    Outpatient Medications Prior to Visit  Medication Sig Dispense Refill   acetaminophen (TYLENOL) 650 MG CR tablet Tylenol 8 Hour 650 mg tablet,extended release     calcium carbonate (TUMS - DOSED IN MG ELEMENTAL CALCIUM) 500 MG chewable tablet Chew 1 tablet by mouth daily. As needed     folic acid (FOLVITE) 1 MG tablet TAKE 1 TABLET(1 MG) BY MOUTH DAILY 90 tablet 1   pantoprazole (PROTONIX) 40 MG tablet Take 1 tablet (40 mg total) by mouth 2 (two) times daily. 60 tablet 2   sulfaSALAzine (AZULFIDINE) 500 MG tablet Take 2 tablets (1,000 mg total) by mouth 2 (two) times daily. TAKE 1 TABLET BY MOUTH TWICE DAILY FOR 1 WEEK. INCREASE TO 2 TABLETS BY MOUTH 2  TIMES DAILY THEREAFTER 120 tablet 3   SUMAtriptan (IMITREX) 100 MG tablet Take 1 tablet (100 mg total) by mouth every 2 (two) hours as needed for migraine. 10 tablet 6   No facility-administered medications prior to visit.    Allergies  Allergen Reactions   No Known Allergies     Review of Systems  Constitutional: Negative.   HENT: Negative.    Eyes: Negative.   Respiratory: Negative.    Cardiovascular: Negative.   Gastrointestinal: Negative.   Genitourinary: Negative.   Musculoskeletal: Negative.   Skin: Negative.   Neurological: Negative.   Psychiatric/Behavioral: Negative.    All other systems reviewed and are negative.     Objective:    Physical Exam Vitals and nursing note reviewed.  Constitutional:      General: She is not in acute distress.    Appearance: Normal appearance. She is not ill-appearing, toxic-appearing or diaphoretic.  Cardiovascular:     Rate and Rhythm: Normal rate and regular rhythm.     Pulses: Normal pulses.     Heart sounds: Normal heart sounds. No murmur heard.   No friction rub. No gallop.  Pulmonary:     Effort: Pulmonary effort is normal. No respiratory distress.     Breath sounds: Normal breath sounds. No stridor. No wheezing, rhonchi or rales.  Chest:     Chest wall: No tenderness.  Genitourinary:   Skin:    General: Skin is warm and dry.     Capillary Refill: Capillary refill takes less than 2 seconds.  Neurological:     General: No focal deficit present.     Mental Status: She is alert and oriented to person, place, and time. Mental status is at baseline.  Psychiatric:        Mood and Affect: Mood normal.        Behavior: Behavior normal.        Thought Content: Thought content normal.        Judgment: Judgment normal.    Pulse 60   Temp 98.3 F (36.8 C) (Temporal)   Resp 18   Ht 5' 1"  (1.549 m)   Wt 204 lb (92.5 kg)   SpO2 99%   BMI 38.55 kg/m  Wt Readings from Last 3 Encounters:  09/11/20 204 lb (92.5 kg)   09/02/20 208 lb 6.4 oz (94.5 kg)  06/20/20 211 lb (95.7 kg)    There are no preventive care reminders to display for this patient.  There are no preventive care reminders to display for  this patient.   Lab Results  Component Value Date   TSH 1.02 09/04/2020   Lab Results  Component Value Date   WBC 5.5 09/04/2020   HGB 11.6 (L) 09/04/2020   HCT 36.7 09/04/2020   MCV 71.1 (L) 09/04/2020   PLT 309.0 09/04/2020   Lab Results  Component Value Date   NA 141 09/04/2020   K 3.9 09/04/2020   CO2 27 09/04/2020   GLUCOSE 102 (H) 09/04/2020   BUN 10 09/04/2020   CREATININE 0.79 09/04/2020   BILITOT 0.5 09/04/2020   ALKPHOS 70 09/04/2020   AST 17 09/04/2020   ALT 13 09/04/2020   PROT 7.2 09/04/2020   ALBUMIN 3.9 09/04/2020   CALCIUM 9.3 09/04/2020   ANIONGAP 6 01/20/2016   GFR 79.99 09/04/2020   Lab Results  Component Value Date   CHOL 156 09/04/2020   Lab Results  Component Value Date   HDL 58.00 09/04/2020   Lab Results  Component Value Date   LDLCALC 84 09/04/2020   Lab Results  Component Value Date   TRIG 68.0 09/04/2020   Lab Results  Component Value Date   CHOLHDL 3 09/04/2020   No results found for: HGBA1C     Assessment & Plan:   Problem List Items Addressed This Visit   None Visit Diagnoses     Abscess, groin    -  Primary   Relevant Medications   sulfamethoxazole-trimethoprim (BACTRIM DS) 800-160 MG tablet   mupirocin cream (BACTROBAN) 2 %        Meds ordered this encounter  Medications   sulfamethoxazole-trimethoprim (BACTRIM DS) 800-160 MG tablet    Sig: Take 1 tablet by mouth 2 (two) times daily.    Dispense:  14 tablet    Refill:  0    Order Specific Question:   Supervising Provider    Answer:   Carlota Raspberry, JEFFREY R [[id number]]   mupirocin cream (BACTROBAN) 2 %    Sig: Apply 1 application topically 2 (two) times daily.    Dispense:  15 g    Refill:  0    Order Specific Question:   Supervising Provider    Answer:   Carlota Raspberry, JEFFREY R  [[id number]]   PLAN Abscess, infection appears to have some drainage. Reviewed risks of I&D, will pursue abx treatment first with bactrim po bid for one week and topical mupirocin bid for one week Reviewed nonpharm care. Recommend warm compresses 15 minutes at a time 3-4 times daily.  Return precautions reviewed. Can consider I&D or referral to derm Does not appear to be bartholin's gland duct cyst Patient encouraged to call clinic with any questions, comments, or concerns.   Maximiano Coss, NP

## 2020-09-12 ENCOUNTER — Ambulatory Visit: Payer: Medicare Other | Admitting: Registered Nurse

## 2020-09-12 ENCOUNTER — Other Ambulatory Visit: Payer: Self-pay

## 2020-09-12 MED ORDER — MUPIROCIN 2 % EX OINT: 1.0000 "application " | TOPICAL_OINTMENT | Freq: Two times a day (BID) | CUTANEOUS | 0 refills | Status: DC

## 2020-09-12 NOTE — Telephone Encounter (Signed)
Ok to send mupirocin ointment 2% topical twice daily   Thanks  Rich

## 2020-09-12 NOTE — Telephone Encounter (Signed)
Ointment sent to patient's pharmacy. Patient is aware.

## 2020-09-22 ENCOUNTER — Telehealth: Payer: Self-pay | Admitting: Family Medicine

## 2020-09-22 NOTE — Chronic Care Management (AMB) (Signed)
  Chronic Care Management   Note  09/22/2020 Name: [name] MRN: [id number] DOB: 30-Jan-1956  [name] is a 65 y.o. year old female who is a primary care patient of Birdie Riddle, Aundra Millet, MD. I reached out to [employer] by phone today in response to a referral sent by [name] PCP, Patricia Minium, MD.   [name] was given information about Chronic Care Management services today including:  CCM service includes personalized support from designated clinical staff supervised by her physician, including individualized plan of care and coordination with other care providers 24/7 contact phone numbers for assistance for urgent and routine care needs. Service will only be billed when office clinical staff spend 20 minutes or more in a month to coordinate care. Only one practitioner may furnish and bill the service in a calendar month. The patient may stop CCM services at any time (effective at the end of the month) by phone call to the office staff.   Patient agreed to services and verbal consent obtained.   Follow up plan:   [name]

## 2020-09-23 ENCOUNTER — Other Ambulatory Visit: Payer: Self-pay

## 2020-09-23 DIAGNOSIS — R1319 Other dysphagia: Secondary | ICD-10-CM

## 2020-09-23 MED ORDER — PANTOPRAZOLE SODIUM 40 MG PO TBEC: 40.0000 mg | DELAYED_RELEASE_TABLET | Freq: Two times a day (BID) | ORAL | 2 refills | Status: DC

## 2020-10-01 ENCOUNTER — Ambulatory Visit: Payer: Medicare Other | Admitting: Family Medicine

## 2020-10-07 DIAGNOSIS — M25561 Pain in right knee: Secondary | ICD-10-CM | POA: Diagnosis not present

## 2020-10-07 DIAGNOSIS — M25661 Stiffness of right knee, not elsewhere classified: Secondary | ICD-10-CM | POA: Diagnosis not present

## 2020-10-07 DIAGNOSIS — M1711 Unilateral primary osteoarthritis, right knee: Secondary | ICD-10-CM | POA: Diagnosis not present

## 2020-10-16 ENCOUNTER — Telehealth: Payer: Self-pay

## 2020-10-16 NOTE — Progress Notes (Signed)
Chronic Care Management Pharmacy Assistant   Name: [name]  MRN: [id number] DOB: 05-19-1955   [name] is an 65 y.o. year old female who presents for her initial CCM visit with the clinical pharmacist.   Reason for Encounter: Chart Prep for Initial CPP Visit     Recent office visits:  09/11/20 - Nedra Hai, NP - Family Medicine - Abscess - mupirocin cream (BACTROBAN) 2 % prescribed. Sulfamethoxazole-trimethoprim (BACTRIM DS) 800-160 MG tablet Take 1 tablet by mouth 2 (two) times daily prescribed. Follow up if needed.  09/02/20 [name], MD - Family Medicine - Pre Op Exam - Labs were ordered. EKG was performed. No medication changes. Follow up not indicated. EGD ordered. Follow up not indicated.  06/13/20 [name], MD (PCP) - Family Medicine - Esophageal dysphagia - Sucralfate (CARAFATE) 1 g tablet Take 1 tablet (1 g total) by mouth 4 (four) times daily  prescribed. Pantoprazole (PROTONIX) 40 MG tablet Take 1 tablet (40 mg total) by mouth 2 (two) times daily prescribed. GI referral placed. Follow up as needed.   Recent consult visits:  06/16/20 Carl Best, MD - Gastroenterology - GERD - Labs were ordered. Follow up after EGD / Colonoscopy.  06/04/20 Paralee Cancel, MD - Orthopedic Surgery - Unilateral Primary Osteoarthritis - No notes available.   05/15/20 Suella Broad, MD - Physical Medicine and Rehab - No notes available.  05/05/20 Zenovia Jarred, MD - Gastroenterology - Ulcerative Colitis - Labs were ordered. Referral to GI placed. mesalamine (LIALDA) 1.2 g EC tablet Take 4 tablets (4.8 g total) by mouth daily with breakfast prescribed. Pantoprazole (PROTONIX) 40 MG tablet 1 tablet daily prescribed. Sodium Sulfate-Mag Sulfate-KCl (SUTAB) [phone] MG Tab prescribed for colonoscopy prep. Follow up in 2 weeks if no improvement.   04/28/20 Melina Schools - Orthopedic Surgery - Other Low back Pain - No notes available.   04/23/20 Paralee Cancel, MD -  Orthopedic Surgery - No notes available.     Hospital visits:  None in previous 6 months  Medications: Outpatient Encounter Medications as of 10/16/2020  Medication Sig   mupirocin ointment (BACTROBAN) 2 % Apply 1 application topically 2 (two) times daily.   acetaminophen (TYLENOL) 650 MG CR tablet Tylenol 8 Hour 650 mg tablet,extended release   calcium carbonate (TUMS - DOSED IN MG ELEMENTAL CALCIUM) 500 MG chewable tablet Chew 1 tablet by mouth daily. As needed   folic acid (FOLVITE) 1 MG tablet TAKE 1 TABLET(1 MG) BY MOUTH DAILY   mupirocin cream (BACTROBAN) 2 % Apply 1 application topically 2 (two) times daily.   pantoprazole (PROTONIX) 40 MG tablet Take 1 tablet (40 mg total) by mouth 2 (two) times daily.   sulfamethoxazole-trimethoprim (BACTRIM DS) 800-160 MG tablet Take 1 tablet by mouth 2 (two) times daily.   sulfaSALAzine (AZULFIDINE) 500 MG tablet Take 2 tablets (1,000 mg total) by mouth 2 (two) times daily. TAKE 1 TABLET BY MOUTH TWICE DAILY FOR 1 WEEK. INCREASE TO 2 TABLETS BY MOUTH 2 TIMES DAILY THEREAFTER   SUMAtriptan (IMITREX) 100 MG tablet Take 1 tablet (100 mg total) by mouth every 2 (two) hours as needed for migraine.   No facility-administered encounter medications on file as of 10/16/2020.     Have you seen any other providers since your last visit? No  Any changes in your medications or health? No  Any side effects from any medications? No  Do you have an symptoms or problems not managed by your medications? No  Any concerns about your  health right now? No  Has your provider asked that you check blood pressure, blood sugar, or follow special diet at home? No  Do you get any type of exercise on a regular basis? Yes. She is in PT for her legs.  Can you think of a goal you would like to reach for your health?  Patient reports she would like to work on weight loss after her upcoming surgery in September.   Do you have any problems getting your medications?  No  Is there anything that you would like to discuss during the appointment? No   *Please bring medications and supplements to appointment   *Patient confirm appointment date and time with CPP for 10/22/20 @ 2:00 pm    Star Rating Drugs: No Star Rating Drugs noted.   Future Appointments  Date Time Provider [place]  10/22/2020  2:00 PM [facility] CCM PHARMACIST [facility]  10/31/2020  9:00 AM [facility] PAT 2 [facility] None    Jobe Gibbon, CCMA Clinical Pharmacist Assistant  [phone]  Time Spent: 45 minutes

## 2020-10-22 ENCOUNTER — Ambulatory Visit (INDEPENDENT_AMBULATORY_CARE_PROVIDER_SITE_OTHER): Payer: Medicare Other

## 2020-10-22 DIAGNOSIS — M1711 Unilateral primary osteoarthritis, right knee: Secondary | ICD-10-CM

## 2020-10-22 DIAGNOSIS — K51011 Ulcerative (chronic) pancolitis with rectal bleeding: Secondary | ICD-10-CM

## 2020-10-22 NOTE — Progress Notes (Signed)
Chronic Care Management Pharmacy Note  10/22/2020 Name:  [name] MRN:  [id number] DOB:  06-19-1955  Summary: -no Rx changes  Subjective: [name] is an 65 y.o. year old female who is a primary patient of Tabori, Aundra Millet, MD.  The CCM team was consulted for assistance with disease management and care coordination needs.    Engaged with patient by telephone for initial visit in response to provider referral for pharmacy case management and/or care coordination services.   Consent to Services:  The patient was given information about Chronic Care Management services, agreed to services, and gave verbal consent prior to initiation of services.  Please see initial visit note for detailed documentation.   Patient Care Team: Midge Minium, MD as PCP - General Pyrtle, Lajuan Lines, MD as Consulting Physician (Gastroenterology) Suella Broad, MD as Consulting Physician (Physical Medicine and Rehabilitation) Madelin Rear, [facility] as Pharmacist (Pharmacist)  Hospital visits: None in previous 6 months  Objective:  Lab Results  Component Value Date   CREATININE 0.79 09/04/2020   CREATININE 0.85 06/16/2020   CREATININE 0.74 05/05/2020    No results found for: HGBA1C Last diabetic Eye exam: No results found for: HMDIABEYEEXA  Last diabetic Foot exam: No results found for: HMDIABFOOTEX      Component Value Date/Time   CHOL 156 09/04/2020 1059   TRIG 68.0 09/04/2020 1059   HDL 58.00 09/04/2020 1059   CHOLHDL 3 09/04/2020 1059   VLDL 13.6 09/04/2020 1059   LDLCALC 84 09/04/2020 1059   LDLCALC 100 (H) 05/13/2017 1424    Hepatic Function Latest Ref Rng & Units 09/04/2020 06/16/2020 05/05/2020  Total Protein 6.0 - 8.3 g/dL 7.2 8.1 7.4  Albumin 3.5 - 5.2 g/dL 3.9 4.1 4.0  AST 0 - 37 U/L _0 ALT 0 - 35 U/L _1 Alk Phosphatase 39 - 117 U/L 70 83 85  Total Bilirubin 0.2 - 1.2 mg/dL 0.5 0.6 0.3  Bilirubin, Direct 0.0 - 0.3 mg/dL 0.1 - -    Lab Results   Component Value Date/Time   TSH 1.02 09/04/2020 10:59 AM   TSH 0.99 05/13/2017 02:24 PM    CBC Latest Ref Rng & Units 09/04/2020 06/16/2020 05/05/2020  WBC 4.0 - 10.5 K/uL 5.5 7.2 5.7  Hemoglobin 12.0 - 15.0 g/dL 11.6(L) 12.1 11.7(L)  Hematocrit 36.0 - 46.0 % 36.7 38.0 36.7  Platelets 150.0 - 400.0 K/uL 309.0 352.0 292.0    Lab Results  Component Value Date/Time   VD25OH 11.80 (L) 04/14/2020 09:46 AM   VD25OH 15 (L) 11/08/2016 04:13 PM   VD25OH 20 (L) 05/09/2015 04:04 PM   VD25OH 18.02 (L) 10/24/2013 09:38 AM    Clinical ASCVD: No  The 10-year ASCVD risk score Mikey Bussing DC Jr., et al., 2013) is: 5.2%   Values used to calculate the score:     Age: 29 years     Sex: Female     Is Non-Hispanic African American: Yes     Diabetic: No     Tobacco smoker: No     Systolic Blood Pressure: 409 mmHg     Is BP treated: No     HDL Cholesterol: 58 mg/dL     Total Cholesterol: 156 mg/dL    Other: (CHADS2VASc if Afib, PHQ9 if depression, MMRC or CAT for COPD, ACT, DEXA)  Social History   Tobacco Use  Smoking Status Never  Smokeless Tobacco Never   BP Readings from Last 3 Encounters:  09/02/20 132/80  06/20/20 (!) 145/92  06/16/20 132/80   Pulse Readings from Last 3 Encounters:  09/11/20 60  09/02/20 84  06/20/20 84   Wt Readings from Last 3 Encounters:  09/11/20 204 lb (92.5 kg)  09/02/20 208 lb 6.4 oz (94.5 kg)  06/20/20 211 lb (95.7 kg)    Assessment: Review of patient past medical history, allergies, medications, health status, including review of consultants reports, laboratory and other test data, was performed as part of comprehensive evaluation and provision of chronic care management services.   SDOH:  (Social Determinants of Health) assessments and interventions performed: Yes   CCM Care Plan  Allergies  Allergen Reactions   No Known Allergies     Medications Reviewed Today     Reviewed by Madelin Rear, [facility] (Pharmacist) on 10/22/20 at 1514  Med List Status:  <None>   Medication Order Taking? Sig Documenting Provider Last Dose Status Informant  acetaminophen (TYLENOL) 650 MG CR tablet [id number]  Tylenol 8 Hour 650 mg tablet,extended release [provider]  Active   calcium carbonate (TUMS - DOSED IN MG ELEMENTAL CALCIUM) 500 MG chewable tablet [id number]  Chew 1 tablet by mouth daily. As needed [provider]  Active   folic acid (FOLVITE) 1 MG tablet [id number] Yes TAKE 1 TABLET(1 MG) BY MOUTH DAILY Pyrtle, Lajuan Lines, MD Taking Active   mupirocin cream (BACTROBAN) 2 % [id number]  Apply 1 application topically 2 (two) times daily. Maximiano Coss, NP  Active   mupirocin ointment (BACTROBAN) 2 % [id number]  Apply 1 application topically 2 (two) times daily. Maximiano Coss, NP  Active   pantoprazole (PROTONIX) 40 MG tablet [id number] Yes Take 1 tablet (40 mg total) by mouth 2 (two) times daily. Midge Minium, MD Taking Active   sulfaSALAzine (AZULFIDINE) 500 MG tablet [id number] Yes Take 2 tablets (1,000 mg total) by mouth 2 (two) times daily. TAKE 1 TABLET BY MOUTH TWICE DAILY FOR 1 WEEK. INCREASE TO 2 TABLETS BY MOUTH 2 TIMES DAILY THEREAFTER Pyrtle, Lajuan Lines, MD Taking Active   SUMAtriptan (IMITREX) 100 MG tablet [id number]  Take 1 tablet (100 mg total) by mouth every 2 (two) hours as needed for migraine. Midge Minium, MD  Active             Patient Active Problem List   Diagnosis Date Noted   Osteoarthritis of right knee 07/15/2017   Status post bilateral breast reduction 06/23/2017   Morbid obesity ([place]) 11/08/2016   Vitamin D deficiency 11/08/2016   Back pain 01/28/2016   S/P left TKA 01/14/2015   S/P knee replacement 01/14/2015   Thoracic back pain 04/05/2014   Beta thalassemia, heterozygous 03/11/2014   Right lumbar radiculopathy 11/23/2013   Ulcerative colitis, universal ([place]) 07/25/2012   Physical exam 05/19/2012   THYROIDITIS 01/27/2010   MIGRAINE HEADACHE 01/27/2010   ALLERGIC RHINITIS 12/31/2009   ASTHMA  12/31/2009   PALPITATIONS 12/31/2009   SHORTNESS OF BREATH 12/31/2009   Immunization History  Administered Date(s) Administered   PFIZER(Purple Top)SARS-COV-2 Vaccination 06/02/2019, 06/26/2019   Tdap 10/24/2013    Conditions to be addressed/monitored: Asthma, osteoarthritis, UC, GERD, migraines   Care Plan : ccm pharmacy care plan  Updates made by Madelin Rear, [facility] since 10/22/2020 12:00 AM     Problem: Asthma, osteoarthritis, UC, GERD, migraines   Priority: High     Long-Range Goal: disease management   Start Date: 10/22/2020  This Visit's Progress: On track  Priority: High  Note:   Pharmacist Clinical Goal(s):  Patient will  verbalize ability to afford treatment regimen contact provider office for questions/concerns as evidenced notation of same in electronic health record through collaboration with PharmD and provider.   Interventions: 1:1 collaboration with Midge Minium, MD regarding development and update of comprehensive plan of care as evidenced by provider attestation and co-signature Inter-disciplinary care team collaboration (see longitudinal plan of care) Comprehensive medication review performed; medication list updated in electronic medical record  asthma (Goal: control symptoms and prevent exacerbations) -Controlled -Current treatment  No current therapies  -Medications previously tried: none on file  -Pulmonary function testing: n/a -Exacerbations requiring treatment in last 6 months: 0 -Patient denies consistent use of maintenance inhaler -Frequency of rescue inhaler use: 0 -Counseled on When to use rescue inhaler -No rx changes   Osteoarthritis of right knee (Goal: ensure safe medication use) -Not ideally controlled -Upcoming total knee arthroscopy (right) 11/13/2020 -Current treatment  Tylenol 8 Hour 650 mg tablet,extended release as needed -Medications previously tried: diclofenac (stopped due to GI risks)  -Recommended to continue current  medication  GERD/UC (minimize symptoms) -Controlled -Sees Dr Hilarie Fredrickson for UC  -Previously taking omeprazole OTC, switched to pantoprazole Rx. Aware of GI triggers and understands to avoid NSAIDs - Tylenol is her first line medication.  -Current regimen Pantoprazole 40 mg twice daily (GERD) Sulfasalazine 500 mg - 2 tabs twice daily (UC) -Reviewed concerns/side effects - no problems noted -No Rx changes   Health Maintenance -Vaccine gaps: covid booster (plans to get after surgery), influenza (does not receive) -Educated on Vitamin D 3 use - agreeable to picking up 1000 units and taking daily -Goal of weight loss following knee surgery - may consider silver sneakers -Counseled on diet and exercise extensively  Patient Goals/Self-Care Activities Patient will:  - take medications as prescribed target a minimum of 150 minutes of moderate intensity exercise weekly  Follow Up Plan: cpp f/u 8 months, hc 6 months  Medication Assistance: None required.  Patient affirms current coverage meets needs. Patient's preferred pharmacy is:  [id number] - [place], [place] - [address] [place] [address] Phone: [phone] Fax: [phone]  Follow Up:  Patient agrees to Care Plan and Follow-up.    Madelin Rear, PharmD, CPP Clinical Pharmacist Practitioner  [facility]  [phone]

## 2020-10-22 NOTE — Patient Instructions (Signed)
Ms. Canny,  Thank you for talking with me today. I have included our care plan/goals in the following pages.   Please review and call me at [phone] with any questions.  Thanks! Ellin Mayhew, PharmD, CPP Clinical Pharmacist Practitioner  [phone]  Care Plan : ccm pharmacy care plan  Updates made by Madelin Rear, [facility] since 10/22/2020 12:00 AM     Problem: Asthma, osteoarthritis, UC, GERD, migraines   Priority: High     Long-Range Goal: disease management   Start Date: 10/22/2020  This Visit's Progress: On track  Priority: High  Note:   Pharmacist Clinical Goal(s):  Patient will verbalize ability to afford treatment regimen contact provider office for questions/concerns as evidenced notation of same in electronic health record through collaboration with PharmD and provider.   Interventions: 1:1 collaboration with Midge Minium, MD regarding development and update of comprehensive plan of care as evidenced by provider attestation and co-signature Inter-disciplinary care team collaboration (see longitudinal plan of care) Comprehensive medication review performed; medication list updated in electronic medical record  asthma (Goal: control symptoms and prevent exacerbations) -Controlled -Current treatment  No current therapies  -Medications previously tried: none on file  -Pulmonary function testing: n/a -Exacerbations requiring treatment in last 6 months: 0 -Patient denies consistent use of maintenance inhaler -Frequency of rescue inhaler use: 0 -Counseled on When to use rescue inhaler -No rx changes   Osteoarthritis of right knee (Goal: ensure safe medication use) -Not ideally controlled -Upcoming total knee arthroscopy (right) 11/13/2020 -Current treatment  Tylenol 8 Hour 650 mg tablet,extended release as needed -Medications previously tried: diclofenac (stopped due to GI risks)  -Recommended to continue current medication  GERD/UC (minimize  symptoms) -Controlled -Sees Dr Hilarie Fredrickson for UC  -Previously taking omeprazole OTC, switched to pantoprazole Rx. Aware of GI triggers and understands to avoid NSAIDs - Tylenol is her first line medication.  -Current regimen Pantoprazole 40 mg twice daily (GERD) Sulfasalazine 500 mg - 2 tabs twice daily (UC) -Reviewed concerns/side effects - no problems noted -No Rx changes   Health Maintenance -Vaccine gaps: covid booster (plans to get after surgery), influenza (does not receive) -Educated on Vitamin D 3 use - agreeable to picking up 1000 units and taking daily -Goal of weight loss following knee surgery - may consider silver sneakers -Counseled on diet and exercise extensively  Patient Goals/Self-Care Activities Patient will:  - take medications as prescribed target a minimum of 150 minutes of moderate intensity exercise weekly  Follow Up Plan: cpp f/u 8 months, hc 6 months  Medication Assistance: None required.  Patient affirms current coverage meets needs. Patient's preferred pharmacy is:  [id number] - [place], [place] - [address] AT [place] [address] [place] [address] Phone: [phone] Fax: [phone]  Follow Up:  Patient agrees to Care Plan and Follow-up.     The patient was given the following information about Chronic Care Management services today, agreed to services, and gave verbal consent: 1. CCM service includes personalized support from designated clinical staff supervised by the primary care provider, including individualized plan of care and coordination with other care providers 2. 24/7 contact phone numbers for assistance for urgent and routine care needs. 3. Service will only be billed when office clinical staff spend 20 minutes or more in a month to coordinate care. 4. Only one practitioner may furnish and bill the service in a calendar month. 5.The patient  may stop CCM services at any time (effective  at the end of the month) by phone call to the office staff. 6. The patient will be responsible for cost sharing (co-pay) of up to 20% of the service fee (after annual deductible is met). Patient agreed to services and consent obtained.  The patient verbalized understanding of instructions provided today and agreed to receive a MyChart copy of patient instruction and/or educational materials. Telephone follow up appointment with pharmacy team member scheduled for: See next appointment with "Care Management Staff" under "What's Next" below. Food Choices for Gastroesophageal Reflux Disease, Adult When you have gastroesophageal reflux disease (GERD), the foods you eat and your eating habits are very important. Choosing the right foods can help ease your discomfort. Think about working with a food expert (dietitian) to help you make good choices. What are tips for following this plan? Reading food labels Look for foods that are low in saturated fat. Foods that may help with your symptoms include: Foods that have less than 5% of daily value (DV) of fat. Foods that have 0 grams of trans fat. Cooking Do not fry your food. Cook your food by baking, steaming, grilling, or broiling. These are all methods that do not need a lot of fat for cooking. To add flavor, try to use herbs that are low in spice and acidity. Meal planning  Choose healthy foods that are low in fat, such as: Fruits and vegetables. Whole grains. Low-fat dairy products. Lean meats, fish, and poultry. Eat small meals often instead of eating 3 large meals each day. Eat your meals slowly in a place where you are relaxed. Avoid bending over or lying down until 2-3 hours after eating. Limit high-fat foods such as fatty meats or fried foods. Limit your intake of fatty foods, such as oils, butter, and shortening. Avoid the following as told by your doctor: Foods that cause symptoms. These may be different for different people. Keep a food  diary to keep track of foods that cause symptoms. Alcohol. Drinking a lot of liquid with meals. Eating meals during the 2-3 hours before bed. Lifestyle Stay at a healthy weight. Ask your doctor what weight is healthy for you. If you need to lose weight, work with your doctor to do so safely. Exercise for at least 30 minutes on 5 or more days each week, or as told by your doctor. Wear loose-fitting clothes. Do not smoke or use any products that contain nicotine or tobacco. If you need help quitting, ask your doctor. Sleep with the head of your bed higher than your feet. Use a wedge under the mattress or blocks under the bed frame to raise the head of the bed. Chew sugar-free gum after meals. What foods should eat? Eat a healthy, well-balanced diet of fruits, vegetables, whole grains, low-fat dairy products, lean meats, fish, and poultry. Each person is different. Foods that may cause symptoms in one person may not cause any symptoms in another person. Work with your doctor to find foods that are safe for you. The items listed above may not be a complete list of what you can eat and drink. Contact a food expert for more options. What foods should I avoid? Limiting some of these foods may help in managing the symptoms of GERD. Everyone is different. Talk with a food expert or your doctor to help you find the exact foods to avoid, if any. Fruits Any fruits prepared with added fat. Any fruits that cause symptoms. For  some people, this may include citrus fruits, such as oranges, grapefruit, pineapple, and lemons. Vegetables Deep-fried vegetables. [place] fries. Any vegetables prepared with added fat. Any vegetables that cause symptoms. For some people, this may include tomatoes and tomato products, chili peppers, onions and garlic, and horseradish. Grains Pastries or quick breads with added fat. Meats and other proteins High-fat meats, such as fatty beef or pork, hot dogs, ribs, ham, sausage,  salami, and bacon. Fried meat or protein, including fried fish and fried chicken. Nuts and nut butters, in large amounts. Dairy Whole milk and chocolate milk. Sour cream. Cream. Ice cream. Cream cheese. Milkshakes. Fats and oils Butter. Margarine. Shortening. Ghee. Beverages Coffee and tea, with or without caffeine. Carbonated beverages. Sodas. Energy drinks. Fruit juice made with acidic fruits, such as orange or grapefruit. Tomato juice. Alcoholic drinks. Sweets and desserts Chocolate and cocoa. Donuts. Seasonings and condiments Pepper. Peppermint and spearmint. Added salt. Any condiments, herbs, or seasonings that cause symptoms. For some people, this may include curry, hot sauce, or vinegar-based salad dressings. The items listed above may not be a complete list of what you should not eat and drink. Contact a food expert for more options. Questions to ask your doctor Diet and lifestyle changes are often the first steps that are taken to manage symptoms of GERD. If diet and lifestyle changes do not help, talk with your doctor about taking medicines. Where to find more information International Foundation for Gastrointestinal Disorders: aboutgerd.org Summary When you have GERD, food and lifestyle choices are very important in easing your symptoms. Eat small meals often instead of 3 large meals a day. Eat your meals slowly and in a place where you are relaxed. Avoid bending over or lying down until 2-3 hours after eating. Limit high-fat foods such as fatty meats or fried foods. This information is not intended to replace advice given to you by your health care provider. Make sure you discuss any questions you have with your health care provider. Document Revised: 08/13/2019 Document Reviewed: 08/13/2019 Elsevier Patient Education  [place].

## 2020-10-30 NOTE — Patient Instructions (Addendum)
DUE TO COVID-19 ONLY ONE VISITOR IS ALLOWED TO COME WITH YOU AND STAY IN THE WAITING ROOM ONLY DURING PRE OP AND PROCEDURE.   **NO VISITORS ARE ALLOWED IN THE SHORT STAY AREA OR RECOVERY ROOM!!**       Your procedure is scheduled on: 11/13/20   Report to [facility] Main  Entrance    Report to admitting at 6:00 AM   Call this number if you have problems the morning of surgery [phone]   Do not eat food :After Midnight.   May have liquids until 5:40 AM day of surgery  CLEAR LIQUID DIET  Foods Allowed                                                                     Foods Excluded  Water, Black Coffee and tea (no milk or creamer)           liquids that you cannot  Plain Jell-O in any flavor  (No red)                                    see through such as: Fruit ices (not with fruit pulp)                                            milk, soups, orange juice              Iced Popsicles (No red)                                                All solid food                                   Apple juices Sports drinks like Gatorade (No red) Lightly seasoned clear broth or consume(fat free) Sugar      The day of surgery:  Drink ONE (1) Pre-Surgery Clear Ensure by 5:40 am the morning of surgery. Drink in one sitting. Do not sip.  This drink was given to you during your hospital  pre-op appointment visit. Nothing else to drink after completing the  Pre-Surgery Clear Ensure.          If you have questions, please contact your surgeon's office.     Oral Hygiene is also important to reduce your risk of infection.                                    Remember - BRUSH YOUR TEETH THE MORNING OF SURGERY WITH YOUR REGULAR TOOTHPASTE   Take these medicines the morning of surgery with A SIP OF WATER: Tylenol, Pantoprazole  You may not have any metal on your body including hair pins, jewelry, and body piercing             Do not wear make-up, lotions,  powders, perfumes, or deodorant  Do not wear nail polish including gel and S&S, artificial/acrylic nails, or any other type of covering on natural nails including finger and toenails. If you have artificial nails, gel coating, etc. that needs to be removed by a nail salon please have this removed prior to surgery or surgery may need to be canceled/ delayed if the surgeon/ anesthesia feels like they are unable to be safely monitored.   Do not shave  48 hours prior to surgery.    Do not bring valuables to the hospital. [place].   Patients discharged the day of surgery will not be allowed to drive home.  Special Instructions: Bring a copy of your healthcare power of attorney and living will documents         the day of surgery if you haven't scanned them in before.  Please read over the following fact sheets you were given: IF YOU HAVE QUESTIONS ABOUT YOUR PRE OP INSTRUCTIONS PLEASE CALL [phone]- [place] - Preparing for Surgery Before surgery, you can play an important role.  Because skin is not sterile, your skin needs to be as free of germs as possible.  You can reduce the number of germs on your skin by washing with CHG (chlorahexidine gluconate) soap before surgery.  CHG is an antiseptic cleaner which kills germs and bonds with the skin to continue killing germs even after washing. Please DO NOT use if you have an allergy to CHG or antibacterial soaps.  If your skin becomes reddened/irritated stop using the CHG and inform your nurse when you arrive at Short Stay. Do not shave (including legs and underarms) for at least 48 hours prior to the first CHG shower.  You may shave your face/neck.  Please follow these instructions carefully:  1.  Shower with CHG Soap the night before surgery and the  morning of surgery.  2.  If you choose to wash your hair, wash your hair first as usual with your normal  shampoo.  3.  After you  shampoo, rinse your hair and body thoroughly to remove the shampoo.                             4.  Use CHG as you would any other liquid soap.  You can apply chg directly to the skin and wash.  Gently with a scrungie or clean washcloth.  5.  Apply the CHG Soap to your body ONLY FROM THE NECK DOWN.   Do   not use on face/ open                           Wound or open sores. Avoid contact with eyes, ears mouth and   genitals (private parts).                       Wash face,  Genitals (private parts) with your normal soap.             6.  Wash thoroughly, paying special attention to the area where your  surgery  will be performed.  7.  Thoroughly rinse your body with warm water from the neck down.  8.  DO NOT shower/wash with your normal soap after using and rinsing off the CHG Soap.                9.  Pat yourself dry with a clean towel.            10.  Wear clean pajamas.            11.  Place clean sheets on your bed the night of your first shower and do not  sleep with pets. Day of Surgery : Do not apply any lotions/deodorants the morning of surgery.  Please wear clean clothes to the hospital/surgery center.  FAILURE TO FOLLOW THESE INSTRUCTIONS MAY RESULT IN THE CANCELLATION OF YOUR SURGERY  PATIENT SIGNATURE_________________________________  NURSE SIGNATURE__________________________________  ________________________________________________________________________   [name]  An incentive spirometer is a tool that can help keep your lungs clear and active. This tool measures how well you are filling your lungs with each breath. Taking long deep breaths may help reverse or decrease the chance of developing breathing (pulmonary) problems (especially infection) following: A long period of time when you are unable to move or be active. BEFORE THE PROCEDURE  If the spirometer includes an indicator to show your best effort, your nurse or respiratory therapist will set it to a  desired goal. If possible, sit up straight or lean slightly forward. Try not to slouch. Hold the incentive spirometer in an upright position. INSTRUCTIONS FOR USE  Sit on the edge of your bed if possible, or sit up as far as you can in bed or on a chair. Hold the incentive spirometer in an upright position. Breathe out normally. Place the mouthpiece in your mouth and seal your lips tightly around it. Breathe in slowly and as deeply as possible, raising the piston or the ball toward the top of the column. Hold your breath for 3-5 seconds or for as long as possible. Allow the piston or ball to fall to the bottom of the column. Remove the mouthpiece from your mouth and breathe out normally. Rest for a few seconds and repeat Steps 1 through 7 at least 10 times every 1-2 hours when you are awake. Take your time and take a few normal breaths between deep breaths. The spirometer may include an indicator to show your best effort. Use the indicator as a goal to work toward during each repetition. After each set of 10 deep breaths, practice coughing to be sure your lungs are clear. If you have an incision (the cut made at the time of surgery), support your incision when coughing by placing a pillow or rolled up towels firmly against it. Once you are able to get out of bed, walk around indoors and cough well. You may stop using the incentive spirometer when instructed by your caregiver.  RISKS AND COMPLICATIONS Take your time so you do not get dizzy or light-headed. If you are in pain, you may need to take or ask for pain medication before doing incentive spirometry. It is harder to take a deep breath if you are having pain. AFTER USE Rest and breathe slowly and easily. It can be helpful to keep track of a log of your progress. Your caregiver can provide you with a simple table to help with this. If you are using the spirometer at home, follow these instructions: [place] IF:  You are having  difficultly using the spirometer. You have trouble using the spirometer as often as instructed. Your pain medication is not giving enough relief while using the spirometer. You develop fever of 100.5 F (38.1 C) or higher. SEEK IMMEDIATE MEDICAL CARE IF:  You cough up bloody sputum that had not been present before. You develop fever of 102 F (38.9 C) or greater. You develop worsening pain at or near the incision site. MAKE SURE YOU:  Understand these instructions. Will watch your condition. Will get help right away if you are not doing well or get worse. Document Released: 06/14/2006 Document Revised: 04/26/2011 Document Reviewed: 08/15/2006 ExitCare Patient Information 2014 ExitCare, [place].   ________________________________________________________________________  WHAT IS A BLOOD TRANSFUSION? Blood Transfusion Information  A transfusion is the replacement of blood or some of its parts. Blood is made up of multiple cells which provide different functions. Red blood cells carry oxygen and are used for blood loss replacement. White blood cells fight against infection. Platelets control bleeding. Plasma helps clot blood. Other blood products are available for specialized needs, such as hemophilia or other clotting disorders. BEFORE THE TRANSFUSION  Who gives blood for transfusions?  Healthy volunteers who are fully evaluated to make sure their blood is safe. This is blood bank blood. Transfusion therapy is the safest it has ever been in the practice of medicine. Before blood is taken from a donor, a complete history is taken to make sure that person has no history of diseases nor engages in risky social behavior (examples are intravenous drug use or sexual activity with multiple partners). The donor's travel history is screened to minimize risk of transmitting infections, such as malaria. The donated blood is tested for signs of infectious diseases, such as HIV and hepatitis. The blood  is then tested to be sure it is compatible with you in order to minimize the chance of a transfusion reaction. If you or a relative donates blood, this is often done in anticipation of surgery and is not appropriate for emergency situations. It takes many days to process the donated blood. RISKS AND COMPLICATIONS Although transfusion therapy is very safe and saves many lives, the main dangers of transfusion include:  Getting an infectious disease. Developing a transfusion reaction. This is an allergic reaction to something in the blood you were given. Every precaution is taken to prevent this. The decision to have a blood transfusion has been considered carefully by your caregiver before blood is given. Blood is not given unless the benefits outweigh the risks. AFTER THE TRANSFUSION Right after receiving a blood transfusion, you will usually feel much better and more energetic. This is especially true if your red blood cells have gotten low (anemic). The transfusion raises the level of the red blood cells which carry oxygen, and this usually causes an energy increase. The nurse administering the transfusion will monitor you carefully for complications. HOME CARE INSTRUCTIONS  No special instructions are needed after a transfusion. You may find your energy is better. Speak with your caregiver about any limitations on activity for underlying diseases you may have. SEEK MEDICAL CARE IF:  Your condition is not improving after your transfusion. You develop redness or irritation at the intravenous (IV) site. SEEK IMMEDIATE MEDICAL CARE IF:  Any of the following symptoms occur over the next 12 hours: Shaking chills. You have a temperature by mouth above 102 F (38.9 C), not controlled by medicine. Chest, back, or muscle pain. People around you feel you are not  acting correctly or are confused. Shortness of breath or difficulty breathing. Dizziness and fainting. You get a rash or develop hives. You  have a decrease in urine output. Your urine turns a dark color or changes to pink, red, or brown. Any of the following symptoms occur over the next 10 days: You have a temperature by mouth above 102 F (38.9 C), not controlled by medicine. Shortness of breath. Weakness after normal activity. The white part of the eye turns yellow (jaundice). You have a decrease in the amount of urine or are urinating less often. Your urine turns a dark color or changes to pink, red, or brown. Document Released: 01/30/2000 Document Revised: 04/26/2011 Document Reviewed: 09/18/2007 [facility] Patient Information 2014 [place], [place].  _______________________________________________________________________

## 2020-10-30 NOTE — Progress Notes (Addendum)
COVID swab appointment: N/a  COVID Vaccine Completed: yes x2 Date COVID Vaccine completed: 06/02/19, 06/26/19 Has received booster: COVID vaccine manufacturer: Pfizer       Date of COVID positive in last 90 days: No  PCP - [name], MD Cardiologist - N/a  Chest x-ray - N/a EKG - 09/02/20 Epic Stress Test - years ago per pt ECHO - years ago per pt Cardiac Cath - N/a Pacemaker/ICD device last checked: N/a Spinal Cord Stimulator: N/a  Sleep Study - N/a CPAP -   Fasting Blood Sugar - N/a Checks Blood Sugar _____ times a day  Blood Thinner Instructions: N/a Aspirin Instructions: Last Dose:  Activity level: Can go up a flight of stairs and perform activities of daily living without stopping and without symptoms of chest pain or shortness of breath.     Anesthesia review:  Patient denies shortness of breath, fever, cough and chest pain at PAT appointment   Patient verbalized understanding of instructions that were given to them at the PAT appointment. Patient was also instructed that they will need to review over the PAT instructions again at home before surgery.

## 2020-10-31 ENCOUNTER — Encounter (HOSPITAL_COMMUNITY): Payer: Self-pay

## 2020-10-31 ENCOUNTER — Other Ambulatory Visit: Payer: Self-pay

## 2020-10-31 ENCOUNTER — Encounter (HOSPITAL_COMMUNITY)
Admission: RE | Admit: 2020-10-31 | Discharge: 2020-10-31 | Disposition: A | Discharge status: Home / Self Care | Payer: Medicare Other | Source: Ambulatory Visit | Attending: Orthopedic Surgery | Admitting: Orthopedic Surgery

## 2020-10-31 DIAGNOSIS — Z01812 Encounter for preprocedural laboratory examination: Secondary | ICD-10-CM | POA: Insufficient documentation

## 2020-10-31 LAB — COMPREHENSIVE METABOLIC PANEL
ALT: 13 U/L (ref 0–44)
AST: 26 U/L (ref 15–41)
Albumin: 4 g/dL (ref 3.5–5.0)
Alkaline Phosphatase: 59 U/L (ref 38–126)
Anion gap: 6 (ref 5–15)
BUN: 11 mg/dL (ref 8–23)
CO2: 26 mmol/L (ref 22–32)
Calcium: 9.8 mg/dL (ref 8.9–10.3)
Chloride: 113 mmol/L — ABNORMAL HIGH (ref 98–111)
Creatinine, Ser: 0.71 mg/dL (ref 0.44–1.00)
GFR, Estimated: >60 mL/min (ref >60)
Glucose, Bld: 89 mg/dL (ref 70–99)
Potassium: 4.4 mmol/L (ref 3.5–5.1)
Sodium: 145 mmol/L (ref 135–145)
Total Bilirubin: 0.5 mg/dL (ref 0.3–1.2)
Total Protein: 7.7 g/dL (ref 6.5–8.1)

## 2020-10-31 LAB — TYPE AND SCREEN
ABO/RH(D): B POS
Antibody Screen: NEGATIVE

## 2020-10-31 LAB — SURGICAL PCR SCREEN
MRSA, PCR: NEGATIVE
Staphylococcus aureus: NEGATIVE

## 2020-10-31 LAB — CBC
HCT: 37.3 % (ref 36.0–46.0)
Hemoglobin: 11.6 g/dL — ABNORMAL LOW (ref 12.0–15.0)
MCH: 22.6 pg — ABNORMAL LOW (ref 26.0–34.0)
MCHC: 31.1 g/dL (ref 30.0–36.0)
MCV: 72.7 fL — ABNORMAL LOW (ref 80.0–100.0)
Platelets: 301 10*3/uL (ref 150–400)
RBC: 5.13 MIL/uL — ABNORMAL HIGH (ref 3.87–5.11)
RDW: 14.8 % (ref 11.5–15.5)
WBC: 5.5 10*3/uL (ref 4.0–10.5)
nRBC: 0 % (ref 0.0–0.2)

## 2020-11-03 DIAGNOSIS — H2513 Age-related nuclear cataract, bilateral: Secondary | ICD-10-CM | POA: Diagnosis not present

## 2020-11-13 ENCOUNTER — Ambulatory Visit (HOSPITAL_COMMUNITY): Payer: Medicare Other | Admitting: Certified Registered Nurse Anesthetist

## 2020-11-13 ENCOUNTER — Encounter (HOSPITAL_COMMUNITY): Payer: Self-pay | Admitting: Orthopedic Surgery

## 2020-11-13 ENCOUNTER — Observation Stay (HOSPITAL_COMMUNITY)
Admission: RE | Admit: 2020-11-13 | Discharge: 2020-11-14 | Disposition: A | Discharge status: Home / Self Care | Payer: Medicare Other | Attending: Orthopedic Surgery | Admitting: Orthopedic Surgery

## 2020-11-13 ENCOUNTER — Encounter (HOSPITAL_COMMUNITY)
Admission: RE | Disposition: A | Discharge status: Home / Self Care | Payer: Self-pay | Source: Home / Self Care | Attending: Orthopedic Surgery

## 2020-11-13 ENCOUNTER — Other Ambulatory Visit: Payer: Self-pay

## 2020-11-13 DIAGNOSIS — J45909 Unspecified asthma, uncomplicated: Secondary | ICD-10-CM | POA: Diagnosis not present

## 2020-11-13 DIAGNOSIS — M25761 Osteophyte, right knee: Secondary | ICD-10-CM | POA: Diagnosis not present

## 2020-11-13 DIAGNOSIS — Z96652 Presence of left artificial knee joint: Secondary | ICD-10-CM | POA: Diagnosis not present

## 2020-11-13 DIAGNOSIS — G8918 Other acute postprocedural pain: Secondary | ICD-10-CM | POA: Diagnosis not present

## 2020-11-13 DIAGNOSIS — Z8616 Personal history of COVID-19: Secondary | ICD-10-CM | POA: Diagnosis not present

## 2020-11-13 DIAGNOSIS — M25461 Effusion, right knee: Secondary | ICD-10-CM | POA: Diagnosis not present

## 2020-11-13 DIAGNOSIS — Z96651 Presence of right artificial knee joint: Secondary | ICD-10-CM

## 2020-11-13 DIAGNOSIS — M1711 Unilateral primary osteoarthritis, right knee: Principal | ICD-10-CM | POA: Insufficient documentation

## 2020-11-13 HISTORY — PX: TOTAL KNEE ARTHROPLASTY: SHX125

## 2020-11-13 SURGERY — ARTHROPLASTY, KNEE, TOTAL
Anesthesia: General | Site: Knee | Laterality: Right

## 2020-11-13 MED ORDER — OXYCODONE HCL 5 MG PO TABS: 5.0000 mg | ORAL_TABLET | ORAL | 0 refills | Status: DC | PRN

## 2020-11-13 MED ORDER — LACTATED RINGERS IV BOLUS
500.0000 mL | Freq: Once | INTRAVENOUS | Status: AC
  Administered 2020-11-13: 500 mL via INTRAVENOUS

## 2020-11-13 MED ORDER — DIPHENHYDRAMINE HCL 12.5 MG/5ML PO ELIX: 12.5000 mg | ORAL_SOLUTION | ORAL | Status: DC | PRN

## 2020-11-13 MED ORDER — OXYCODONE HCL 5 MG PO TABS: 5.0000 mg | ORAL_TABLET | Freq: Once | ORAL | Status: DC | PRN

## 2020-11-13 MED ORDER — METHOCARBAMOL 500 MG PO TABS: 500.0000 mg | ORAL_TABLET | Freq: Four times a day (QID) | ORAL | 0 refills | Status: DC | PRN

## 2020-11-13 MED ORDER — PROPOFOL 10 MG/ML IV BOLUS
INTRAVENOUS | Status: AC
  Filled 2020-11-13: qty 20

## 2020-11-13 MED ORDER — ONDANSETRON HCL 4 MG/2ML IJ SOLN
INTRAMUSCULAR | Status: AC
  Filled 2020-11-13: qty 2

## 2020-11-13 MED ORDER — POVIDONE-IODINE 10 % EX SWAB
2.0000 "application " | Freq: Once | CUTANEOUS | Status: AC
  Administered 2020-11-13: 2 via TOPICAL

## 2020-11-13 MED ORDER — BUPIVACAINE-EPINEPHRINE (PF) 0.25% -1:200000 IJ SOLN
INTRAMUSCULAR | Status: AC
  Filled 2020-11-13: qty 30

## 2020-11-13 MED ORDER — PHENOL 1.4 % MT LIQD: 1.0000 | OROMUCOSAL | Status: DC | PRN

## 2020-11-13 MED ORDER — KETOROLAC TROMETHAMINE 15 MG/ML IJ SOLN
15.0000 mg | Freq: Four times a day (QID) | INTRAMUSCULAR | Status: AC
  Administered 2020-11-13 – 2020-11-14 (×2): 15 mg via INTRAVENOUS
  Filled 2020-11-13 (×2): qty 1

## 2020-11-13 MED ORDER — KETOROLAC TROMETHAMINE 30 MG/ML IJ SOLN
INTRAMUSCULAR | Status: DC | PRN
  Administered 2020-11-13: 30 mg

## 2020-11-13 MED ORDER — HYDROMORPHONE HCL 1 MG/ML IJ SOLN
0.5000 mg | INTRAMUSCULAR | Status: DC | PRN
  Administered 2020-11-13 (×2): 1 mg via INTRAVENOUS
  Filled 2020-11-13 (×2): qty 1

## 2020-11-13 MED ORDER — SODIUM CHLORIDE 0.9 % IV SOLN: INTRAVENOUS | Status: DC

## 2020-11-13 MED ORDER — ACETAMINOPHEN 10 MG/ML IV SOLN
INTRAVENOUS | Status: AC
  Administered 2020-11-13: 1000 mg via INTRAVENOUS
  Filled 2020-11-13: qty 100

## 2020-11-13 MED ORDER — HYDROMORPHONE HCL 1 MG/ML IJ SOLN
INTRAMUSCULAR | Status: AC
  Filled 2020-11-13: qty 1

## 2020-11-13 MED ORDER — SODIUM CHLORIDE 0.9 % IR SOLN
Status: DC | PRN
  Administered 2020-11-13: 1000 mL

## 2020-11-13 MED ORDER — PHENYLEPHRINE HCL-NACL 20-0.9 MG/250ML-% IV SOLN
INTRAVENOUS | Status: DC | PRN
  Administered 2020-11-13: 35 ug/min via INTRAVENOUS

## 2020-11-13 MED ORDER — PHENYLEPHRINE 40 MCG/ML (10ML) SYRINGE FOR IV PUSH (FOR BLOOD PRESSURE SUPPORT)
PREFILLED_SYRINGE | INTRAVENOUS | Status: DC | PRN
  Administered 2020-11-13 (×2): 80 ug via INTRAVENOUS

## 2020-11-13 MED ORDER — STERILE WATER FOR IRRIGATION IR SOLN
Status: DC | PRN
  Administered 2020-11-13: 2000 mL

## 2020-11-13 MED ORDER — METHOCARBAMOL 500 MG PO TABS
500.0000 mg | ORAL_TABLET | Freq: Four times a day (QID) | ORAL | Status: DC | PRN
  Administered 2020-11-13 – 2020-11-14 (×2): 500 mg via ORAL
  Filled 2020-11-13 (×2): qty 1

## 2020-11-13 MED ORDER — METHOCARBAMOL 500 MG IVPB - SIMPLE MED
500.0000 mg | Freq: Four times a day (QID) | INTRAVENOUS | Status: DC | PRN
  Filled 2020-11-13: qty 50

## 2020-11-13 MED ORDER — LIDOCAINE 2% (20 MG/ML) 5 ML SYRINGE
INTRAMUSCULAR | Status: DC | PRN
  Administered 2020-11-13: 100 mg via INTRAVENOUS

## 2020-11-13 MED ORDER — SUMATRIPTAN SUCCINATE 50 MG PO TABS
100.0000 mg | ORAL_TABLET | ORAL | Status: DC | PRN
  Filled 2020-11-13: qty 2

## 2020-11-13 MED ORDER — LIDOCAINE HCL (PF) 2 % IJ SOLN
INTRAMUSCULAR | Status: AC
  Filled 2020-11-13: qty 5

## 2020-11-13 MED ORDER — KETOROLAC TROMETHAMINE 30 MG/ML IJ SOLN
INTRAMUSCULAR | Status: AC
  Filled 2020-11-13: qty 1

## 2020-11-13 MED ORDER — SODIUM CHLORIDE (PF) 0.9 % IJ SOLN
INTRAMUSCULAR | Status: DC | PRN
  Administered 2020-11-13: 30 mL

## 2020-11-13 MED ORDER — LACTATED RINGERS IV BOLUS
250.0000 mL | Freq: Once | INTRAVENOUS | Status: AC
  Administered 2020-11-13: 250 mL via INTRAVENOUS

## 2020-11-13 MED ORDER — KETAMINE HCL 10 MG/ML IJ SOLN
INTRAMUSCULAR | Status: DC | PRN
  Administered 2020-11-13: 50 mg via INTRAVENOUS

## 2020-11-13 MED ORDER — DOCUSATE SODIUM 100 MG PO CAPS: 100.0000 mg | ORAL_CAPSULE | Freq: Two times a day (BID) | ORAL | Status: DC

## 2020-11-13 MED ORDER — OXYCODONE HCL 5 MG/5ML PO SOLN: 5.0000 mg | Freq: Once | ORAL | Status: DC | PRN

## 2020-11-13 MED ORDER — ONDANSETRON HCL 4 MG PO TABS: 4.0000 mg | ORAL_TABLET | Freq: Four times a day (QID) | ORAL | Status: DC | PRN

## 2020-11-13 MED ORDER — LACTATED RINGERS IV SOLN: INTRAVENOUS | Status: DC

## 2020-11-13 MED ORDER — METHOCARBAMOL 500 MG PO TABS
500.0000 mg | ORAL_TABLET | Freq: Four times a day (QID) | ORAL | 0 refills | Status: DC | PRN
Start: 2020-11-13 — End: 2020-11-13

## 2020-11-13 MED ORDER — HYDROMORPHONE HCL 1 MG/ML IJ SOLN
INTRAMUSCULAR | Status: DC | PRN
  Administered 2020-11-13 (×2): .6 mg via INTRAVENOUS
  Administered 2020-11-13 (×2): .4 mg via INTRAVENOUS

## 2020-11-13 MED ORDER — BUPIVACAINE-EPINEPHRINE (PF) 0.25% -1:200000 IJ SOLN
INTRAMUSCULAR | Status: DC | PRN
  Administered 2020-11-13: 30 mL

## 2020-11-13 MED ORDER — HYDROMORPHONE HCL 2 MG/ML IJ SOLN
INTRAMUSCULAR | Status: AC
  Filled 2020-11-13: qty 1

## 2020-11-13 MED ORDER — MIDAZOLAM HCL 2 MG/2ML IJ SOLN
1.0000 mg | INTRAMUSCULAR | Status: DC
  Administered 2020-11-13: 2 mg via INTRAVENOUS
  Filled 2020-11-13: qty 2

## 2020-11-13 MED ORDER — FERROUS SULFATE 325 (65 FE) MG PO TABS
325.0000 mg | ORAL_TABLET | Freq: Three times a day (TID) | ORAL | Status: DC
  Administered 2020-11-13 – 2020-11-14 (×2): 325 mg via ORAL
  Filled 2020-11-13 (×2): qty 1

## 2020-11-13 MED ORDER — METOCLOPRAMIDE HCL 5 MG/ML IJ SOLN: 5.0000 mg | Freq: Three times a day (TID) | INTRAMUSCULAR | Status: DC | PRN

## 2020-11-13 MED ORDER — CHLORHEXIDINE GLUCONATE 0.12 % MT SOLN
15.0000 mL | Freq: Once | OROMUCOSAL | Status: AC
  Administered 2020-11-13: 15 mL via OROMUCOSAL

## 2020-11-13 MED ORDER — FENTANYL CITRATE (PF) 100 MCG/2ML IJ SOLN
INTRAMUSCULAR | Status: AC
  Filled 2020-11-13: qty 2

## 2020-11-13 MED ORDER — KETAMINE HCL 10 MG/ML IJ SOLN
INTRAMUSCULAR | Status: AC
  Filled 2020-11-13: qty 1

## 2020-11-13 MED ORDER — ACETAMINOPHEN 10 MG/ML IV SOLN: 1000.0000 mg | Freq: Once | INTRAVENOUS | Status: DC | PRN

## 2020-11-13 MED ORDER — ONDANSETRON HCL 4 MG/2ML IJ SOLN: 4.0000 mg | Freq: Four times a day (QID) | INTRAMUSCULAR | Status: DC | PRN

## 2020-11-13 MED ORDER — 0.9 % SODIUM CHLORIDE (POUR BTL) OPTIME
TOPICAL | Status: DC | PRN
  Administered 2020-11-13: 1000 mL

## 2020-11-13 MED ORDER — RIVAROXABAN 10 MG PO TABS: 10.0000 mg | ORAL_TABLET | Freq: Every day | ORAL | 0 refills | Status: DC

## 2020-11-13 MED ORDER — METHOCARBAMOL 500 MG IVPB - SIMPLE MED
INTRAVENOUS | Status: AC
  Administered 2020-11-13: 500 mg via INTRAVENOUS
  Filled 2020-11-13: qty 50

## 2020-11-13 MED ORDER — BUPIVACAINE HCL (PF) 0.5 % IJ SOLN
INTRAMUSCULAR | Status: DC | PRN
  Administered 2020-11-13: 20 mL via PERINEURAL

## 2020-11-13 MED ORDER — HYDROMORPHONE HCL 1 MG/ML IJ SOLN
0.2500 mg | INTRAMUSCULAR | Status: DC | PRN
  Administered 2020-11-13: 0.5 mg via INTRAVENOUS

## 2020-11-13 MED ORDER — LACTATED RINGERS IV BOLUS: 250.0000 mL | Freq: Once | INTRAVENOUS | Status: DC

## 2020-11-13 MED ORDER — ORAL CARE MOUTH RINSE: 15.0000 mL | Freq: Once | OROMUCOSAL | Status: AC

## 2020-11-13 MED ORDER — DEXAMETHASONE SODIUM PHOSPHATE 10 MG/ML IJ SOLN
10.0000 mg | Freq: Once | INTRAMUSCULAR | Status: AC
  Administered 2020-11-14: 10 mg via INTRAVENOUS
  Filled 2020-11-13: qty 1

## 2020-11-13 MED ORDER — POLYETHYLENE GLYCOL 3350 17 G PO PACK: 17.0000 g | PACK | Freq: Two times a day (BID) | ORAL | 0 refills | Status: DC

## 2020-11-13 MED ORDER — MIDAZOLAM HCL 2 MG/2ML IJ SOLN
INTRAMUSCULAR | Status: AC
  Filled 2020-11-13: qty 2

## 2020-11-13 MED ORDER — SODIUM CHLORIDE (PF) 0.9 % IJ SOLN
INTRAMUSCULAR | Status: AC
  Filled 2020-11-13: qty 10

## 2020-11-13 MED ORDER — CEFAZOLIN SODIUM-DEXTROSE 2-4 GM/100ML-% IV SOLN
2.0000 g | INTRAVENOUS | Status: AC
  Administered 2020-11-13: 2 g via INTRAVENOUS
  Filled 2020-11-13: qty 100

## 2020-11-13 MED ORDER — OXYCODONE HCL 5 MG PO TABS
10.0000 mg | ORAL_TABLET | ORAL | Status: DC | PRN
  Administered 2020-11-13 – 2020-11-14 (×3): 10 mg via ORAL
  Filled 2020-11-13 (×2): qty 2

## 2020-11-13 MED ORDER — BISACODYL 10 MG RE SUPP: 10.0000 mg | Freq: Every day | RECTAL | Status: DC | PRN

## 2020-11-13 MED ORDER — FENTANYL CITRATE PF 50 MCG/ML IJ SOSY
50.0000 ug | PREFILLED_SYRINGE | INTRAMUSCULAR | Status: DC
Start: 2020-11-13 — End: 2020-11-13
  Administered 2020-11-13: 50 ug via INTRAVENOUS
  Filled 2020-11-13: qty 2

## 2020-11-13 MED ORDER — ONDANSETRON HCL 4 MG/2ML IJ SOLN
INTRAMUSCULAR | Status: DC | PRN
  Administered 2020-11-13: 4 mg via INTRAVENOUS

## 2020-11-13 MED ORDER — TRANEXAMIC ACID-NACL 1000-0.7 MG/100ML-% IV SOLN
1000.0000 mg | Freq: Once | INTRAVENOUS | Status: AC
  Administered 2020-11-13: 1000 mg via INTRAVENOUS
  Filled 2020-11-13: qty 100

## 2020-11-13 MED ORDER — RIVAROXABAN 10 MG PO TABS
10.0000 mg | ORAL_TABLET | Freq: Every day | ORAL | Status: DC
  Administered 2020-11-14: 10 mg via ORAL
  Filled 2020-11-13: qty 1

## 2020-11-13 MED ORDER — OXYCODONE HCL 5 MG PO TABS
5.0000 mg | ORAL_TABLET | ORAL | Status: DC | PRN
  Administered 2020-11-14: 10 mg via ORAL
  Filled 2020-11-13 (×2): qty 2

## 2020-11-13 MED ORDER — SULFASALAZINE 500 MG PO TABS
1000.0000 mg | ORAL_TABLET | Freq: Two times a day (BID) | ORAL | Status: DC
  Administered 2020-11-13 – 2020-11-14 (×2): 1000 mg via ORAL
  Filled 2020-11-13 (×2): qty 2

## 2020-11-13 MED ORDER — PROPOFOL 10 MG/ML IV BOLUS
INTRAVENOUS | Status: DC | PRN
  Administered 2020-11-13: 200 mg via INTRAVENOUS

## 2020-11-13 MED ORDER — DOCUSATE SODIUM 100 MG PO CAPS
100.0000 mg | ORAL_CAPSULE | Freq: Two times a day (BID) | ORAL | Status: DC
  Administered 2020-11-13 – 2020-11-14 (×2): 100 mg via ORAL
  Filled 2020-11-13 (×2): qty 1

## 2020-11-13 MED ORDER — CEFAZOLIN SODIUM-DEXTROSE 2-4 GM/100ML-% IV SOLN
2.0000 g | Freq: Four times a day (QID) | INTRAVENOUS | Status: AC
  Administered 2020-11-13 (×2): 2 g via INTRAVENOUS
  Filled 2020-11-13 (×2): qty 100

## 2020-11-13 MED ORDER — METOCLOPRAMIDE HCL 5 MG PO TABS: 5.0000 mg | ORAL_TABLET | Freq: Three times a day (TID) | ORAL | Status: DC | PRN

## 2020-11-13 MED ORDER — PHENYLEPHRINE 40 MCG/ML (10ML) SYRINGE FOR IV PUSH (FOR BLOOD PRESSURE SUPPORT)
PREFILLED_SYRINGE | INTRAVENOUS | Status: AC
  Filled 2020-11-13: qty 10

## 2020-11-13 MED ORDER — MENTHOL 3 MG MT LOZG: 1.0000 | LOZENGE | OROMUCOSAL | Status: DC | PRN

## 2020-11-13 MED ORDER — TRANEXAMIC ACID-NACL 1000-0.7 MG/100ML-% IV SOLN
1000.0000 mg | INTRAVENOUS | Status: AC
  Administered 2020-11-13: 1000 mg via INTRAVENOUS
  Filled 2020-11-13: qty 100

## 2020-11-13 MED ORDER — ONDANSETRON HCL 4 MG/2ML IJ SOLN: 4.0000 mg | Freq: Once | INTRAMUSCULAR | Status: DC | PRN

## 2020-11-13 MED ORDER — FENTANYL CITRATE (PF) 100 MCG/2ML IJ SOLN
INTRAMUSCULAR | Status: DC | PRN
  Administered 2020-11-13 (×2): 50 ug via INTRAVENOUS

## 2020-11-13 MED ORDER — DEXAMETHASONE SODIUM PHOSPHATE 10 MG/ML IJ SOLN
8.0000 mg | Freq: Once | INTRAMUSCULAR | Status: AC
  Administered 2020-11-13: 10 mg via INTRAVENOUS

## 2020-11-13 MED ORDER — POLYETHYLENE GLYCOL 3350 17 G PO PACK: 17.0000 g | PACK | Freq: Every day | ORAL | Status: DC | PRN

## 2020-11-13 MED ORDER — ACETAMINOPHEN 325 MG PO TABS: 325.0000 mg | ORAL_TABLET | Freq: Four times a day (QID) | ORAL | Status: DC | PRN

## 2020-11-13 SURGICAL SUPPLY — 50 items
ATTUNE MED ANAT PAT 32 KNEE (Knees) ×2 IMPLANT
ATTUNE PSFEM RTSZ4 NARCEM KNEE (Femur) ×2 IMPLANT
ATTUNE PSRP INSR SZ4 7 KNEE (Insert) ×2 IMPLANT
BAG COUNTER SPONGE SURGICOUNT (BAG) IMPLANT
BAG ZIPLOCK 12X15 (MISCELLANEOUS) IMPLANT
BASE TIBIAL ROT PLAT SZ 3 KNEE (Knees) ×1 IMPLANT
BLADE SAW SGTL 11.0X1.19X90.0M (BLADE) ×2 IMPLANT
BLADE SAW SGTL 13.0X1.19X90.0M (BLADE) ×2 IMPLANT
BLADE SURG SZ10 CARB STEEL (BLADE) ×4 IMPLANT
BNDG ELASTIC 6X5.8 VLCR STR LF (GAUZE/BANDAGES/DRESSINGS) ×2 IMPLANT
BOWL SMART MIX CTS (DISPOSABLE) ×2 IMPLANT
CEMENT HV SMART SET (Cement) ×4 IMPLANT
CUFF TOURN SGL QUICK 34 (TOURNIQUET CUFF) ×1
CUFF TRNQT CYL 34X4.125X (TOURNIQUET CUFF) ×1 IMPLANT
DECANTER SPIKE VIAL GLASS SM (MISCELLANEOUS) ×4 IMPLANT
DERMABOND ADVANCED (GAUZE/BANDAGES/DRESSINGS) ×1
DERMABOND ADVANCED .7 DNX12 (GAUZE/BANDAGES/DRESSINGS) ×1 IMPLANT
DRAPE INCISE IOBAN 66X45 STRL (DRAPES) ×6 IMPLANT
DRAPE U-SHAPE 47X51 STRL (DRAPES) ×2 IMPLANT
DRESSING AQUACEL AG SP 3.5X10 (GAUZE/BANDAGES/DRESSINGS) ×1 IMPLANT
DRSG AQUACEL AG SP 3.5X10 (GAUZE/BANDAGES/DRESSINGS) ×2
DURAPREP 26ML APPLICATOR (WOUND CARE) ×4 IMPLANT
ELECT REM PT RETURN 15FT ADLT (MISCELLANEOUS) ×2 IMPLANT
GLOVE SURG ENC MOIS LTX SZ6 (GLOVE) ×2 IMPLANT
GLOVE SURG ENC MOIS LTX SZ7 (GLOVE) ×2 IMPLANT
GLOVE SURG POLY ORTHO LF SZ6.5 (GLOVE) ×2 IMPLANT
GLOVE SURG UNDER POLY LF SZ7.5 (GLOVE) ×2 IMPLANT
GOWN STRL REUS W/TWL LRG LVL3 (GOWN DISPOSABLE) ×2 IMPLANT
HANDPIECE INTERPULSE COAX TIP (DISPOSABLE) ×1
HOLDER FOLEY CATH W/STRAP (MISCELLANEOUS) IMPLANT
KIT TURNOVER KIT A (KITS) ×2 IMPLANT
MANIFOLD NEPTUNE II (INSTRUMENTS) ×2 IMPLANT
NDL SAFETY ECLIPSE 18X1.5 (NEEDLE) IMPLANT
NEEDLE HYPO 18GX1.5 SHARP (NEEDLE)
NS IRRIG 1000ML POUR BTL (IV SOLUTION) ×2 IMPLANT
PACK TOTAL KNEE CUSTOM (KITS) ×2 IMPLANT
PROTECTOR NERVE ULNAR (MISCELLANEOUS) ×2 IMPLANT
SET HNDPC FAN SPRY TIP SCT (DISPOSABLE) ×1 IMPLANT
SET PAD KNEE POSITIONER (MISCELLANEOUS) ×2 IMPLANT
SUT MNCRL AB 4-0 PS2 18 (SUTURE) ×2 IMPLANT
SUT STRATAFIX PDS+ 0 24IN (SUTURE) ×2 IMPLANT
SUT VIC AB 1 CT1 36 (SUTURE) ×2 IMPLANT
SUT VIC AB 2-0 CT1 27 (SUTURE) ×3
SUT VIC AB 2-0 CT1 TAPERPNT 27 (SUTURE) ×3 IMPLANT
SYR 3ML LL SCALE MARK (SYRINGE) ×2 IMPLANT
TIBIAL BASE ROT PLAT SZ 3 KNEE (Knees) ×2 IMPLANT
TRAY FOLEY MTR SLVR 16FR STAT (SET/KITS/TRAYS/PACK) IMPLANT
TUBE SUCTION HIGH CAP CLEAR NV (SUCTIONS) ×2 IMPLANT
WATER STERILE IRR 1000ML POUR (IV SOLUTION) ×4 IMPLANT
WRAP KNEE MAXI GEL POST OP (GAUZE/BANDAGES/DRESSINGS) ×2 IMPLANT

## 2020-11-13 NOTE — Care Plan (Signed)
Ortho Bundle Case Management Note  Patient Details  Name: [name] MRN: [id number] Date of Birth: [birth year]  R TKA on 11-13-20 DCP:  Home with dtrs.  2nd floor condo with 15-20 ste. DME:  No needs.  Has a RW and 3-in-1. PT:  [facility].  PT eval scheduled on 11-17-20 at 9:15 am.     DME Arranged:  N/A DME Agency:  NA  HH Arranged:  NA HH Agency:  NA  Additional Comments: Please contact me with any questions of if this plan should need to change.  Marianne Sofia, RN,CCM [facility]  [phone] 11/13/2020, 5:36 PM  -

## 2020-11-13 NOTE — Anesthesia Preprocedure Evaluation (Signed)
Anesthesia Evaluation  Patient identified by MRN, date of birth, ID band Patient awake    Reviewed: Allergy & Precautions, NPO status , Patient's Chart, lab work & pertinent test results  Airway Mallampati: II  TM Distance: >3 FB Neck ROM: Full    Dental no notable dental hx.    Pulmonary neg pulmonary ROS,    Pulmonary exam normal breath sounds clear to auscultation       Cardiovascular negative cardio ROS Normal cardiovascular exam Rhythm:Regular Rate:Normal     Neuro/Psych  Headaches, negative psych ROS   GI/Hepatic Neg liver ROS, GERD  Medicated,  Endo/Other  negative endocrine ROS  Renal/GU negative Renal ROS  negative genitourinary   Musculoskeletal  (+) Arthritis , Osteoarthritis,    Abdominal   Peds negative pediatric ROS (+)  Hematology  (+) anemia ,   Anesthesia Other Findings   Reproductive/Obstetrics negative OB ROS                             Anesthesia Physical Anesthesia Plan  ASA: 2  Anesthesia Plan: General   Post-op Pain Management:  Regional for Post-op pain   Induction: Intravenous  PONV Risk Score and Plan: 3 and Ondansetron, Dexamethasone and Treatment may vary due to age or medical condition  Airway Management Planned: LMA  Additional Equipment:   Intra-op Plan:   Post-operative Plan: Extubation in OR  Informed Consent: I have reviewed the patients History and Physical, chart, labs and discussed the procedure including the risks, benefits and alternatives for the proposed anesthesia with the patient or authorized representative who has indicated his/her understanding and acceptance.     Dental advisory given  Plan Discussed with: CRNA and Surgeon  Anesthesia Plan Comments:         Anesthesia Quick Evaluation

## 2020-11-13 NOTE — Interval H&P Note (Signed)
History and Physical Interval Note:  11/13/2020 7:18 AM  [name]  has presented today for surgery, with the diagnosis of Right knee osteoarthritis.  The various methods of treatment have been discussed with the patient and family. After consideration of risks, benefits and other options for treatment, the patient has consented to  Procedure(s): TOTAL KNEE ARTHROPLASTY (Right) as a surgical intervention.  The patient's history has been reviewed, patient examined, no change in status, stable for surgery.  I have reviewed the patient's chart and labs.  Questions were answered to the patient's satisfaction.     Mauri Pole

## 2020-11-13 NOTE — Evaluation (Signed)
Physical Therapy Evaluation Patient Details Name: [name] MRN: [id number] DOB: 26-Nov-1955 Today's Date: 11/13/2020  History of Present Illness  Patient is 65 y.o. female s/p Rt TKA on 11/13/20 with [place] significant for OA, COVID-19, GERD, scoliosis, Thalassemia, lumbar surgery, Lt TKA in 2016.    Clinical Impression  [name] is a 65 y.o. female POD 0 s/p Rt TKA. Patient reports independence with RW or SPC mobility at baseline. Patient is now limited by functional impairments (see PT problem list below) and requires min assist for transfers and gait with RW. Patient was able to ambulate ~12 feet with RW and min assist. Patient instructed in exercise to facilitate circulation to manage edema and reduce risk of DVT. Patient will benefit from continued skilled PT interventions to address impairments and progress towards PLOF. Acute PT will follow to progress mobility and stair training in preparation for safe discharge home.        Recommendations for follow up therapy are one component of a multi-disciplinary discharge planning process, led by the attending physician.  Recommendations may be updated based on patient status, additional functional criteria and insurance authorization.  Follow Up Recommendations Follow surgeon's recommendation for DC plan and follow-up therapies;Home health PT (pt has 2 flights of stairs to enter/exit home)    Equipment Recommendations  None recommended by PT    Recommendations for Other Services       Precautions / Restrictions Precautions Precautions: Fall Restrictions Weight Bearing Restrictions: No Other Position/Activity Restrictions: WBAT      Mobility  Bed Mobility Overal bed mobility: Needs Assistance Bed Mobility: Supine to Sit     Supine to sit: Min assist;HOB elevated     General bed mobility comments: cues for use of bed rail and assist to bring Rt LE off EOB and raise trunk.    Transfers Overall transfer level: Needs  assistance Equipment used: Rolling walker (2 wheeled) Transfers: Sit to/from [facility] Sit to Stand: Min assist Stand pivot transfers: Min assist       General transfer comment: cues for hand placement and technique with RW to rise from EOB and BSC. cues for stand step transfer to sequence steps to move from commode to recliner. Chair moved close due to dizziness.  Ambulation/Gait Ambulation/Gait assistance: Min assist Gait Distance (Feet): 12 Feet Assistive device: Rolling walker (2 wheeled) Gait Pattern/deviations: Step-to pattern;Decreased stride length;Decreased weight shift to right Gait velocity: decr   General Gait Details: cues for step to pattern and no overt LOB noted or buckling at Rt knee. assist required for walker management to turn in bathroom.  Stairs            Wheelchair Mobility    Modified Rankin (Stroke Patients Only)       Balance Overall balance assessment: Needs assistance Sitting-balance support: Feet supported Sitting balance-Leahy Scale: Good     Standing balance support: During functional activity;Bilateral upper extremity supported Standing balance-Leahy Scale: Poor                               Pertinent Vitals/Pain Pain Assessment: 0-10 Pain Score: 7  Pain Location: Rt knee Pain Descriptors / Indicators: Aching;Discomfort Pain Intervention(s): Limited activity within patient's tolerance;Monitored during session;Premedicated before session;Repositioned;Ice applied    Home Living Family/patient expects to be discharged to:: Private residence Living Arrangements: Alone Available Help at Discharge: Family Type of Home: Apartment (condo) Home Access: Stairs to enter Entrance Stairs-Rails: Can reach  both Entrance Stairs-Number of Steps: 2 flight Home Layout: One level Home Equipment: [place] - 2 wheels;Cane - single point;Bedside commode;Grab bars - tub/shower      Prior Function Level of  Independence: Independent with assistive device(s)         Comments: uses RW or cane depending on pain     Hand Dominance   Dominant Hand: Right    Extremity/Trunk Assessment   Upper Extremity Assessment Upper Extremity Assessment: Overall WFL for tasks assessed    Lower Extremity Assessment Lower Extremity Assessment: RLE deficits/detail RLE Deficits / Details: good quad activation, no extensor lag with SLR RLE Sensation: WNL RLE Coordination: WNL    Cervical / Trunk Assessment Cervical / Trunk Assessment: Normal  Communication   Communication: No difficulties  Cognition Arousal/Alertness: Awake/alert Behavior During Therapy: WFL for tasks assessed/performed Overall Cognitive Status: Within Functional Limits for tasks assessed                                        General Comments      Exercises Total Joint Exercises Ankle Circles/Pumps: AROM;Both;20 reps;Seated   Assessment/Plan    PT Assessment Patient needs continued PT services  PT Problem List Decreased strength;Decreased range of motion;Decreased activity tolerance;Decreased balance;Decreased mobility;Decreased knowledge of use of DME;Decreased knowledge of precautions;Pain       PT Treatment Interventions DME instruction;Gait training;Stair training;Functional mobility training;Therapeutic activities;Therapeutic exercise;Balance training;Patient/family education    PT Goals (Current goals can be found in the Care Plan section)  Acute Rehab PT Goals Patient Stated Goal: get home tomorrow PT Goal Formulation: With patient Time For Goal Achievement: 11/20/20 Potential to Achieve Goals: Good    Frequency 7X/week   Barriers to discharge        Co-evaluation               AM-PAC PT "6 Clicks" Mobility  Outcome Measure Help needed turning from your back to your side while in a flat bed without using bedrails?: A Little Help needed moving from lying on your back to sitting  on the side of a flat bed without using bedrails?: A Little Help needed moving to and from a bed to a chair (including a wheelchair)?: A Little Help needed standing up from a chair using your arms (e.g., wheelchair or bedside chair)?: A Little Help needed to walk in hospital room?: A Little Help needed climbing 3-5 steps with a railing? : A Lot 6 Click Score: 17    End of Session Equipment Utilized During Treatment: Gait belt Activity Tolerance: Patient tolerated treatment well Patient left: in chair;with call bell/phone within reach;with chair alarm set Nurse Communication: Mobility status PT Visit Diagnosis: Muscle weakness (generalized) (M62.81);Difficulty in walking, not elsewhere classified (R26.2)    Time: 2423-5361 PT Time Calculation (min) (ACUTE ONLY): 24 min   Charges:   PT Evaluation $PT Eval Low Complexity: 1 Low PT Treatments $Gait Training: 8-22 mins        Verner Mould, DPT Acute Rehabilitation Services Office [phone] Pager [phone]   Jacques Navy 11/13/2020, 5:13 PM

## 2020-11-13 NOTE — Anesthesia Procedure Notes (Signed)
Anesthesia Regional Block: Adductor canal block   Pre-Anesthetic Checklist: , timeout performed,  Correct Patient, Correct Site, Correct Laterality,  Correct Procedure, Correct Position, site marked,  Risks and benefits discussed,  Surgical consent,  Pre-op evaluation,  At surgeon's request and post-op pain management  Laterality: Right  Prep: chloraprep       Needles:  Injection technique: Single-shot  Needle Type: Echogenic Needle     Needle Length: 9cm      Additional Needles:   Procedures:,,,, ultrasound used (permanent image in chart),,    Narrative:  Start time: 11/13/2020 8:04 AM End time: 11/13/2020 8:11 AM Injection made incrementally with aspirations every 5 mL.  Performed by: Personally  Anesthesiologist: Myrtie Soman, MD  Additional Notes: Patient tolerated the procedure well without complications

## 2020-11-13 NOTE — Op Note (Signed)
NAME:  [name]                      MEDICAL RECORD NO.:  [id number]                             FACILITY:  [facility]      PHYSICIAN:  Pietro Cassis. Alvan Dame, M.D.  DATE OF BIRTH:  Jun 16, 1955      DATE OF PROCEDURE:  11/13/2020                                     OPERATIVE REPORT         PREOPERATIVE DIAGNOSIS:  Right knee osteoarthritis.      POSTOPERATIVE DIAGNOSIS:  Right knee osteoarthritis.      FINDINGS:  The patient was noted to have complete loss of cartilage and   bone-on-bone arthritis with associated osteophytes in the medial and patellofemoral compartments of   the knee with a significant synovitis and associated effusion.  The patient had failed months of conservative treatment including medications, injection therapy, activity modification.     PROCEDURE:  Right total knee replacement.      COMPONENTS USED:  DePuy Attune rotating platform posterior stabilized knee   system, a size 4N femur, 3 tibia, size 7 mm PS AOX insert, and 32 anatomic patellar   button.      SURGEON:  Pietro Cassis. Alvan Dame, M.D.      ASSISTANT:  Costella Hatcher, PA-C.      ANESTHESIA:  General and Regional.      SPECIMENS:  None.      COMPLICATION:  None.      DRAINS:  None.  EBL: <150 cc      TOURNIQUET TIME:  33 min at 225 mmHg     The patient was stable to the recovery room.      INDICATION FOR PROCEDURE:  [name] is a 65 y.o. female patient of   mine.  The patient had been seen, evaluated, and treated for months conservatively in the   office with medication, activity modification, and injections.  The patient had   radiographic changes of bone-on-bone arthritis with endplate sclerosis and osteophytes noted.  Based on the radiographic changes and failed conservative measures, the patient   decided to proceed with definitive treatment, total knee replacement.  Risks of infection, DVT, component failure, need for revision surgery, neurovascular injury were reviewed in the office  setting.  The postop course was reviewed stressing the efforts to maximize post-operative satisfaction and function.  Consent was obtained for benefit of pain   relief.      PROCEDURE IN DETAIL:  The patient was brought to the operative theater.   Once adequate anesthesia, preoperative antibiotics, 2 gm of Ancef,1 gm of Tranexamic Acid, and 10 mg of Decadron administered, the patient was positioned supine with a right thigh tourniquet placed.  The  right lower extremity was prepped and draped in sterile fashion.  A time-   out was performed identifying the patient, planned procedure, and the appropriate extremity.      The right lower extremity was placed in the [employer] leg holder.  The leg was   exsanguinated, tourniquet elevated to 250 mmHg.  A midline incision was   made followed by median parapatellar arthrotomy.  Following initial   exposure, attention was first directed  to the patella.  Precut   measurement was noted to be 22 mm.  I resected down to 13 mm and used a   32 anatomic patellar button to restore patellar height as well as cover the cut surface.      The lug holes were drilled and a metal shim was placed to protect the   patella from retractors and saw blade during the procedure.      At this point, attention was now directed to the femur.  The femoral   canal was opened with a drill, irrigated to try to prevent fat emboli.  An   intramedullary rod was passed at 3 degrees valgus, 9 mm of bone was   resected off the distal femur.  Following this resection, the tibia was   subluxated anteriorly.  Using the extramedullary guide, 3 mm of bone was resected off   the proximal medial tibia.  We confirmed the gap would be   stable medially and laterally with a size 5 spacer block as well as confirmed that the tibial cut was perpendicular in the coronal plane, checking with an alignment rod.      Once this was done, I sized the femur to be a size 4 in the anterior-   posterior  dimension, chose a narrow component based on medial and   lateral dimension.  The size 4 rotation block was then pinned in   position anterior referenced using the C-clamp to set rotation.  The   anterior, posterior, and  chamfer cuts were made without difficulty nor   notching making certain that I was along the anterior cortex to help   with flexion gap stability.      The final box cut was made off the lateral aspect of distal femur.      At this point, the tibia was sized to be a size 3.  The size 3 tray was   then pinned in position through the medial third of the tubercle,   drilled, and keel punched.  Trial reduction was now carried with a 4 femur,  3 tibia, a size 7 mm PS insert, and the 32 anatomic patella botton.  The knee was brought to full extension with good flexion stability with the patella   tracking through the trochlea without application of pressure.  Given   all these findings the trial components removed.  Final components were   opened and cement was mixed.  The knee was irrigated with normal saline solution and pulse lavage.  The synovial lining was   then injected with 30 cc of 0.25% Marcaine with epinephrine, 1 cc of Toradol and 30 cc of NS for a total of 61 cc.     Final implants were then cemented onto cleaned and dried cut surfaces of bone with the knee brought to extension with a size 7 mm PS trial insert.      Once the cement had fully cured, excess cement was removed   throughout the knee.  I confirmed that I was satisfied with the range of   motion and stability, and the final size 7 mm PS AOX insert was chosen.  It was   placed into the knee.      The tourniquet had been let down at 33 minutes.  No significant   hemostasis was required.  The extensor mechanism was then reapproximated using #1 Vicryl and #1 Stratafix sutures with the knee   in flexion.  The   remaining  wound was closed with 2-0 Vicryl and running 4-0 Monocryl.   The knee was cleaned,  dried, dressed sterilely using Dermabond and   Aquacel dressing.  The patient was then   brought to recovery room in stable condition, tolerating the procedure   well.   Please note that Physician Assistant, Costella Hatcher, PA-C was present for the entirety of the case, and was utilized for pre-operative positioning, peri-operative retractor management, general facilitation of the procedure and for primary wound closure at the end of the case.              Pietro Cassis Alvan Dame, M.D.    11/13/2020 10:19 AM

## 2020-11-13 NOTE — Anesthesia Procedure Notes (Addendum)
Procedure Name: LMA Insertion Date/Time: 11/13/2020 8:59 AM Performed by: West Pugh, CRNA Pre-anesthesia Checklist: Patient identified, Emergency Drugs available, Suction available, Patient being monitored and Timeout performed Patient Re-evaluated:Patient Re-evaluated prior to induction Oxygen Delivery Method: Circle system utilized Preoxygenation: Pre-oxygenation with 100% oxygen Induction Type: IV induction LMA: LMA with gastric port inserted Number of attempts: 1 Placement Confirmation: positive ETCO2 Tube secured with: Tape Dental Injury: Teeth and Oropharynx as per pre-operative assessment  Comments: Procedure performed by Virgia Land, CRNA

## 2020-11-13 NOTE — Anesthesia Postprocedure Evaluation (Signed)
Anesthesia Post Note  Patient: [name]  Procedure(s) Performed: TOTAL KNEE ARTHROPLASTY (Right: Knee)     Patient location during evaluation: PACU Anesthesia Type: Spinal Level of consciousness: oriented and awake and alert Pain management: pain level controlled Vital Signs Assessment: post-procedure vital signs reviewed and stable Respiratory status: spontaneous breathing, respiratory function stable and patient connected to nasal cannula oxygen Cardiovascular status: blood pressure returned to baseline and stable Postop Assessment: no headache, no backache and no apparent nausea or vomiting Anesthetic complications: no   No notable events documented.  Last Vitals:  Vitals:   11/13/20 1115 11/13/20 1200  BP: (!) 150/81 134/87  Pulse: 84 76  Resp: (!) 6 12  Temp:    SpO2: 98% 94%    Last Pain:  Vitals:   11/13/20 1200  TempSrc:   PainSc: Asleep                 Javonta Gronau S

## 2020-11-13 NOTE — Discharge Instructions (Addendum)
INSTRUCTIONS AFTER JOINT REPLACEMENT   Remove items at home which could result in a fall. This includes throw rugs or furniture in walking pathways ICE to the affected joint every three hours while awake for 30 minutes at a time, for at least the first 3-5 days, and then as needed for pain and swelling.  Continue to use ice for pain and swelling. You may notice swelling that will progress down to the foot and ankle.  This is normal after surgery.  Elevate your leg when you are not up walking on it.   Continue to use the breathing machine you got in the hospital (incentive spirometer) which will help keep your temperature down.  It is common for your temperature to cycle up and down following surgery, especially at night when you are not up moving around and exerting yourself.  The breathing machine keeps your lungs expanded and your temperature down.   DIET:  As you were doing prior to hospitalization, we recommend a well-balanced diet.  DRESSING / WOUND CARE / SHOWERING  Keep the surgical dressing until follow up.  The dressing is water proof, so you can shower without any extra covering.  IF THE DRESSING FALLS OFF or the wound gets wet inside, change the dressing with sterile gauze.  Please use good hand washing techniques before changing the dressing.  Do not use any lotions or creams on the incision until instructed by your surgeon.    ACTIVITY  Increase activity slowly as tolerated, but follow the weight bearing instructions below.   No driving for 6 weeks or until further direction given by your physician.  You cannot drive while taking narcotics.  No lifting or carrying greater than 10 lbs. until further directed by your surgeon. Avoid periods of inactivity such as sitting longer than an hour when not asleep. This helps prevent blood clots.  You may return to work once you are authorized by your doctor.     WEIGHT BEARING   Weight bearing as tolerated with assist device (walker, cane,  etc) as directed, use it as long as suggested by your surgeon or therapist, typically at least 4-6 weeks.   EXERCISES  Results after joint replacement surgery are often greatly improved when you follow the exercise, range of motion and muscle strengthening exercises prescribed by your doctor. Safety measures are also important to protect the joint from further injury. Any time any of these exercises cause you to have increased pain or swelling, decrease what you are doing until you are comfortable again and then slowly increase them. If you have problems or questions, call your caregiver or physical therapist for advice.   Rehabilitation is important following a joint replacement. After just a few days of immobilization, the muscles of the leg can become weakened and shrink (atrophy).  These exercises are designed to build up the tone and strength of the thigh and leg muscles and to improve motion. Often times heat used for twenty to thirty minutes before working out will loosen up your tissues and help with improving the range of motion but do not use heat for the first two weeks following surgery (sometimes heat can increase post-operative swelling).   These exercises can be done on a training (exercise) mat, on the floor, on a table or on a bed. Use whatever works the best and is most comfortable for you.    Use music or television while you are exercising so that the exercises are a pleasant break in your  day. This will make your life better with the exercises acting as a break in your routine that you can look forward to.   Perform all exercises about fifteen times, three times per day or as directed.  You should exercise both the operative leg and the other leg as well.  Exercises include:   Quad Sets - Tighten up the muscle on the front of the thigh (Quad) and hold for 5-10 seconds.   Straight Leg Raises - With your knee straight (if you were given a brace, keep it on), lift the leg to 60  degrees, hold for 3 seconds, and slowly lower the leg.  Perform this exercise against resistance later as your leg gets stronger.  Leg Slides: Lying on your back, slowly slide your foot toward your buttocks, bending your knee up off the floor (only go as far as is comfortable). Then slowly slide your foot back down until your leg is flat on the floor again.  Angel Wings: Lying on your back spread your legs to the side as far apart as you can without causing discomfort.  Hamstring Strength:  Lying on your back, push your heel against the floor with your leg straight by tightening up the muscles of your buttocks.  Repeat, but this time bend your knee to a comfortable angle, and push your heel against the floor.  You may put a pillow under the heel to make it more comfortable if necessary.   A rehabilitation program following joint replacement surgery can speed recovery and prevent re-injury in the future due to weakened muscles. Contact your doctor or a physical therapist for more information on knee rehabilitation.    CONSTIPATION  Constipation is defined medically as fewer than three stools per week and severe constipation as less than one stool per week.  Even if you have a regular bowel pattern at home, your normal regimen is likely to be disrupted due to multiple reasons following surgery.  Combination of anesthesia, postoperative narcotics, change in appetite and fluid intake all can affect your bowels.   YOU MUST use at least one of the following options; they are listed in order of increasing strength to get the job done.  They are all available over the counter, and you may need to use some, POSSIBLY even all of these options:    Drink plenty of fluids (prune juice may be helpful) and high fiber foods Colace 100 mg by mouth twice a day  Senokot for constipation as directed and as needed Dulcolax (bisacodyl), take with full glass of water  Miralax (polyethylene glycol) once or twice a day as  needed.  If you have tried all these things and are unable to have a bowel movement in the first 3-4 days after surgery call either your surgeon or your primary doctor.    If you experience loose stools or diarrhea, hold the medications until you stool forms back up.  If your symptoms do not get better within 1 week or if they get worse, check with your doctor.  If you experience "the worst abdominal pain ever" or develop nausea or vomiting, please contact the office immediately for further recommendations for treatment.   ITCHING:  If you experience itching with your medications, try taking only a single pain pill, or even half a pain pill at a time.  You can also use Benadryl over the counter for itching or also to help with sleep.   TED HOSE STOCKINGS:  Use stockings on both  legs until for at least 2 weeks or as directed by physician office. They may be removed at night for sleeping.  MEDICATIONS:  See your medication summary on the "After Visit Summary" that nursing will review with you.  You may have some home medications which will be placed on hold until you complete the course of blood thinner medication.  It is important for you to complete the blood thinner medication as prescribed.  PRECAUTIONS:  If you experience chest pain or shortness of breath - call 911 immediately for transfer to the hospital emergency department.   If you develop a fever greater that 101 F, purulent drainage from wound, increased redness or drainage from wound, foul odor from the wound/dressing, or calf pain - CONTACT YOUR SURGEON.                                                   FOLLOW-UP APPOINTMENTS:  If you do not already have a post-op appointment, please call the office for an appointment to be seen by your surgeon.  Guidelines for how soon to be seen are listed in your "After Visit Summary", but are typically between 1-4 weeks after surgery.  OTHER INSTRUCTIONS:   Knee Replacement:  Do not place pillow  under knee, focus on keeping the knee straight while resting. CPM instructions: 0-90 degrees, 2 hours in the morning, 2 hours in the afternoon, and 2 hours in the evening. Place foam block, curve side up under heel at all times except when in CPM or when walking.  DO NOT modify, tear, cut, or change the foam block in any way.  POST-OPERATIVE OPIOID TAPER INSTRUCTIONS: It is important to wean off of your opioid medication as soon as possible. If you do not need pain medication after your surgery it is ok to stop day one. Opioids include: Codeine, Hydrocodone(Norco, Vicodin), Oxycodone(Percocet, oxycontin) and hydromorphone amongst others.  Long term and even short term use of opiods can cause: Increased pain response Dependence Constipation Depression Respiratory depression And more.  Withdrawal symptoms can include Flu like symptoms Nausea, vomiting And more Techniques to manage these symptoms Hydrate well Eat regular healthy meals Stay active Use relaxation techniques(deep breathing, meditating, yoga) Do Not substitute Alcohol to help with tapering If you have been on opioids for less than two weeks and do not have pain than it is ok to stop all together.  Plan to wean off of opioids This plan should start within one week post op of your joint replacement. Maintain the same interval or time between taking each dose and first decrease the dose.  Cut the total daily intake of opioids by one tablet each day Next start to increase the time between doses. The last dose that should be eliminated is the evening dose.   MAKE SURE YOU:  Understand these instructions.  Get help right away if you are not doing well or get worse.    Thank you for letting us be a part of your medical care team.  It is a privilege we respect greatly.  We hope these instructions will help you stay on track for a fast and full recovery!     Information on my medicine - XARELTO (Rivaroxaban)  This  medication education was reviewed with me or my healthcare representative as part of my discharge preparation.  Why was Xarelto prescribed for you? Xarelto was prescribed for you to reduce the risk of blood clots forming after orthopedic surgery. The medical term for these abnormal blood clots is venous thromboembolism (VTE).  What do you need to know about xarelto ? Take your Xarelto ONCE DAILY at the same time every day. You may take it either with or without food.  If you have difficulty swallowing the tablet whole, you may crush it and mix in applesauce just prior to taking your dose.  Take Xarelto exactly as prescribed by your doctor and DO NOT stop taking Xarelto without talking to the doctor who prescribed the medication.  Stopping without other VTE prevention medication to take the place of Xarelto may increase your risk of developing a clot.  After discharge, you should have regular check-up appointments with your healthcare provider that is prescribing your Xarelto.    What do you do if you miss a dose? If you miss a dose, take it as soon as you remember on the same day then continue your regularly scheduled once daily regimen the next day. Do not take two doses of Xarelto on the same day.   Important Safety Information A possible side effect of Xarelto is bleeding. You should call your healthcare provider right away if you experience any of the following: Bleeding from an injury or your nose that does not stop. Unusual colored urine (red or dark brown) or unusual colored stools (red or black). Unusual bruising for unknown reasons. A serious fall or if you hit your head (even if there is no bleeding).  Some medicines may interact with Xarelto and might increase your risk of bleeding while on Xarelto. To help avoid this, consult your healthcare provider or pharmacist prior to using any new prescription or non-prescription medications, including herbals, vitamins,  non-steroidal anti-inflammatory drugs (NSAIDs) and supplements.  This website has more information on Xarelto: [email or web address]

## 2020-11-13 NOTE — Anesthesia Procedure Notes (Signed)
Anesthesia Procedure Image    

## 2020-11-13 NOTE — H&P (Signed)
TOTAL KNEE ADMISSION H&P  Patient is being admitted for right total knee arthroplasty.  Subjective:  Chief Complaint:right knee pain.  HPI: [name], 65 y.o. female, has a history of pain and functional disability in the right knee due to arthritis and has failed non-surgical conservative treatments for greater than 12 weeks to includeNSAID's and/or analgesics, corticosteriod injections, viscosupplementation injections, and activity modification.  Onset of symptoms was gradual, starting 3 years ago with gradually worsening course since that time. The patient noted prior procedures on the knee to include  arthroscopy and menisectomy on the right knee(s).  Patient currently rates pain in the right knee(s) at 9 out of 10 with activity. Patient has worsening of pain with activity and weight bearing, pain that interferes with activities of daily living, and pain with passive range of motion.  Patient has evidence of joint space narrowing by imaging studies.  There is no active infection.  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 Hilarie Fredrickson   Vitamin D deficiency     Past Surgical History:  Procedure Laterality Date   ABDOMINAL HYSTERECTOMY     partial   ANTERIOR LAT LUMBAR FUSION N/A 01/28/2016   Procedure: ANTERIOR LATERAL LUMBAR FUSION 2 LEVELS XLIF L2-4;  Surgeon: Melina Schools, MD;  Location: [facility];  Service: Orthopedics;  Laterality: N/A;   BACK SURGERY     BREAST EXCISIONAL BIOPSY Bilateral    benign   BREAST REDUCTION SURGERY Bilateral 06/16/2017   Procedure: BILATERAL MAMMARY REDUCTION  (BREAST);  Surgeon: Wallace Going, DO;  Location: [facility];  Service: Plastics;  Laterality: Bilateral;   BREAST SURGERY Bilateral    cysts 65 yrs old   COLONOSCOPY  20 + years ago   In [facility]   KNEE SURGERY Left 12-13,11-10   meniscal repairs   LUMBAR FUSION  01/28/2016   ANTERIOR LATERAL LUMBAR FUSION 2 LEVELS XLIF L2-4 (N/A)   TOTAL KNEE ARTHROPLASTY Left 01/14/2015   Procedure: TOTAL KNEE ARTHROPLASTY;  Surgeon: Paralee Cancel, MD;  Location: [facility];  Service: Orthopedics;  Laterality: Left;    Current Facility-Administered Medications  Medication Dose Route Frequency Provider Last Rate Last Admin   ceFAZolin (ANCEF) IVPB 2g/100 mL premix  2 g Intravenous On Call to OR Irving Copas, PA-C       chlorhexidine (PERIDEX) 0.12 % solution 15 mL  15 mL Mouth/Throat Once Nolon Nations, MD       Or   MEDLINE mouth rinse  15 mL Mouth Rinse Once Nolon Nations,  MD       dexamethasone (DECADRON) injection 8 mg  8 mg Intravenous Once Irving Copas, PA-C       fentaNYL (SUBLIMAZE) injection 50-100 mcg  50-100 mcg Intravenous Pollyann Glen, MD       lactated ringers infusion   Intravenous Continuous Irving Copas, PA-C       lactated ringers infusion   Intravenous Continuous Nolon Nations, MD       midazolam (VERSED) injection 1-2 mg  1-2 mg Intravenous Pollyann Glen, MD       povidone-iodine 10 % swab 2 application  2 application Topical Once Irving Copas, PA-C       tranexamic acid (CYKLOKAPRON) IVPB 1,000 mg  1,000 mg Intravenous To OR Irving Copas, PA-C       No Known Allergies  Social History   Tobacco Use   Smoking status: Never   Smokeless tobacco: Never  Substance Use Topics   Alcohol use: No    Alcohol/week: 0.0 standard drinks    Family History  Problem Relation Age of Onset   Sickle cell trait Mother    Diabetes Brother    Diabetes Daughter    Asthma Other    Colon cancer Neg Hx    Esophageal cancer Neg Hx    Rectal cancer Neg Hx    Stomach cancer Neg Hx      Review of Systems  Constitutional:  Negative for chills and fever.  Respiratory:  Negative for cough and shortness of breath.   Cardiovascular:  Negative for chest pain.  Gastrointestinal:  Negative for nausea and vomiting.  Musculoskeletal:  Positive for arthralgias.    Objective:  Physical Exam Well nourished and well developed. General: Alert and oriented x3, cooperative and pleasant, no acute distress. Head: normocephalic, atraumatic, neck supple. Eyes: EOMI.  Musculoskeletal: Right knee exam: No palpable effusion, warmth or erythema Tenderness mainly medially Slight flexion contracture with flexion of 110 degrees with some tightness No lower extremity edema, erythema or calf tenderness  Calves soft and nontender. Motor function intact in LE. Strength 5/5 LE bilaterally. Neuro: Distal pulses 2+. Sensation to light touch intact in LE  Vital signs in last 24 hours: Temp:  [97.9 F (36.6 C)] 97.9 F (36.6 C) (09/29 0602) Pulse Rate:  [79] 79 (09/29 0602) Resp:  [18] 18 (09/29 0602) BP: (141)/(73) 141/73 (09/29 0602) SpO2:  [99 %] 99 % (09/29 0602) Weight:  [92.1 kg] 92.1 kg (09/29 0628)  Labs:   Estimated body mass index is 37.13 kg/m as calculated from the following:   Height as of this encounter: 5' 2"  (1.575 m).   Weight as of this encounter: 92.1 kg.   Imaging Review Plain radiographs demonstrate severe  degenerative joint disease of the right knee(s). The overall alignment isneutral. The bone quality appears to be adequate for age and reported activity level.      Assessment/Plan:  End stage arthritis, right knee   The patient history, physical examination, clinical judgment of the provider and imaging studies are consistent with end stage degenerative joint disease of the right knee(s) and total knee arthroplasty is deemed medically necessary. The treatment options including medical management, injection therapy arthroscopy and arthroplasty were discussed at length. The risks and benefits of total knee arthroplasty were presented and reviewed. The risks due to aseptic loosening, infection, stiffness, patella tracking problems, thromboembolic complications and other imponderables were discussed. The patient acknowledged the explanation, agreed to proceed with the plan and  consent was signed. Patient is being admitted for inpatient treatment for surgery, pain control, PT, OT, prophylactic antibiotics, VTE prophylaxis, progressive ambulation and ADL's and discharge planning. The patient is planning to be discharged  home.  Therapy Plans: outpatient therapy at [facility] Disposition: Home with daughter Planned DVT Prophylaxis: Xarelto 16m daily x2 weeks, asa 81 daily for 4 weeks (fx histoy of DVT/PE & personal history of ulcerative colitis) DME needed: none PCP: Dr. TBirdie Riddle clearance received TXA: IV Allergies: NKDA Anesthesia Concerns: none BMI: 39.1 Last HgbA1c: Not diabetic.  Other: - Hx of mother & uncle dying of PE - Oxycodone, robaxin, toradol, tylenol - Dr. BRolena Infante- hx of fusion, right leg radiculopathy   Patient's anticipated LOS is less than 2 midnights, meeting these requirements: - Younger than 649- Lives within 1 hour of care - Has a competent adult at home to recover with post-op recover - NO history of  - Chronic pain requiring opiods  - Diabetes  - Coronary Artery  Disease  - Heart failure  - Heart attack  - Stroke  - DVT/VTE  - Cardiac arrhythmia  - Respiratory Failure/COPD  - Renal failure  - Anemia  - Advanced Liver disease  AGriffith Citron PA-C Orthopedic Surgery [facility] [place] ([phone]

## 2020-11-13 NOTE — Plan of Care (Signed)
  Problem: Activity: Goal: Ability to avoid complications of mobility impairment will improve Outcome: Progressing   Problem: Pain Management: Goal: Pain level will decrease with appropriate interventions Outcome: Progressing

## 2020-11-13 NOTE — Progress Notes (Signed)
Assisted Dr.George Rose with  Right Knee Adductor Canal block. Side rails up, monitors on throughout procedure. See vital signs in flow sheet. Tolerated Procedure well.

## 2020-11-13 NOTE — Plan of Care (Signed)

## 2020-11-13 NOTE — Transfer of Care (Signed)
Immediate Anesthesia Transfer of Care Note  Patient: [name]  Procedure(s) Performed: TOTAL KNEE ARTHROPLASTY (Right: Knee)  Patient Location: PACU  Anesthesia Type:GA combined with regional for post-op pain  Level of Consciousness: drowsy and patient cooperative  Airway & Oxygen Therapy: Patient Spontanous Breathing and Patient connected to face mask oxygen  Post-op Assessment: Report given to RN and Post -op Vital signs reviewed and stable  Post vital signs: Reviewed and stable  Last Vitals:  Vitals Value Taken Time  BP 132/90 11/13/20 1043  Temp    Pulse 84 11/13/20 1045  Resp 6 11/13/20 1045  SpO2 96 % 11/13/20 1045  Vitals shown include unvalidated device data.  Last Pain:  Vitals:   11/13/20 0628  TempSrc:   PainSc: 0-No pain      Patients Stated Pain Goal: 4 (19/80/22 1798)  Complications: No notable events documented.

## 2020-11-14 DIAGNOSIS — M1711 Unilateral primary osteoarthritis, right knee: Secondary | ICD-10-CM

## 2020-11-14 DIAGNOSIS — Z96652 Presence of left artificial knee joint: Secondary | ICD-10-CM | POA: Diagnosis not present

## 2020-11-14 DIAGNOSIS — J45909 Unspecified asthma, uncomplicated: Secondary | ICD-10-CM | POA: Diagnosis not present

## 2020-11-14 DIAGNOSIS — Z8616 Personal history of COVID-19: Secondary | ICD-10-CM | POA: Diagnosis not present

## 2020-11-14 LAB — CBC
HCT: 32.4 % — ABNORMAL LOW (ref 36.0–46.0)
Hemoglobin: 9.9 g/dL — ABNORMAL LOW (ref 12.0–15.0)
MCH: 22.9 pg — ABNORMAL LOW (ref 26.0–34.0)
MCHC: 30.6 g/dL (ref 30.0–36.0)
MCV: 74.8 fL — ABNORMAL LOW (ref 80.0–100.0)
Platelets: 257 10*3/uL (ref 150–400)
RBC: 4.33 MIL/uL (ref 3.87–5.11)
RDW: 15.2 % (ref 11.5–15.5)
WBC: 12.2 10*3/uL — ABNORMAL HIGH (ref 4.0–10.5)
nRBC: 0 % (ref 0.0–0.2)

## 2020-11-14 LAB — BASIC METABOLIC PANEL
Anion gap: 8 (ref 5–15)
BUN: 13 mg/dL (ref 8–23)
CO2: 24 mmol/L (ref 22–32)
Calcium: 9.2 mg/dL (ref 8.9–10.3)
Chloride: 108 mmol/L (ref 98–111)
Creatinine, Ser: 0.72 mg/dL (ref 0.44–1.00)
GFR, Estimated: >60 mL/min (ref >60)
Glucose, Bld: 126 mg/dL — ABNORMAL HIGH (ref 70–99)
Potassium: 4.4 mmol/L (ref 3.5–5.1)
Sodium: 140 mmol/L (ref 135–145)

## 2020-11-14 NOTE — Progress Notes (Signed)
Physical Therapy Treatment Patient Details Name: [name] MRN: [id number] DOB: 1955/12/27 Today's Date: 11/14/2020   History of Present Illness Patient is 65 y.o. female s/p Rt TKA on 11/13/20 with [place] significant for OA, COVID-19, GERD, scoliosis, Thalassemia, lumbar surgery, Lt TKA in 2016.    PT Comments    Pt continues to progress well with mobility including negotiating stairs.  Written HEP provided and reviewed.  Pt for first OP PT 11/17/20.   Recommendations for follow up therapy are one component of a multi-disciplinary discharge planning process, led by the attending physician.  Recommendations may be updated based on patient status, additional functional criteria and insurance authorization.  Follow Up Recommendations  Follow surgeon's recommendation for DC plan and follow-up therapies;Home health PT     Equipment Recommendations  None recommended by PT    Recommendations for Other Services       Precautions / Restrictions Precautions Precautions: Fall;Knee Restrictions Weight Bearing Restrictions: No RLE Weight Bearing: Weight bearing as tolerated     Mobility  Bed Mobility Overal bed mobility: Needs Assistance Bed Mobility: Supine to Sit;Sit to Supine     Supine to sit: Supervision Sit to supine: Supervision   General bed mobility comments: No physical assist    Transfers Overall transfer level: Needs assistance Equipment used: Rolling walker (2 wheeled) Transfers: Sit to/from Stand Sit to Stand: Supervision         General transfer comment: cues for LE management and use of UEs to self assist  Ambulation/Gait Ambulation/Gait assistance: Min guard;Supervision Gait Distance (Feet): 70 Feet Assistive device: Rolling walker (2 wheeled) Gait Pattern/deviations: Step-to pattern;Decreased stride length;Decreased weight shift to right Gait velocity: decr   General Gait Details: min cues for sequence, posture and position from  RW   Stairs Stairs: Yes Stairs assistance: Min assist Stair Management: No rails;Two rails;Step to pattern;Backwards;Forwards;With walker Number of Stairs: 9 General stair comments: single step twice bkwd with RW; 7 steps with bil rails.  Cues for sequence   Wheelchair Mobility    Modified Rankin (Stroke Patients Only)       Balance Overall balance assessment: Needs assistance Sitting-balance support: Feet supported Sitting balance-Leahy Scale: Good     Standing balance support: During functional activity;Bilateral upper extremity supported Standing balance-Leahy Scale: Fair                              Cognition Arousal/Alertness: Awake/alert Behavior During Therapy: WFL for tasks assessed/performed Overall Cognitive Status: Within Functional Limits for tasks assessed                                        Exercises Total Joint Exercises Ankle Circles/Pumps: AROM;Both;20 reps;Seated Quad Sets: AROM;Both;10 reps;Supine Heel Slides: AAROM;10 reps;Right;Supine Straight Leg Raises: AAROM;AROM;Right;10 reps;Supine    General Comments        Pertinent Vitals/Pain Pain Assessment: 0-10 Pain Score: 6  Pain Location: Rt knee Pain Descriptors / Indicators: Aching;Discomfort Pain Intervention(s): Limited activity within patient's tolerance;Monitored during session;Premedicated before session    Home Living                      Prior Function            PT Goals (current goals can now be found in the care plan section) Acute Rehab PT Goals Patient Stated Goal: regain iND  PT Goal Formulation: With patient Time For Goal Achievement: 11/20/20 Potential to Achieve Goals: Good Progress towards PT goals: Progressing toward goals    Frequency    7X/week      PT Plan Current plan remains appropriate    Co-evaluation              AM-PAC PT "6 Clicks" Mobility   Outcome Measure  Help needed turning from your back  to your side while in a flat bed without using bedrails?: A Little Help needed moving from lying on your back to sitting on the side of a flat bed without using bedrails?: A Little Help needed moving to and from a bed to a chair (including a wheelchair)?: A Little Help needed standing up from a chair using your arms (e.g., wheelchair or bedside chair)?: A Little Help needed to walk in hospital room?: A Little Help needed climbing 3-5 steps with a railing? : A Little 6 Click Score: 18    End of Session Equipment Utilized During Treatment: Gait belt Activity Tolerance: Patient tolerated treatment well Patient left: in bed;with call bell/phone within reach;with family/visitor present Nurse Communication: Mobility status PT Visit Diagnosis: Muscle weakness (generalized) (M62.81);Difficulty in walking, not elsewhere classified (R26.2)     Time: 4656-8127 PT Time Calculation (min) (ACUTE ONLY): 26 min  Charges:  $Gait Training: 8-22 mins $Therapeutic Exercise: 8-22 mins $Therapeutic Activity: 8-22 mins                     Debe Coder PT Acute Rehabilitation Services Pager [phone] Office [phone]    [facility] 11/14/2020, 12:33 PM

## 2020-11-14 NOTE — Progress Notes (Signed)
Physical Therapy Treatment Patient Details Name: [name] MRN: [id number] DOB: [birth year] Today's Date: 11/14/2020   History of Present Illness Patient is 65 y.o. female s/p Rt TKA on 11/13/20 with [place] significant for OA, COVID-19, GERD, scoliosis, Thalassemia, lumbar surgery, Lt TKA in 2016.    PT Comments    Pt progressing well with mobility but with c/o mild dizziness with ambulation - BP 145/71   Recommendations for follow up therapy are one component of a multi-disciplinary discharge planning process, led by the attending physician.  Recommendations may be updated based on patient status, additional functional criteria and insurance authorization.  Follow Up Recommendations  Follow surgeon's recommendation for DC plan and follow-up therapies;Home health PT     Equipment Recommendations  None recommended by PT    Recommendations for Other Services       Precautions / Restrictions Precautions Precautions: Fall;Knee Restrictions Weight Bearing Restrictions: No RLE Weight Bearing: Weight bearing as tolerated     Mobility  Bed Mobility Overal bed mobility: Needs Assistance Bed Mobility: Supine to Sit;Sit to Supine     Supine to sit: Min guard Sit to supine: Min guard        Transfers Overall transfer level: Needs assistance Equipment used: Rolling walker (2 wheeled) Transfers: Sit to/from Stand Sit to Stand: Min guard         General transfer comment: cues for LE management and use of UEs to self assist  Ambulation/Gait Ambulation/Gait assistance: Min guard Gait Distance (Feet): 120 Feet Assistive device: Rolling walker (2 wheeled) Gait Pattern/deviations: Step-to pattern;Decreased stride length;Decreased weight shift to right Gait velocity: decr   General Gait Details: cues for sequence, posture and position from [employer]             Wheelchair Mobility    Modified Rankin (Stroke Patients Only)       Balance Overall balance  assessment: Needs assistance Sitting-balance support: Feet supported Sitting balance-Leahy Scale: Good     Standing balance support: During functional activity;Bilateral upper extremity supported Standing balance-Leahy Scale: Poor                              Cognition Arousal/Alertness: Awake/alert Behavior During Therapy: WFL for tasks assessed/performed Overall Cognitive Status: Within Functional Limits for tasks assessed                                        Exercises Total Joint Exercises Ankle Circles/Pumps: AROM;Both;20 reps;Seated Quad Sets: AROM;Both;10 reps;Supine Heel Slides: AAROM;10 reps;Right;Supine Straight Leg Raises: AAROM;AROM;Right;10 reps;Supine    General Comments        Pertinent Vitals/Pain Pain Assessment: 0-10 Pain Score: 6  Pain Location: Rt knee Pain Descriptors / Indicators: Aching;Discomfort Pain Intervention(s): Limited activity within patient's tolerance;Monitored during session;Premedicated before session;Ice applied    Home Living                      Prior Function            PT Goals (current goals can now be found in the care plan section) Acute Rehab PT Goals Patient Stated Goal: regain iND PT Goal Formulation: With patient Time For Goal Achievement: 11/20/20 Potential to Achieve Goals: Good Progress towards PT goals: Progressing toward goals    Frequency    7X/week      PT  Plan Current plan remains appropriate    Co-evaluation              AM-PAC PT "6 Clicks" Mobility   Outcome Measure  Help needed turning from your back to your side while in a flat bed without using bedrails?: A Little Help needed moving from lying on your back to sitting on the side of a flat bed without using bedrails?: A Little Help needed moving to and from a bed to a chair (including a wheelchair)?: A Little Help needed standing up from a chair using your arms (e.g., wheelchair or bedside chair)?:  A Little Help needed to walk in hospital room?: A Little Help needed climbing 3-5 steps with a railing? : A Lot 6 Click Score: 17    End of Session Equipment Utilized During Treatment: Gait belt Activity Tolerance: Patient tolerated treatment well Patient left: in bed;with call bell/phone within reach;with family/visitor present Nurse Communication: Mobility status PT Visit Diagnosis: Muscle weakness (generalized) (M62.81);Difficulty in walking, not elsewhere classified (R26.2)     Time: 7473-4037 PT Time Calculation (min) (ACUTE ONLY): 29 min  Charges:  $Gait Training: 8-22 mins $Therapeutic Exercise: 8-22 mins                     [place] Pager [phone] Office [phone]    Brieonna Crutcher 11/14/2020, 12:29 PM

## 2020-11-14 NOTE — Plan of Care (Signed)
Patient dc'd

## 2020-11-14 NOTE — Progress Notes (Signed)
Patient and family verbalized understanding of dc instructions. Family has additional people coming to help assist patient with stairs. Family and patient feels confident with taking her home. Pharmacy already came by to educate patient on xarelto no further questions

## 2020-11-14 NOTE — Plan of Care (Signed)
  Problem: Education: Goal: Knowledge of the prescribed therapeutic regimen will improve Outcome: Progressing   Problem: Activity: Goal: Ability to avoid complications of mobility impairment will improve Outcome: Progressing   Problem: Pain Management: Goal: Pain level will decrease with appropriate interventions Outcome: Progressing   

## 2020-11-14 NOTE — Progress Notes (Signed)
   Subjective: 1 Day Post-Op Procedure(s) (LRB): TOTAL KNEE ARTHROPLASTY (Right) Patient reports pain as mild.   Patient seen in rounds by Dr. Alvan Dame. Patient is well, and has had no acute complaints or problems. No acute events overnight. She was originally going to go home yesterday, but progressed slowly in PACU. Foley catheter removed. Patient ambulated 12 feet with PT.  We will continue therapy today.   Objective: Vital signs in last 24 hours: Temp:  [97.6 F (36.4 C)-98.1 F (36.7 C)] 98.1 F (36.7 C) (09/30 0550) Pulse Rate:  [37-97] 97 (09/30 0550) Resp:  [6-18] 18 (09/30 0550) BP: (116-153)/(68-106) 126/68 (09/30 0550) SpO2:  [89 %-100 %] 96 % (09/30 0550)  Intake/Output from previous day:  Intake/Output Summary (Last 24 hours) at 11/14/2020 0732 Last data filed at 11/14/2020 0600 Gross per 24 hour  Intake 2618.2 ml  Output 450 ml  Net 2168.2 ml     Intake/Output this shift: No intake/output data recorded.  Labs: Recent Labs    11/14/20 0314  HGB 9.9*   Recent Labs    11/14/20 0314  WBC 12.2*  RBC 4.33  HCT 32.4*  PLT 257   Recent Labs    11/14/20 0314  NA 140  K 4.4  CL 108  CO2 24  BUN 13  CREATININE 0.72  GLUCOSE 126*  CALCIUM 9.2   No results for input(s): LABPT, INR in the last 72 hours.  Exam: General - Patient is Alert and Oriented Extremity - Neurologically intact Sensation intact distally Intact pulses distally Dorsiflexion/Plantar flexion intact Dressing - dressing C/D/I Motor Function - intact, moving foot and toes well on exam.   Past Medical History:  Diagnosis Date   Arthritis    Cataract    bilateral cataracts   COVID-19    GERD (gastroesophageal reflux disease)    Microcytic anemia    Migraine    Scoliosis    Thalassemia    Thalassemia    Ulcerative pancolitis (HCC)    Colonoscopy with Dr. Hilarie Fredrickson   Vitamin D deficiency     Assessment/Plan: 1 Day Post-Op Procedure(s) (LRB): TOTAL KNEE ARTHROPLASTY  (Right) Active Problems:   S/P total knee arthroplasty, right  Estimated body mass index is 37.13 kg/m as calculated from the following:   Height as of this encounter: 5' 2"  (1.575 m).   Weight as of this encounter: 92.1 kg. Advance diet Up with therapy D/C IV fluids   Patient's anticipated LOS is less than 2 midnights, meeting these requirements: - Younger than 34 - Lives within 1 hour of care - Has a competent adult at home to recover with post-op recover - NO history of  - Chronic pain requiring opiods  - Diabetes  - Coronary Artery Disease  - Heart failure  - Heart attack  - Stroke  - DVT/VTE  - Cardiac arrhythmia  - Respiratory Failure/COPD  - Renal failure  - Anemia  - Advanced Liver disease     DVT Prophylaxis - Xarelto Weight bearing as tolerated.  Plan is to go Home after hospital stay. Plan for discharge today following 1-2 sessions of PT as long as they are meeting their goals. Patient is scheduled for OPPT. Follow up in the office in 2 weeks.   Griffith Citron, PA-C Orthopedic Surgery [phone] 11/14/2020, 7:32 AM

## 2020-11-14 NOTE — TOC Transition Note (Signed)
Transition of Care [facility]) - CM/SW Discharge Note   Patient Details  Name: [name] MRN: [id number] Date of Birth: 1956-01-22  [facility] CM/SW Contact:  Lennart Pall, LCSW Phone Number: 11/14/2020, 11:13 AM   Clinical Narrative:    Met briefly with pt and confirming she has needed DME at home.  Plan for OPPT at [facility].  Provided her with discount care for Xarelto.  No further TOC needs.   Final next level of care: OP Rehab Barriers to Discharge: No Barriers Identified   Patient Goals and CMS Choice Patient states their goals for this hospitalization and ongoing recovery are:: return home      Discharge Placement                       Discharge Plan and Services                DME Arranged: N/A DME Agency: NA       HH Arranged: NA HH Agency: NA        Social Determinants of Health (SDOH) Interventions     Readmission Risk Interventions No flowsheet data found.

## 2020-11-16 ENCOUNTER — Encounter (HOSPITAL_COMMUNITY): Payer: Self-pay | Admitting: Orthopedic Surgery

## 2020-11-17 DIAGNOSIS — M25561 Pain in right knee: Secondary | ICD-10-CM | POA: Diagnosis not present

## 2020-11-20 NOTE — Discharge Summary (Signed)
Physician Discharge Summary   Patient ID: [name] MRN: [id number] DOB/AGE: [birth year] 65 y.o.  Admit date: 11/13/2020 Discharge date: 11/14/2020  Primary Diagnosis: Right knee osteoarthritis.   Admission Diagnoses:  Past Medical History:  Diagnosis Date   Arthritis    Cataract    bilateral cataracts   COVID-19    GERD (gastroesophageal reflux disease)    Microcytic anemia    Migraine    Scoliosis    Thalassemia    Thalassemia    Ulcerative pancolitis (HCC)    Colonoscopy with Dr. Hilarie Fredrickson   Vitamin D deficiency    Discharge Diagnoses:   Active Problems:   S/P total knee arthroplasty, right  Estimated body mass index is 37.13 kg/m as calculated from the following:   Height as of this encounter: 5' 2"  (1.575 m).   Weight as of this encounter: 92.1 kg.  Procedure:  Procedure(s) (LRB): TOTAL KNEE ARTHROPLASTY (Right)   Consults: None  HPI: [name] is a 65 y.o. female patient of   mine.  The patient had been seen, evaluated, and treated for months conservatively in the   office with medication, activity modification, and injections.  The patient had   radiographic changes of bone-on-bone arthritis with endplate sclerosis and osteophytes noted.  Based on the radiographic changes and failed conservative measures, the patient   decided to proceed with definitive treatment, total knee replacement.  Risks of infection, DVT, component failure, need for revision surgery, neurovascular injury were reviewed in the office setting.  The postop course was reviewed stressing the efforts to maximize post-operative satisfaction and function.  Consent was obtained for benefit of pain   relief.   Laboratory Data: Admission on 11/13/2020, Discharged on 11/14/2020  Component Date Value Ref Range Status   WBC 11/14/2020 12.2 (A) 4.0 - 10.5 K/uL Final   RBC 11/14/2020 4.33  3.87 - 5.11 MIL/uL Final   Hemoglobin 11/14/2020 9.9 (A) 12.0 - 15.0 g/dL Final   HCT 11/14/2020 32.4  (A) 36.0 - 46.0 % Final   MCV 11/14/2020 74.8 (A) 80.0 - 100.0 fL Final   MCH 11/14/2020 22.9 (A) 26.0 - 34.0 pg Final   MCHC 11/14/2020 30.6  30.0 - 36.0 g/dL Final   RDW 11/14/2020 15.2  11.5 - 15.5 % Final   Platelets 11/14/2020 257  150 - 400 K/uL Final   nRBC 11/14/2020 0.0  0.0 - 0.2 % Final   Performed at [facility], [place] [address], [place], [place] [address]   Sodium 11/14/2020 140  135 - 145 mmol/L Final   Potassium 11/14/2020 4.4  3.5 - 5.1 mmol/L Final   Chloride 11/14/2020 108  98 - 111 mmol/L Final   CO2 11/14/2020 24  22 - 32 mmol/L Final   Glucose, Bld 11/14/2020 126 (A) 70 - 99 mg/dL Final   Glucose reference range applies only to samples taken after fasting for at least 8 hours.   BUN 11/14/2020 13  8 - 23 mg/dL Final   Creatinine, Ser 11/14/2020 0.72  0.44 - 1.00 mg/dL Final   Calcium 11/14/2020 9.2  8.9 - 10.3 mg/dL Final   GFR, Estimated 11/14/2020 >60  >60 mL/min Final   Comment: (NOTE) Calculated using the CKD-EPI Creatinine Equation (2021)    Anion gap 11/14/2020 8  5 - 15 Final   Performed at [facility], [place] [address], [place], [place] [address]  Hospital Outpatient Visit on 10/31/2020  Component Date Value Ref Range Status   WBC 10/31/2020 5.5  4.0 - 10.5 K/uL Final   RBC 10/31/2020 5.13 (A) 3.87 - 5.11 MIL/uL Final   Hemoglobin 10/31/2020 11.6 (A) 12.0 - 15.0 g/dL Final   HCT 10/31/2020 37.3  36.0 - 46.0 % Final   MCV 10/31/2020 72.7 (A) 80.0 - 100.0 fL Final   MCH 10/31/2020 22.6 (A) 26.0 - 34.0 pg Final   MCHC 10/31/2020 31.1  30.0 - 36.0 g/dL Final   RDW 10/31/2020 14.8  11.5 - 15.5 % Final   Platelets 10/31/2020 301  150 - 400 K/uL Final   nRBC 10/31/2020 0.0  0.0 - 0.2 % Final   Performed at [facility], [place] [address] [place], [place] [address]   Sodium 10/31/2020 145  135 - 145 mmol/L Final   Potassium 10/31/2020 4.4  3.5 - 5.1 mmol/L Final   Chloride 10/31/2020 113 (A) 98 - 111  mmol/L Final   CO2 10/31/2020 26  22 - 32 mmol/L Final   Glucose, Bld 10/31/2020 89  70 - 99 mg/dL Final   Glucose reference range applies only to samples taken after fasting for at least 8 hours.   BUN 10/31/2020 11  8 - 23 mg/dL Final   Creatinine, Ser 10/31/2020 0.71  0.44 - 1.00 mg/dL Final   Calcium 10/31/2020 9.8  8.9 - 10.3 mg/dL Final   Total Protein 10/31/2020 7.7  6.5 - 8.1 g/dL Final   Albumin 10/31/2020 4.0  3.5 - 5.0 g/dL Final   AST 10/31/2020 26  15 - 41 U/L Final   ALT 10/31/2020 13  0 - 44 U/L Final   Alkaline Phosphatase 10/31/2020 59  38 - 126 U/L Final   Total Bilirubin 10/31/2020 0.5  0.3 - 1.2 mg/dL Final   GFR, Estimated 10/31/2020 >60  >60 mL/min Final   Comment: (NOTE) Calculated using the CKD-EPI Creatinine Equation (2021)    Anion gap 10/31/2020 6  5 - 15 Final   Performed at [facility], [place] [address], [place], [place] [address]   ABO/RH(D) 10/31/2020 B POS   Final   Antibody Screen 10/31/2020 NEG   Final   Sample Expiration 10/31/2020 11/14/2020,2359   Final   Extend sample reason 10/31/2020    Final                   Value:NO TRANSFUSIONS OR PREGNANCY IN THE PAST 3 MONTHS Performed at [place] [address], [place], [place] [address]    MRSA, PCR 10/31/2020 NEGATIVE  NEGATIVE Final   Staphylococcus aureus 10/31/2020 NEGATIVE  NEGATIVE Final   Comment: (NOTE) The Xpert SA Assay (FDA approved for NASAL specimens in patients 62 years of age and older), is one component of a comprehensive surveillance program. It is not intended to diagnose infection nor to guide or monitor treatment. Performed at [facility], [place] [address], [place], [place] [address]      X-Rays:No results found.  EKG: Orders placed or performed in visit on 09/02/20   EKG 12-Lead     Hospital Course: [name] is a 65 y.o. who was admitted to [facility]. They were brought to the operating room on  11/13/2020 and underwent Procedure(s): TOTAL KNEE ARTHROPLASTY.  Patient tolerated the procedure well and was later transferred to the recovery room and then to the orthopaedic floor for postoperative care. They were given PO and IV analgesics for pain control following their surgery. They were given 24 hours of postoperative antibiotics of  Anti-infectives (From  admission, onward)    Start     Dose/Rate Route Frequency Ordered Stop   11/13/20 1500  ceFAZolin (ANCEF) IVPB 2g/100 mL premix        2 g 200 mL/hr over 30 Minutes Intravenous Every 6 hours 11/13/20 1047 11/13/20 2202   11/13/20 0615  ceFAZolin (ANCEF) IVPB 2g/100 mL premix        2 g 200 mL/hr over 30 Minutes Intravenous On call to O.R. 11/13/20 9233 11/13/20 0910      and started on DVT prophylaxis in the form of Xarelto.   PT and OT were ordered for total joint protocol. Discharge planning consulted to help with postop disposition and equipment needs.  Patient had a good night on the evening of surgery. They started to get up OOB with therapy on POD #0. Pt was seen during rounds and was ready to go home pending progress with therapy.She worked with therapy on POD #1 and was meeting her goals. Pt was discharged to home later that day in stable condition.  Diet: Regular diet Activity: WBAT Follow-up: in 2 weeks Disposition: Home Discharged Condition: good   Discharge Instructions     Call MD / Call 911   Complete by: As directed    If you experience chest pain or shortness of breath, CALL 911 and be transported to the hospital emergency room.  If you develope a fever above 101 F, pus (white drainage) or increased drainage or redness at the wound, or calf pain, call your surgeon's office.   Call MD / Call 911   Complete by: As directed    If you experience chest pain or shortness of breath, CALL 911 and be transported to the hospital emergency room.  If you develope a fever above 101 F, pus (white drainage) or increased drainage  or redness at the wound, or calf pain, call your surgeon's office.   Change dressing   Complete by: As directed    Maintain surgical dressing until follow up in the clinic. If the edges start to pull up, may reinforce with tape. If the dressing is no longer working, may remove and cover with gauze and tape, but must keep the area dry and clean.  Call with any questions or concerns.   Change dressing   Complete by: As directed    Maintain surgical dressing until follow up in the clinic. If the edges start to pull up, may reinforce with tape. If the dressing is no longer working, may remove and cover with gauze and tape, but must keep the area dry and clean.  Call with any questions or concerns.   Constipation Prevention   Complete by: As directed    Drink plenty of fluids.  Prune juice may be helpful.  You may use a stool softener, such as Colace (over the counter) 100 mg twice a day.  Use MiraLax (over the counter) for constipation as needed.   Constipation Prevention   Complete by: As directed    Drink plenty of fluids.  Prune juice may be helpful.  You may use a stool softener, such as Colace (over the counter) 100 mg twice a day.  Use MiraLax (over the counter) for constipation as needed.   Diet - low sodium heart healthy   Complete by: As directed    Diet - low sodium heart healthy   Complete by: As directed    Increase activity slowly as tolerated   Complete by: As directed    Weight bearing  as tolerated with assist device (walker, cane, etc) as directed, use it as long as suggested by your surgeon or therapist, typically at least 4-6 weeks.   Increase activity slowly as tolerated   Complete by: As directed    Weight bearing as tolerated with assist device (walker, cane, etc) as directed, use it as long as suggested by your surgeon or therapist, typically at least 4-6 weeks.   Post-operative opioid taper instructions:   Complete by: As directed    POST-OPERATIVE OPIOID TAPER  INSTRUCTIONS: It is important to wean off of your opioid medication as soon as possible. If you do not need pain medication after your surgery it is ok to stop day one. Opioids include: Codeine, Hydrocodone(Norco, Vicodin), Oxycodone(Percocet, oxycontin) and hydromorphone amongst others.  Long term and even short term use of opiods can cause: Increased pain response Dependence Constipation Depression Respiratory depression And more.  Withdrawal symptoms can include Flu like symptoms Nausea, vomiting And more Techniques to manage these symptoms Hydrate well Eat regular healthy meals Stay active Use relaxation techniques(deep breathing, meditating, yoga) Do Not substitute Alcohol to help with tapering If you have been on opioids for less than two weeks and do not have pain than it is ok to stop all together.  Plan to wean off of opioids This plan should start within one week post op of your joint replacement. Maintain the same interval or time between taking each dose and first decrease the dose.  Cut the total daily intake of opioids by one tablet each day Next start to increase the time between doses. The last dose that should be eliminated is the evening dose.      Post-operative opioid taper instructions:   Complete by: As directed    POST-OPERATIVE OPIOID TAPER INSTRUCTIONS: It is important to wean off of your opioid medication as soon as possible. If you do not need pain medication after your surgery it is ok to stop day one. Opioids include: Codeine, Hydrocodone(Norco, Vicodin), Oxycodone(Percocet, oxycontin) and hydromorphone amongst others.  Long term and even short term use of opiods can cause: Increased pain response Dependence Constipation Depression Respiratory depression And more.  Withdrawal symptoms can include Flu like symptoms Nausea, vomiting And more Techniques to manage these symptoms Hydrate well Eat regular healthy meals Stay active Use  relaxation techniques(deep breathing, meditating, yoga) Do Not substitute Alcohol to help with tapering If you have been on opioids for less than two weeks and do not have pain than it is ok to stop all together.  Plan to wean off of opioids This plan should start within one week post op of your joint replacement. Maintain the same interval or time between taking each dose and first decrease the dose.  Cut the total daily intake of opioids by one tablet each day Next start to increase the time between doses. The last dose that should be eliminated is the evening dose.      TED hose   Complete by: As directed    Use stockings (TED hose) for 2 weeks on both leg(s).  You may remove them at night for sleeping.   TED hose   Complete by: As directed    Use stockings (TED hose) for 2 weeks on both leg(s).  You may remove them at night for sleeping.      Allergies as of 11/14/2020   No Known Allergies      Medication List     TAKE these medications    acetaminophen  650 MG CR tablet Commonly known as: TYLENOL Take 1,300 mg by mouth every 8 (eight) hours as needed for pain.   calcium carbonate 500 MG chewable tablet Commonly known as: TUMS - dosed in mg elemental calcium Chew 1 tablet by mouth daily as needed for indigestion or heartburn.   docusate sodium 100 MG capsule Commonly known as: Colace Take 1 capsule (100 mg total) by mouth 2 (two) times daily.   folic acid 1 MG tablet Commonly known as: FOLVITE TAKE 1 TABLET(1 MG) BY MOUTH DAILY   hydrocortisone cream 1 % Apply 1 application topically 2 (two) times daily as needed for itching.   methocarbamol 500 MG tablet Commonly known as: Robaxin Take 1 tablet (500 mg total) by mouth every 6 (six) hours as needed for muscle spasms.   mupirocin cream 2 % Commonly known as: Bactroban Apply 1 application topically 2 (two) times daily.   mupirocin ointment 2 % Commonly known as: BACTROBAN Apply 1 application topically 2  (two) times daily. What changed:  when to take this reasons to take this   oxyCODONE 5 MG immediate release tablet Commonly known as: Roxicodone Take 1-2 tablets (5-10 mg total) by mouth every 4 (four) hours as needed for severe pain.   pantoprazole 40 MG tablet Commonly known as: PROTONIX Take 1 tablet (40 mg total) by mouth 2 (two) times daily.   polyethylene glycol 17 g packet Commonly known as: MIRALAX / GLYCOLAX Take 17 g by mouth 2 (two) times daily.   rivaroxaban 10 MG Tabs tablet Commonly known as: XARELTO Take 1 tablet (10 mg total) by mouth daily for 14 days. Then take aspirin 81 mg BID for another month.   sulfaSALAzine 500 MG tablet Commonly known as: AZULFIDINE Take 2 tablets (1,000 mg total) by mouth 2 (two) times daily. TAKE 1 TABLET BY MOUTH TWICE DAILY FOR 1 WEEK. INCREASE TO 2 TABLETS BY MOUTH 2 TIMES DAILY THEREAFTER   SUMAtriptan 100 MG tablet Commonly known as: IMITREX Take 1 tablet (100 mg total) by mouth every 2 (two) hours as needed for migraine.               Discharge Care Instructions  (From admission, onward)           Start     Ordered   11/14/20 0000  Change dressing       Comments: Maintain surgical dressing until follow up in the clinic. If the edges start to pull up, may reinforce with tape. If the dressing is no longer working, may remove and cover with gauze and tape, but must keep the area dry and clean.  Call with any questions or concerns.   11/14/20 0733   11/13/20 0000  Change dressing       Comments: Maintain surgical dressing until follow up in the clinic. If the edges start to pull up, may reinforce with tape. If the dressing is no longer working, may remove and cover with gauze and tape, but must keep the area dry and clean.  Call with any questions or concerns.   11/13/20 1305            Follow-up Information     Irving Copas, PA-C. Go on 11/26/2020.   Specialty: Orthopedic Surgery Why: You are scheduled  for a follow up appointment on 11-26-20 at 9:45 am. Contact information: [address] [place] [address] [phone]  Signed: Griffith Citron, PA-C Orthopedic Surgery 11/20/2020, 3:30 PM

## 2020-11-21 DIAGNOSIS — M25561 Pain in right knee: Secondary | ICD-10-CM | POA: Diagnosis not present

## 2020-12-02 DIAGNOSIS — M25561 Pain in right knee: Secondary | ICD-10-CM | POA: Diagnosis not present

## 2020-12-04 DIAGNOSIS — M25561 Pain in right knee: Secondary | ICD-10-CM | POA: Diagnosis not present

## 2020-12-11 DIAGNOSIS — M25561 Pain in right knee: Secondary | ICD-10-CM | POA: Diagnosis not present

## 2020-12-17 DIAGNOSIS — M25561 Pain in right knee: Secondary | ICD-10-CM | POA: Diagnosis not present

## 2020-12-17 DIAGNOSIS — Z0289 Encounter for other administrative examinations: Secondary | ICD-10-CM | POA: Diagnosis not present

## 2020-12-24 DIAGNOSIS — M25561 Pain in right knee: Secondary | ICD-10-CM | POA: Diagnosis not present

## 2020-12-24 DIAGNOSIS — Z471 Aftercare following joint replacement surgery: Secondary | ICD-10-CM | POA: Diagnosis not present

## 2020-12-24 DIAGNOSIS — Z96651 Presence of right artificial knee joint: Secondary | ICD-10-CM | POA: Diagnosis not present

## 2020-12-26 DIAGNOSIS — M545 Low back pain, unspecified: Secondary | ICD-10-CM | POA: Diagnosis not present

## 2021-01-05 DIAGNOSIS — M25561 Pain in right knee: Secondary | ICD-10-CM | POA: Diagnosis not present

## 2021-01-20 DIAGNOSIS — M5416 Radiculopathy, lumbar region: Secondary | ICD-10-CM | POA: Diagnosis not present

## 2021-02-02 DIAGNOSIS — M545 Low back pain, unspecified: Secondary | ICD-10-CM | POA: Diagnosis not present

## 2021-02-09 ENCOUNTER — Other Ambulatory Visit: Payer: Self-pay | Admitting: Internal Medicine

## 2021-03-19 DIAGNOSIS — H2513 Age-related nuclear cataract, bilateral: Secondary | ICD-10-CM | POA: Diagnosis not present

## 2021-04-09 ENCOUNTER — Encounter: Payer: Self-pay | Admitting: Family Medicine

## 2021-04-09 ENCOUNTER — Ambulatory Visit (INDEPENDENT_AMBULATORY_CARE_PROVIDER_SITE_OTHER): Payer: Medicare Other | Admitting: Family Medicine

## 2021-04-09 ENCOUNTER — Ambulatory Visit (HOSPITAL_BASED_OUTPATIENT_CLINIC_OR_DEPARTMENT_OTHER)
Admission: RE | Admit: 2021-04-09 | Discharge: 2021-04-09 | Disposition: A | Discharge status: Home / Self Care | Payer: Medicare Other | Source: Ambulatory Visit | Attending: Family Medicine | Admitting: Family Medicine

## 2021-04-09 ENCOUNTER — Other Ambulatory Visit: Payer: Self-pay

## 2021-04-09 VITALS — BP 120/72 | HR 81 | Temp 97.9°F | Resp 16 | Wt 205.2 lb

## 2021-04-09 DIAGNOSIS — M79662 Pain in left lower leg: Secondary | ICD-10-CM

## 2021-04-09 DIAGNOSIS — M79605 Pain in left leg: Secondary | ICD-10-CM | POA: Diagnosis not present

## 2021-04-09 DIAGNOSIS — M7989 Other specified soft tissue disorders: Secondary | ICD-10-CM

## 2021-04-09 DIAGNOSIS — M79652 Pain in left thigh: Secondary | ICD-10-CM | POA: Insufficient documentation

## 2021-04-09 NOTE — Progress Notes (Signed)
° °  Subjective:    Patient ID: [name], female    DOB: [birth year], 66 y.o.   MRN: [id number]  HPI Leg pain- L leg.  Primarily in lower leg and anterolateral thigh.  She states she will occasionally have swelling.  Pain is worse at night but present during the day.  Worse w/ walking for any period of time.  Has tried wrapping the lower leg, ice, lidocaine gel, and OTC relaxing leg cream w/ only mild/temporary relief.  Pain is described as a throbbing tightness and she finds herself constantly rubbingPt reports pain for 'at least a year' and has been worsening to the point that it is difficult to walk.  Mother died of blood clot which is concerning to pt.     Review of Systems For ROS see HPI    This visit occurred during the SARS-CoV-2 public health emergency.  Safety protocols were in place, including screening questions prior to the visit, additional usage of staff PPE, and extensive cleaning of exam room while observing appropriate contact time as indicated for disinfecting solutions.      Objective:   Physical Exam Vitals reviewed.  Constitutional:      General: She is not in acute distress.    Appearance: Normal appearance. She is not ill-appearing.  HENT:     Head: Normocephalic and atraumatic.  Eyes:     Extraocular Movements: Extraocular movements intact.     Conjunctiva/sclera: Conjunctivae normal.     Pupils: Pupils are equal, round, and reactive to light.  Cardiovascular:     Pulses: Normal pulses.  Musculoskeletal:        General: Tenderness (TTP over L anterolateral thigh, lateral lower leg and calf) present. No swelling or deformity.  Skin:    General: Skin is warm and dry.     Findings: No erythema.  Neurological:     General: No focal deficit present.     Mental Status: She is alert and oriented to person, place, and time.     Sensory: No sensory deficit.     Motor: No weakness.     Coordination: Coordination normal.  Psychiatric:        Mood and Affect:  Mood normal.        Behavior: Behavior normal.          Assessment & Plan:  L thigh and lower leg pain- new to provider, ongoing for pt.  No obvious swelling today, but pt is TTP over lower leg and anterior thigh.  + family hx of fatal blood clots.  She has had sxs for over a year which makes this less likely but still needs to be ruled out.  Will get venous dopplers to assess.  If negative- which I suspect- will refer to Sports Med to see if there's an issue w/ gait or leg length or other issue that is causing her ongoing pain.  Pt expressed understanding and is in agreement w/ plan.

## 2021-04-09 NOTE — Patient Instructions (Signed)
Once we have the results of your ultrasound we will determine the next steps Continue to ice for pain relief Call with any questions or concerns Hang in there!!!

## 2021-04-10 ENCOUNTER — Telehealth: Payer: Self-pay

## 2021-04-10 NOTE — Telephone Encounter (Signed)
-----   Message from Midge Minium, MD sent at 04/10/2021  7:23 AM EST ----- No evidence of blood clot.  This is great news!!  We will go ahead and refer you to Sports Medicine for the ongoing pain

## 2021-04-10 NOTE — Addendum Note (Signed)
Addended by: Midge Minium on: 04/10/2021 07:23 AM   Modules accepted: Orders

## 2021-04-10 NOTE — Telephone Encounter (Signed)
Patient aware, voiced understanding.

## 2021-04-20 DIAGNOSIS — M545 Low back pain, unspecified: Secondary | ICD-10-CM | POA: Diagnosis not present

## 2021-04-21 ENCOUNTER — Telehealth: Payer: Self-pay | Admitting: Pharmacist

## 2021-04-21 NOTE — Progress Notes (Signed)
? ? ?Chronic Care Management ?Pharmacy Assistant  ? ?Name: [name]  MRN: [id number] DOB: May 15, 1955 ? ? ?Reason for Encounter: Disease State - General Adherence Call  ?  ? ?Recent office visits:  ?04/09/21 Patricia Asa, MD - Family Medicine - Pain and Swelling of Left lower leg - US venous lower leg and referral to sports medicine placed. Follow up once [place] results received.  ? ? ?Recent consult visits:  ?12/11/20 [name] - Physical Therapy - No notes available. ? ?12/04/20 [name] - Physical Therapy - No notes available. ? ?12/02/20 [name] - Physical Therapy - No notes available. ? ?11/21/20 [name] - Physical Therapy - No notes available. ? ?11/17/20 [name] - Physical Therapy - No notes available. ? ?11/03/20 [name] - Cataract - No notes available.  ? ? ?Hospital visits: 11/13/20-11/14/20 ?Medication Reconciliation was completed by comparing discharge summary, patient?s EMR and Pharmacy list, and upon discussion with patient. ? ?Admitted to the hospital on 11/13/20 due to Total knee replacement. Discharge date was 11/14/20. Discharged from [facility].   ? ?New?Medications Started at [facility] Discharge:?? ?docusate sodium (Colace) ?methocarbamol (Robaxin) ?oxyCODONE (Roxicodone) ?polyethylene glycol (MIRALAX / GLYCOLAX) ?rivaroxaban ? ?Medication Changes at Hospital Discharge: ?None noted.  ? ?Medications Discontinued at Hospital Discharge: ?None noted.  ? ?Medications that remain the same after Hospital Discharge:??  ?All other medications will remain the same.   ? ?Medications: ?Outpatient Encounter Medications as of 04/21/2021  ?Medication Sig  ? acetaminophen (TYLENOL) 650 MG CR tablet Take 1,300 mg by mouth every 8 (eight) hours as needed for pain.  ? calcium carbonate (TUMS - DOSED IN MG ELEMENTAL CALCIUM) 500 MG chewable tablet Chew 1 tablet by mouth daily as needed for indigestion or heartburn. (Patient not taking: Reported on 04/09/2021)  ? folic acid  (FOLVITE) 1 MG tablet TAKE 1 TABLET(1 MG) BY MOUTH DAILY  ? hydrocortisone cream 1 % Apply 1 application topically 2 (two) times daily as needed for itching.  ? methocarbamol (ROBAXIN) 500 MG tablet Take 1 tablet (500 mg total) by mouth every 6 (six) hours as needed for muscle spasms.  ? mupirocin cream (BACTROBAN) 2 % Apply 1 application topically 2 (two) times daily. (Patient not taking: Reported on 10/28/2020)  ? mupirocin ointment (BACTROBAN) 2 % Apply 1 application topically 2 (two) times daily. (Patient taking differently: Apply 1 application topically daily as needed (ingrown hair).)  ? pantoprazole (PROTONIX) 40 MG tablet Take 1 tablet (40 mg total) by mouth 2 (two) times daily.  ? sulfaSALAzine (AZULFIDINE) 500 MG tablet Take 2 tablets (1,000 mg total) by mouth 2 (two) times daily. TAKE 1 TABLET BY MOUTH TWICE DAILY FOR 1 WEEK. INCREASE TO 2 TABLETS BY MOUTH 2 TIMES DAILY THEREAFTER  ? SUMAtriptan (IMITREX) 100 MG tablet Take 1 tablet (100 mg total) by mouth every 2 (two) hours as needed for migraine.  ? ?No facility-administered encounter medications on file as of 04/21/2021.  ? ?Have you had any problems recently with your health? ? ? ?Have you had any problems with your pharmacy? ? ? ?What issues or side effects are you having with your medications? ? ? ?What would you like me to pass along to Leata Mouse, CPP for them to help you with?  ? ? ?What can we do to take care of you better? ? ? ?Care Gaps ? ?AWV: due ?Colonoscopy: done 06/20/20 ?DM Eye Exam: N/A ?DM Foot Exam: N/A ?Microalbumin: N/A ?HbgAIC:  N/A ?DEXA: done 06/23/12 ?Mammogram:  done 07/17/20 ? ? ?Star Rating Drugs: ?No Star Rating Drugs Noted.  ? ? ?Future Appointments  ?Date Time Provider [place]  ?04/23/2021  9:50 AM Rosemarie Ax, MD [facility]  ?07/01/2021  1:00 PM [facility] PHARMACIST [facility]  ? ? ?Liza Showfety, CCMA ?Clinical Pharmacist Assistant  ?[phone] ? ? ?

## 2021-04-23 ENCOUNTER — Ambulatory Visit (INDEPENDENT_AMBULATORY_CARE_PROVIDER_SITE_OTHER): Payer: Medicare Other | Admitting: Family Medicine

## 2021-04-23 ENCOUNTER — Encounter: Payer: Self-pay | Admitting: Family Medicine

## 2021-04-23 VITALS — BP 120/82 | Ht 62.0 in | Wt 205.0 lb

## 2021-04-23 DIAGNOSIS — Z96651 Presence of right artificial knee joint: Secondary | ICD-10-CM

## 2021-04-23 DIAGNOSIS — Z96652 Presence of left artificial knee joint: Secondary | ICD-10-CM

## 2021-04-23 DIAGNOSIS — M79605 Pain in left leg: Secondary | ICD-10-CM

## 2021-04-23 MED ORDER — HYDROCODONE-ACETAMINOPHEN 5-325 MG PO TABS: 1.0000 | ORAL_TABLET | Freq: Three times a day (TID) | ORAL | 0 refills | Status: DC | PRN

## 2021-04-23 NOTE — Assessment & Plan Note (Signed)
Acute on chronic in nature.  No atrophy appreciated.  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  She has weakness with hip flexion as well as extension when compared to the contralateral side.  No signs of atrophy. ?-Counseled on home exercise therapy and supportive care. ?-Referral to physical therapy. ?-Norco as needed. ?-Reports her back surgeon is getting an updated MRI.  If unrevealing will consider nerve study in the left leg. ?

## 2021-04-23 NOTE — Progress Notes (Signed)
?[name] - 66 y.o. female MRN [id number]  Date of birth: November 26, 1955 ? ?SUBJECTIVE:  Including CC & ROS.  ?No chief complaint on file. ? ? ?[name] is a 66 y.o. female that is presenting with left leg pain.  She feels the pain on the anterior quadricep as well as the left calf muscle.  The pain is worse with movement and standing.  It is intermittent in nature.  She has a history of three-level lumbar fusion in 2018.  She has a history of right total knee arthroplasty and left total knee arthroplasty that was done in 2014 and 2022.  She feels the pain is different than her previous radicular pain that she had down her right side.  She does take Lyrica intermittently.  Does not take an anti-inflammatory.  Pain has been occurring for over a year.  Does seem to be getting worse in the past few weeks.  No paresthesias. ? ?Review of the note from 2/23 shows the check for a blood clot. ?Independent review of the lower extremity duplex shows a negative DVT study. ? ? ?Review of Systems ?See HPI  ? ?HISTORY: Past Medical, Surgical, Social, and Family History Reviewed & Updated per EMR.   ?Pertinent Historical Findings include: ? ?Past Medical History:  ?Diagnosis Date  ? Arthritis   ? Cataract   ? bilateral cataracts  ? COVID-19   ? GERD (gastroesophageal reflux disease)   ? Microcytic anemia   ? Migraine   ? Scoliosis   ? Thalassemia   ? Thalassemia   ? Ulcerative pancolitis ([place])   ? Colonoscopy with Dr. Hilarie Fredrickson  ? Vitamin D deficiency   ? ? ?Past Surgical History:  ?Procedure Laterality Date  ? ABDOMINAL HYSTERECTOMY    ? partial  ? ANTERIOR LAT LUMBAR FUSION N/A 01/28/2016  ? Procedure: ANTERIOR LATERAL LUMBAR FUSION 2 LEVELS XLIF L2-4;  Surgeon: Melina Schools, MD;  Location: [facility];  Service: Orthopedics;  Laterality: N/A;  ? BACK SURGERY    ? BREAST EXCISIONAL BIOPSY Bilateral   ? benign  ? BREAST REDUCTION SURGERY Bilateral 06/16/2017  ? Procedure: BILATERAL MAMMARY REDUCTION  (BREAST);  Surgeon: Wallace Going, DO;  Location: [facility];  Service: Plastics;  Laterality: Bilateral;  ? BREAST SURGERY Bilateral   ? cysts 66 yrs old  ? COLONOSCOPY  20 + years ago  ? In [place].  ? KNEE SURGERY Left 12-13,11-10  ? meniscal repairs  ? LUMBAR FUSION  01/28/2016  ? ANTERIOR LATERAL LUMBAR FUSION 2 LEVELS XLIF L2-4 (N/A)  ? TOTAL KNEE ARTHROPLASTY Left 01/14/2015  ? Procedure: TOTAL KNEE ARTHROPLASTY;  Surgeon: Paralee Cancel, MD;  Location: [facility];  Service: Orthopedics;  Laterality: Left;  ? TOTAL KNEE ARTHROPLASTY Right 11/13/2020  ? Procedure: TOTAL KNEE ARTHROPLASTY;  Surgeon: Paralee Cancel, MD;  Location: [facility];  Service: Orthopedics;  Laterality: Right;  ? ? ? ?PHYSICAL EXAM:  ?VS: BP 120/82 (BP Location: Left Arm, Patient Position: Sitting)   Ht 5' 2"  (1.575 m)   Wt 205 lb (93 kg)   BMI 37.49 kg/m?  ?Physical Exam ?Gen: NAD, alert, cooperative with exam, well-appearing ?MSK:  ?Neurovascularly intact   ? ? ? ? ?ASSESSMENT & PLAN:  ? ?Left leg pain ?Acute on chronic in nature.  No atrophy appreciated.  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  She has weakness with hip flexion as well as extension when compared to the contralateral  side.  No signs of atrophy. ?-Counseled on home exercise therapy and supportive care. ?-Referral to physical therapy. ?-[place] as needed. ?-Reports her back surgeon is getting an updated MRI.  If unrevealing will consider nerve study in the left leg. ? ? ? ? ?

## 2021-04-23 NOTE — Patient Instructions (Signed)
Nice to meet you ?Please try the exercises  ?Please use the pain medicine as needed  ?Please try physical therapy  ?Please send me a message with the MRI results   ?Please send me a message in MyChart with any questions or updates.  ?Please see me back in 4 weeks.  ? ?--Dr. Raeford Razor ? ?

## 2021-04-28 DIAGNOSIS — M545 Low back pain, unspecified: Secondary | ICD-10-CM | POA: Diagnosis not present

## 2021-04-30 ENCOUNTER — Other Ambulatory Visit (HOSPITAL_BASED_OUTPATIENT_CLINIC_OR_DEPARTMENT_OTHER): Payer: Self-pay

## 2021-04-30 MED ORDER — HYDROCODONE-ACETAMINOPHEN 5-325 MG PO TABS
1.0000 | ORAL_TABLET | Freq: Three times a day (TID) | ORAL | 0 refills | Status: DC | PRN
  Filled 2021-04-30: qty 12, 4d supply, fill #0

## 2021-04-30 NOTE — Addendum Note (Signed)
Addended by: Rosemarie Ax on: 04/30/2021 05:23 PM ? ? Modules accepted: Orders ? ?

## 2021-05-01 ENCOUNTER — Other Ambulatory Visit (HOSPITAL_BASED_OUTPATIENT_CLINIC_OR_DEPARTMENT_OTHER): Payer: Self-pay

## 2021-05-05 DIAGNOSIS — M545 Low back pain, unspecified: Secondary | ICD-10-CM | POA: Diagnosis not present

## 2021-05-19 ENCOUNTER — Other Ambulatory Visit: Payer: Self-pay | Admitting: *Deleted

## 2021-05-19 DIAGNOSIS — R1319 Other dysphagia: Secondary | ICD-10-CM

## 2021-05-19 MED ORDER — PANTOPRAZOLE SODIUM 40 MG PO TBEC: 40.0000 mg | DELAYED_RELEASE_TABLET | Freq: Two times a day (BID) | ORAL | 0 refills | Status: DC

## 2021-05-26 DIAGNOSIS — H25013 Cortical age-related cataract, bilateral: Secondary | ICD-10-CM | POA: Diagnosis not present

## 2021-05-26 DIAGNOSIS — H18413 Arcus senilis, bilateral: Secondary | ICD-10-CM | POA: Diagnosis not present

## 2021-05-26 DIAGNOSIS — H2511 Age-related nuclear cataract, right eye: Secondary | ICD-10-CM | POA: Diagnosis not present

## 2021-05-26 DIAGNOSIS — H25043 Posterior subcapsular polar age-related cataract, bilateral: Secondary | ICD-10-CM | POA: Diagnosis not present

## 2021-05-26 DIAGNOSIS — H2513 Age-related nuclear cataract, bilateral: Secondary | ICD-10-CM | POA: Diagnosis not present

## 2021-06-23 NOTE — Progress Notes (Signed)
Chronic Care Management Pharmacy Note  07/01/2021 Name:  [name] MRN:  [id number] DOB:  02/01/1956  Summary: PharmD FU no changes at this time - continue to rehab from knee surgery.  Subjective: [name] is an 66 y.o. year old female who is a primary patient of Tabori, Aundra Millet, MD.  The CCM team was consulted for assistance with disease management and care coordination needs.    Engaged with patient by telephone for follow up visit in response to provider referral for pharmacy case management and/or care coordination services.   Consent to Services:  The patient was given information about Chronic Care Management services, agreed to services, and gave verbal consent prior to initiation of services.  Please see initial visit note for detailed documentation.   Patient Care Team: Midge Minium, MD as PCP - General Pyrtle, Lajuan Lines, MD as Consulting Physician (Gastroenterology) Suella Broad, MD as Consulting Physician (Physical Medicine and Rehabilitation) Edythe Clarity, [facility] (Pharmacist)   Recent office visits:  04/09/21 [name], MD - Family Medicine - Pain and Swelling of Left lower leg - US venous lower leg and referral to sports medicine placed. Follow up once [place] results received.      Recent consult visits:  12/11/20 [name] - Physical Therapy - No notes available.   12/04/20 [name] - Physical Therapy - No notes available.   12/02/20 [name] - Physical Therapy - No notes available.   11/21/20 [name] - Physical Therapy - No notes available.   11/17/20 [name] - Physical Therapy - No notes available.   11/03/20 [name] - Cataract - No notes available.      Hospital visits: 11/13/20-11/14/20 Medication Reconciliation was completed by comparing discharge summary, patient's EMR and Pharmacy list, and upon discussion with patient.   Admitted to the hospital on 11/13/20 due to Total knee replacement.  Discharge date was 11/14/20. Discharged from [facility]?Medications Started at [facility] Discharge:?? docusate sodium (Colace) methocarbamol (Robaxin) oxyCODONE (Roxicodone) polyethylene glycol (MIRALAX / GLYCOLAX) rivaroxaban   Medication Changes at Hospital Discharge: None noted.    Medications Discontinued at Hospital Discharge: None noted.    Medications that remain the same after Hospital Discharge:??  All other medications will remain the same.    Objective:  Lab Results  Component Value Date   CREATININE 0.72 11/14/2020   CREATININE 0.71 10/31/2020   CREATININE 0.79 09/04/2020    No results found for: HGBA1C Last diabetic Eye exam: No results found for: HMDIABEYEEXA  Last diabetic Foot exam: No results found for: HMDIABFOOTEX      Component Value Date/Time   CHOL 156 09/04/2020 1059   TRIG 68.0 09/04/2020 1059   HDL 58.00 09/04/2020 1059   CHOLHDL 3 09/04/2020 1059   VLDL 13.6 09/04/2020 1059   LDLCALC 84 09/04/2020 1059   LDLCALC 100 (H) 05/13/2017 1424       Latest Ref Rng & Units 10/31/2020    9:38 AM 09/04/2020   10:59 AM 06/16/2020   11:39 AM  Hepatic Function  Total Protein 6.5 - 8.1 g/dL 7.7   7.2   8.1    Albumin 3.5 - 5.0 g/dL 4.0   3.9   4.1    AST 15 - 41 U/L _0 ALT 0 - 44 U/L _1 Alk Phosphatase 38 - 126 U/L 59  70   83    Total Bilirubin 0.3 - 1.2 mg/dL 0.5   0.5   0.6    Bilirubin, Direct 0.0 - 0.3 mg/dL  0.1       Lab Results  Component Value Date/Time   TSH 1.02 09/04/2020 10:59 AM   TSH 0.99 05/13/2017 02:24 PM       Latest Ref Rng & Units 11/14/2020    3:14 AM 10/31/2020    9:38 AM 09/04/2020   10:59 AM  CBC  WBC 4.0 - 10.5 K/uL 12.2   5.5   5.5    Hemoglobin 12.0 - 15.0 g/dL 9.9   11.6   11.6    Hematocrit 36.0 - 46.0 % 32.4   37.3   36.7    Platelets 150 - 400 K/uL 257   301   309.0      Lab Results  Component Value Date/Time   VD25OH 11.80 (L) 04/14/2020 09:46 AM   VD25OH 15  (L) 11/08/2016 04:13 PM   VD25OH 20 (L) 05/09/2015 04:04 PM   VD25OH 18.02 (L) 10/24/2013 09:38 AM    Clinical ASCVD: No  The 10-year ASCVD risk score (Arnett DK, et al., 2019) is: 4.5%   Values used to calculate the score:     Age: 67 years     Sex: Female     Is Non-Hispanic African American: Yes     Diabetic: No     Tobacco smoker: No     Systolic Blood Pressure: 892 mmHg     Is BP treated: No     HDL Cholesterol: 58 mg/dL     Total Cholesterol: 156 mg/dL    Other: (CHADS2VASc if Afib, PHQ9 if depression, MMRC or CAT for COPD, ACT, DEXA)  Social History   Tobacco Use  Smoking Status Never  Smokeless Tobacco Never   BP Readings from Last 3 Encounters:  04/23/21 120/82  04/09/21 120/72  11/14/20 123/64   Pulse Readings from Last 3 Encounters:  04/09/21 81  11/14/20 94  10/31/20 62   Wt Readings from Last 3 Encounters:  04/23/21 205 lb (93 kg)  04/09/21 205 lb 3.2 oz (93.1 kg)  11/13/20 203 lb (92.1 kg)    Assessment: Review of patient past medical history, allergies, medications, health status, including review of consultants reports, laboratory and other test data, was performed as part of comprehensive evaluation and provision of chronic care management services.   SDOH:  (Social Determinants of Health) assessments and interventions performed: Yes   CCM Care Plan  No Known Allergies   Medications Reviewed Today     Reviewed by Edythe Clarity, [facility] (Pharmacist) on 07/01/21 at 1325  Med List Status: <None>   Medication Order Taking? Sig Documenting Provider Last Dose Status Informant  acetaminophen (TYLENOL) 650 MG CR tablet [id number] Yes Take 1,300 mg by mouth every 8 (eight) hours as needed for pain. [provider] Taking Active Self  calcium carbonate (TUMS - DOSED IN MG ELEMENTAL CALCIUM) 500 MG chewable tablet [id number] Yes Chew 1 tablet by mouth daily as needed for indigestion or heartburn. [provider] Taking Active Self   folic acid (FOLVITE) 1 MG tablet [id number] Yes TAKE 1 TABLET(1 MG) BY MOUTH DAILY Pyrtle, Lajuan Lines, MD Taking Active   HYDROcodone-acetaminophen (NORCO/VICODIN) 5-325 MG tablet [id number] Yes Take 1 tablet by mouth every 8 (eight) hours as needed. Rosemarie Ax, MD Taking Active   hydrocortisone cream 1 % [id number] Yes Apply 1 application topically 2 (two)  times daily as needed for itching. [provider] Taking Active Self  methocarbamol (ROBAXIN) 500 MG tablet [id number] Yes Take 1 tablet (500 mg total) by mouth every 6 (six) hours as needed for muscle spasms. Irving Copas, PA-C Taking Active   mupirocin cream (BACTROBAN) 2 % [id number] Yes Apply 1 application topically 2 (two) times daily. Maximiano Coss, NP Taking Active Self  mupirocin ointment (BACTROBAN) 2 % [id number] Yes Apply 1 application topically 2 (two) times daily.  Patient taking differently: Apply 1 application. topically daily as needed (ingrown hair).   Maximiano Coss, NP Taking Active Self  pantoprazole (PROTONIX) 40 MG tablet [id number] Yes Take 1 tablet (40 mg total) by mouth 2 (two) times daily. Jerene Bears, MD Taking Active   sulfaSALAzine (AZULFIDINE) 500 MG tablet [id number] Yes Take 2 tablets (1,000 mg total) by mouth 2 (two) times daily. TAKE 1 TABLET BY MOUTH TWICE DAILY FOR 1 WEEK. INCREASE TO 2 TABLETS BY MOUTH 2 TIMES DAILY THEREAFTER Pyrtle, Lajuan Lines, MD Taking Active Self  SUMAtriptan (IMITREX) 100 MG tablet [id number] Yes Take 1 tablet (100 mg total) by mouth every 2 (two) hours as needed for migraine. 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[place]) 07/25/2012   Physical exam 05/19/2012   THYROIDITIS 01/27/2010   MIGRAINE HEADACHE 01/27/2010   PALPITATIONS 12/31/2009   Immunization History  Administered Date(s) Administered   PFIZER(Purple Top)SARS-COV-2 Vaccination 06/02/2019, 06/26/2019   Tdap 10/24/2013    Conditions to be addressed/monitored: Asthma, osteoarthritis, UC, GERD, migraines   Care Plan : ccm pharmacy care plan  Updates made by Edythe Clarity, RPH since 07/01/2021 12:00 AM     Problem: Asthma, osteoarthritis, UC, GERD, migraines   Priority: High     Long-Range Goal: disease management   Start Date: 10/22/2020  Recent Progress: On track  Priority: High  Note:   Pharmacist Clinical Goal(s):  Patient will verbalize ability to afford treatment regimen contact provider office for questions/concerns as evidenced notation of same in electronic health record through collaboration with PharmD and provider.   Interventions: 1:1 collaboration with Midge Minium, MD regarding development and update of comprehensive plan of care as evidenced by provider attestation and co-signature Inter-disciplinary care team collaboration (see longitudinal plan of care) Comprehensive medication review performed; medication list updated in electronic medical record  asthma (Goal: control symptoms and prevent exacerbations) -Controlled -Current treatment  No current therapies  -Medications previously tried: none on file  -Pulmonary function testing: n/a -Exacerbations requiring treatment in last 6 months: 0 -Patient denies consistent use of maintenance inhaler -Frequency of rescue inhaler use: 0 -Counseled on When to use rescue inhaler -No rx changes   Osteoarthritis of right knee (Goal: ensure safe medication use) -Not ideally controlled -Upcoming total knee arthroscopy (right) 11/13/2020 -Current  treatment  Tylenol 8 Hour 650 mg tablet,extended release as needed -Medications previously tried: diclofenac (stopped due to GI risks)  -Recommended to continue current medication  Update 07/01/21 She is still recovering from  knee surgery last September. Mobility is so so.  She compensates with other leg and she feels this is causing some pain/discomfort in the other knee. Continues to only take Tylenol for the pain. Plan to continue to rehab. Was having some swelling but no evidence of blood clots  - referred to sports medicine for eval.   GERD/UC (minimize symptoms) -Controlled -Sees Dr Hilarie Fredrickson for UC  -Previously taking omeprazole OTC, switched to pantoprazole Rx. Aware of GI triggers and understands to avoid NSAIDs - Tylenol is her first line medication.  -Current regimen Pantoprazole 40 mg twice daily (GERD) Appropriate, Effective, Safe, Accessible Sulfasalazine 500 mg - 2 tabs twice daily (UC) Appropriate, Effective, Safe, Accessible -Reviewed concerns/side effects - no problems noted -No Rx changes  Update 07/01/21 Continues on same medications. Still followed by Dr. Hilarie Fredrickson. NO changes at this time medications still working well and are affordable.   Health Maintenance -Vaccine gaps: covid booster (plans to get after surgery), influenza (does not receive) -Educated on Vitamin D 3 use - agreeable to picking up 1000 units and taking daily -Goal of weight loss following knee surgery - may consider silver sneakers -Counseled on diet and exercise extensively  Patient Goals/Self-Care Activities Patient will:  - take medications as prescribed target a minimum of 150 minutes of moderate intensity exercise weekly  Follow Up Plan: cpp f/u 8 months, hc 6 months  Medication Assistance: None required.  Patient affirms current coverage meets needs. Patient's preferred pharmacy is:  [id number] - [place], [place] - [address] [place] [address] [place] [address] Phone: [phone] Fax: [phone]  Follow Up:  Patient agrees to Care Plan and Follow-up.        Beverly Milch, PharmD Clinical Pharmacist  [facility] [phone]

## 2021-07-01 ENCOUNTER — Ambulatory Visit: Payer: Medicare Other | Admitting: Pharmacist

## 2021-07-01 DIAGNOSIS — K51011 Ulcerative (chronic) pancolitis with rectal bleeding: Secondary | ICD-10-CM

## 2021-07-01 DIAGNOSIS — M1711 Unilateral primary osteoarthritis, right knee: Secondary | ICD-10-CM

## 2021-07-01 NOTE — Patient Instructions (Addendum)
Visit Information ? ? Goals Addressed   ? ?  ?  ?  ?  ? This Visit's Progress  ?  Manage Pain-Osteoarthritis     ?  Timeframe:  Long-Range Goal ?Priority:  High ?Start Date: 07/01/21                            ?Expected End Date: 01/01/22                     ? ?Follow Up Date 10/01/21  ?  ?- develop a personal pain management plan ?- keep track of prescription refills ?- plan exercise or activity when pain is best controlled  ?  ?Why is this important?   ?Day-to-day life can be hard when you have joint pain.  ?Pain medicine is just one piece of the treatment puzzle. There are many things you can do to manage pain and stay strong.   ?Lifestyle changes like stopping smoking and eating foods with Vitamin D and calcium keeps your bones and muscles healthy. Your joints are better when supported by strong muscles.  ?You can try these action steps to help you manage your pain.   ?  ?Notes:  ?  ? ?  ? ?Patient Care Plan: ccm pharmacy care plan  ?  ? ?Problem Identified: Asthma, osteoarthritis, UC, GERD, migraines   ?Priority: High  ?  ? ?Long-Range Goal: disease management   ?Start Date: 10/22/2020  ?Recent Progress: On track  ?Priority: High  ?Note:   ?Pharmacist Clinical Goal(s):  ?Patient will verbalize ability to afford treatment regimen ?contact provider office for questions/concerns as evidenced notation of same in electronic health record through collaboration with PharmD and provider.  ? ?Interventions: ?1:1 collaboration with Midge Minium, MD regarding development and update of comprehensive plan of care as evidenced by provider attestation and co-signature ?Inter-disciplinary care team collaboration (see longitudinal plan of care) ?Comprehensive medication review performed; medication list updated in electronic medical record ? ?asthma (Goal: control symptoms and prevent exacerbations) ?-Controlled ?-Current treatment  ?No current therapies  ?-Medications previously tried: none on file  ?-Pulmonary function  testing: n/a ?-Exacerbations requiring treatment in last 6 months: 0 ?-Patient denies consistent use of maintenance inhaler ?-Frequency of rescue inhaler use: 0 ?-Counseled on When to use rescue inhaler ?-No rx changes  ? ?Osteoarthritis of right knee (Goal: ensure safe medication use) ?-Not ideally controlled ?-Upcoming total knee arthroscopy (right) 11/13/2020 ?-Current treatment  ?Tylenol 8 Hour 650 mg tablet,extended release as needed ?-Medications previously tried: diclofenac (stopped due to GI risks)  ?-Recommended to continue current medication ? ?Update 07/01/21 ?She is still recovering from knee surgery last September. ?Mobility is so so.  She compensates with other leg and she feels this is causing some pain/discomfort in the other knee. ?Continues to only take Tylenol for the pain. ?Plan to continue to rehab. ?Was having some swelling but no evidence of blood clots  - referred to sports medicine for eval. ? ? ?GERD/UC (minimize symptoms) ?-Controlled ?-Sees Dr Hilarie Fredrickson for UC  ?-Previously taking omeprazole OTC, switched to pantoprazole Rx. Aware of GI triggers and understands to avoid NSAIDs - Tylenol is her first line medication.  ?-Current regimen ?Pantoprazole 40 mg twice daily (GERD) Appropriate, Effective, Safe, Accessible ?Sulfasalazine 500 mg - 2 tabs twice daily (UC) Appropriate, Effective, Safe, Accessible ?-Reviewed concerns/side effects - no problems noted ?-No Rx changes ? ?Update 07/01/21 ?Continues on same medications. ?Still followed by  Dr. Hilarie Fredrickson. ?NO changes at this time medications still working well and are affordable. ?  ?Health Maintenance ?-Vaccine gaps: covid booster (plans to get after surgery), influenza (does not receive) ?-Educated on Vitamin D 3 use - agreeable to picking up 1000 units and taking daily ?-Goal of weight loss following knee surgery - may consider silver sneakers ?-Counseled on diet and exercise extensively ? ?Patient Goals/Self-Care Activities ?Patient will:  ?-  take medications as prescribed ?target a minimum of 150 minutes of moderate intensity exercise weekly ? ?Follow Up Plan: cpp f/u 8 months, hc 6 months  ?Medication Assistance: None required.  Patient affirms current coverage meets needs. ?Patient's preferred pharmacy is: ? ?[id number] - [place], [place] - [address] ?[address] ?[place] [address] ?Phone: [phone] Fax: [phone] ? ?Follow Up:  Patient agrees to Care Plan and Follow-up. ?  ? ?  ?  ? ?The patient verbalized understanding of instructions, educational materials, and care plan provided today and DECLINED offer to receive copy of patient instructions, educational materials, and care plan.  ?Telephone follow up appointment with pharmacy team member scheduled for: 1 year ? ?Edythe Clarity, [facility]  ?Beverly Milch, PharmD ?Clinical Pharmacist  ?[facility] ?([phone] ? ?

## 2021-07-16 DIAGNOSIS — M5416 Radiculopathy, lumbar region: Secondary | ICD-10-CM | POA: Diagnosis not present

## 2021-07-29 DIAGNOSIS — H2511 Age-related nuclear cataract, right eye: Secondary | ICD-10-CM | POA: Diagnosis not present

## 2021-07-30 DIAGNOSIS — H2512 Age-related nuclear cataract, left eye: Secondary | ICD-10-CM | POA: Diagnosis not present

## 2021-08-13 ENCOUNTER — Ambulatory Visit: Payer: Medicare Other

## 2021-08-15 HISTORY — PX: CATARACT EXTRACTION: SUR2

## 2021-08-17 ENCOUNTER — Other Ambulatory Visit: Payer: Self-pay | Admitting: Internal Medicine

## 2021-08-20 ENCOUNTER — Ambulatory Visit (INDEPENDENT_AMBULATORY_CARE_PROVIDER_SITE_OTHER): Payer: Medicare Other

## 2021-08-20 DIAGNOSIS — Z0000 Encounter for general adult medical examination without abnormal findings: Secondary | ICD-10-CM

## 2021-08-20 NOTE — Progress Notes (Signed)
Subjective:   [name] is a 66 y.o. female who presents for an Initial Medicare Annual Wellness Visit.   I connected with Asiah Browder today by telephone and verified that I am speaking with the correct person using two identifiers. Location patient: home Location provider: work Persons participating in the virtual visit: patient, provider.   I discussed the limitations, risks, security and privacy concerns of performing an evaluation and management service by telephone and the availability of in person appointments. I also discussed with the patient that there may be a patient responsible charge related to this service. The patient expressed understanding and verbally consented to this telephonic visit.    Interactive audio and video telecommunications were attempted between this provider and patient, however failed, due to patient having technical difficulties OR patient did not have access to video capability.  We continued and completed visit with audio only.    Review of Systems     Cardiac Risk Factors include: advanced age (>44mn, >>64women)     Objective:    Today's Vitals   There is no height or weight on file to calculate BMI.     08/20/2021    3:36 PM 11/13/2020    5:00 PM 10/31/2020    9:09 AM 06/16/2017   10:13 AM 05/30/2017    3:42 PM 04/03/2016   10:38 AM 01/28/2016    3:57 PM  Advanced Directives  Does Patient Have a Medical Advance Directive? No No No No No No No  Would patient like information on creating a medical advance directive? No - Patient declined Yes (MAU/Ambulatory/Procedural Areas - Information given) Yes (MAU/Ambulatory/Procedural Areas - Information given) No - Patient declined Yes (MAU/Ambulatory/Procedural Areas - Information given)  No - Patient declined    Current Medications (verified) Outpatient Encounter Medications as of 08/20/2021  Medication Sig   acetaminophen (TYLENOL) 650 MG CR tablet Take 1,300 mg by mouth every 8 (eight) hours  as needed for pain.   calcium carbonate (TUMS - DOSED IN MG ELEMENTAL CALCIUM) 500 MG chewable tablet Chew 1 tablet by mouth daily as needed for indigestion or heartburn.   folic acid (FOLVITE) 1 MG tablet TAKE 1 TABLET(1 MG) BY MOUTH DAILY   hydrocortisone cream 1 % Apply 1 application topically 2 (two) times daily as needed for itching.   methocarbamol (ROBAXIN) 500 MG tablet Take 1 tablet (500 mg total) by mouth every 6 (six) hours as needed for muscle spasms.   pantoprazole (PROTONIX) 40 MG tablet Take 1 tablet (40 mg total) by mouth 2 (two) times daily.   sulfaSALAzine (AZULFIDINE) 500 MG tablet Take 2 tablets (1,000 mg total) by mouth 2 (two) times daily. TAKE 1 TABLET BY MOUTH TWICE DAILY FOR 1 WEEK. INCREASE TO 2 TABLETS BY MOUTH 2 TIMES DAILY THEREAFTER   SUMAtriptan (IMITREX) 100 MG tablet Take 1 tablet (100 mg total) by mouth every 2 (two) hours as needed for migraine.   HYDROcodone-acetaminophen (NORCO/VICODIN) 5-325 MG tablet Take 1 tablet by mouth every 8 (eight) hours as needed.   mupirocin cream (BACTROBAN) 2 % Apply 1 application topically 2 (two) times daily.   mupirocin ointment (BACTROBAN) 2 % Apply 1 application topically 2 (two) times daily. (Patient taking differently: Apply 1 application  topically daily as needed (ingrown hair).)   No facility-administered encounter medications on file as of 08/20/2021.    Allergies (verified) Patient has no known allergies.   History: Past Medical History:  Diagnosis Date   Arthritis    Cataract  bilateral cataracts   COVID-19    GERD (gastroesophageal reflux disease)    Microcytic anemia    Migraine    Scoliosis    Thalassemia    Thalassemia    Ulcerative pancolitis (HCC)    Colonoscopy with Dr. Hilarie Fredrickson   Vitamin D deficiency    Past Surgical History:  Procedure Laterality Date   ABDOMINAL HYSTERECTOMY     partial   ANTERIOR LAT LUMBAR FUSION N/A 01/28/2016   Procedure: ANTERIOR LATERAL LUMBAR FUSION 2 LEVELS XLIF  L2-4;  Surgeon: Melina Schools, MD;  Location: [place];  Service: Orthopedics;  Laterality: N/A;   BACK SURGERY     BREAST EXCISIONAL BIOPSY Bilateral    benign   BREAST REDUCTION SURGERY Bilateral 06/16/2017   Procedure: BILATERAL MAMMARY REDUCTION  (BREAST);  Surgeon: Wallace Going, DO;  Location: [facility];  Service: Plastics;  Laterality: Bilateral;   BREAST SURGERY Bilateral    cysts 66 yrs old   COLONOSCOPY  20 + years ago   In [place]   KNEE SURGERY Left 12-13,11-10   meniscal repairs   LUMBAR FUSION  01/28/2016   ANTERIOR LATERAL LUMBAR FUSION 2 LEVELS XLIF L2-4 (N/A)   TOTAL KNEE ARTHROPLASTY Left 01/14/2015   Procedure: TOTAL KNEE ARTHROPLASTY;  Surgeon: Paralee Cancel, MD;  Location: [facility];  Service: Orthopedics;  Laterality: Left;   TOTAL KNEE ARTHROPLASTY Right 11/13/2020   Procedure: TOTAL KNEE ARTHROPLASTY;  Surgeon: Paralee Cancel, MD;  Location: [facility];  Service: Orthopedics;  Laterality: Right;   Family History  Problem Relation Age of Onset   Sickle cell trait Mother    Diabetes Brother    Diabetes Daughter    Asthma Other    Colon cancer Neg Hx    Esophageal cancer Neg Hx    Rectal cancer Neg Hx    Stomach cancer Neg Hx    Social History   Socioeconomic History   Marital status: Divorced    Spouse name: Not on file   Number of children: 3   Years of education: college   Highest education level: Not on file  Occupational History    Employer: [employer]    Comment: Computers  Tobacco Use   Smoking status: Never   Smokeless tobacco: Never  Vaping Use   Vaping Use: Never used  Substance and Sexual Activity   Alcohol use: No    Alcohol/week: 0.0 standard drinks of alcohol   Drug use: No   Sexual activity: Not on file  Other Topics Concern   Not on file  Social History Narrative   Patient lives at home alone and she is divorced. Patient works with computers. Patient has a college education.   Right handed.    Caffeine.- one cup daily.     Social Determinants of Health   Financial Resource Strain: Low Risk  (08/20/2021)   Overall Financial Resource Strain (CARDIA)    Difficulty of Paying Living Expenses: Not hard at all  Food Insecurity: No Food Insecurity (08/20/2021)   Hunger Vital Sign    Worried About Running Out of Food in the Last Year: Never true    Ran Out of Food in the Last Year: Never true  Transportation Needs: No Transportation Needs (08/20/2021)   PRAPARE - Hydrologist (Medical): No    Lack of Transportation (Non-Medical): No  Physical Activity: Inactive (08/20/2021)   Exercise Vital Sign    Days of Exercise per Week: 0 days  Minutes of Exercise per Session: 0 min  Stress: No Stress Concern Present (08/20/2021)   [place]    Feeling of Stress : Not at all  Social Connections: Moderately Isolated (08/20/2021)   Social Connection and Isolation Panel [NHANES]    Frequency of Communication with Friends and Family: Three times a week    Frequency of Social Gatherings with Friends and Family: Three times a week    Attends Religious Services: More than 4 times per year    Active Member of Clubs or Organizations: No    Attends Archivist Meetings: Never    Marital Status: Divorced    Tobacco Counseling Counseling given: Not Answered   Clinical Intake:  Pre-visit preparation completed: Yes  Pain : No/denies pain     Nutritional Risks: None Diabetes: No  How often do you need to have someone help you when you read instructions, pamphlets, or other written materials from your doctor or pharmacy?: 1 - Never What is the last grade level you completed in school?: college  Diabetic?no   Interpreter Needed?: No  Information entered by :: [place] of Daily Living    08/20/2021    3:41 PM 11/13/2020    5:00 PM  In your present state of health, do you have  any difficulty performing the following activities:  Hearing? 0 0  Vision? 0 0  Difficulty concentrating or making decisions? 0 0  Walking or climbing stairs? 0 0  Dressing or bathing? 0 0  Doing errands, shopping? 0 0  Preparing Food and eating ? N   Using the Toilet? N   In the past six months, have you accidently leaked urine? N   Do you have problems with loss of bowel control? N   Managing your Medications? N   Managing your Finances? N   Housekeeping or managing your Housekeeping? N     Patient Care Team: Midge Minium, MD as PCP - General Pyrtle, Lajuan Lines, MD as Consulting Physician (Gastroenterology) Suella Broad, MD as Consulting Physician (Physical Medicine and Rehabilitation) Edythe Clarity, [facility] (Pharmacist)  Indicate any recent Medical Services you may have received from other than [facility] providers in the past year (date may be approximate).     Assessment:   This is a routine wellness examination for Alexya.  Hearing/Vision screen Vision Screening - Comments:: Annual eye exams   Dietary issues and exercise activities discussed: Current Exercise Habits: The patient does not participate in regular exercise at present, Exercise limited by: orthopedic condition(s)   Goals Addressed             This Visit's Progress    Manage Pain-Osteoarthritis   On track    Timeframe:  Long-Range Goal Priority:  High Start Date: 07/01/21                            Expected End Date: 01/01/22                      Follow Up Date 10/01/21    - develop a personal pain management plan - keep track of prescription refills - plan exercise or activity when pain is best controlled    Why is this important?   Day-to-day life can be hard when you have joint pain.  Pain medicine is just one piece of the treatment puzzle. There are many things you  can do to manage pain and stay strong.   Lifestyle changes like stopping smoking and eating foods with Vitamin D and calcium  keeps your bones and muscles healthy. Your joints are better when supported by strong muscles.  You can try these action steps to help you manage your pain.     Notes:        Depression Screen    08/20/2021    3:37 PM 08/20/2021    3:34 PM 04/09/2021    2:00 PM 09/02/2020   11:07 AM 06/13/2020    1:05 PM 04/11/2020   12:38 PM 02/26/2020    3:02 PM  PHQ 2/9 Scores  PHQ - 2 Score 0 0 0 0 0 0 0  PHQ- 9 Score   9 0 0 0     Fall Risk    08/20/2021    3:37 PM 04/09/2021    2:00 PM 09/11/2020   11:50 AM 09/02/2020   11:05 AM 06/13/2020    1:05 PM  Fall Risk   Falls in the past year? 0 0 0 0 0  Number falls in past yr: 0  0 0 0  Injury with Fall? 0  0 0 0  Risk for fall due to :  No Fall Risks No Fall Risks No Fall Risks No Fall Risks  Follow up Falls evaluation completed Falls evaluation completed Falls evaluation completed    Comment cane        FALL RISK PREVENTION PERTAINING TO THE HOME:  Any stairs in or around the home? No  If so, are there any without handrails? No  Home free of loose throw rugs in walkways, pet beds, electrical cords, etc? Yes  Adequate lighting in your home to reduce risk of falls? Yes   ASSISTIVE DEVICES UTILIZED TO PREVENT FALLS:  Life alert? No  Use of a cane, walker or w/c? Yes  Grab bars in the bathroom? Yes  Shower chair or bench in shower? No  Elevated toilet seat or a handicapped toilet? No     Cognitive Function:    Normal cognitive status assessed by telephone conversation  by this Nurse Health Advisor. No abnormalities found.      Immunizations Immunization History  Administered Date(s) Administered   PFIZER(Purple Top)SARS-COV-2 Vaccination 06/02/2019, 06/26/2019   Tdap 10/24/2013    TDAP status: Up to date  Flu Vaccine status: Up to date  Pneumococcal vaccine status: Due, Education has been provided regarding the importance of this vaccine. Advised may receive this vaccine at local pharmacy or Health Dept. Aware to provide a  copy of the vaccination record if obtained from local pharmacy or Health Dept. Verbalized acceptance and understanding.   Covid-19 vaccine status: Completed vaccines  Qualifies for Shingles Vaccine? Yes   Zostavax completed No   Shingrix Completed?: No.    Education has been provided regarding the importance of this vaccine. Patient has been advised to call insurance company to determine out of pocket expense if they have not yet received this vaccine. Advised may also receive vaccine at local pharmacy or Health Dept. Verbalized acceptance and understanding.  Screening Tests Health Maintenance  Topic Date Due   HIV Screening  Never done   Hepatitis C Screening  Never done   Zoster Vaccines- Shingrix (1 of 2) Never done   COVID-19 Vaccine (3 - Pfizer series) 08/21/2019   MAMMOGRAM  07/17/2021   COLONOSCOPY (Pts 45-67yr Insurance coverage will need to be confirmed)  06/21/2023   TETANUS/TDAP  10/25/2023  HPV VACCINES  Aged Out   INFLUENZA VACCINE  Discontinued    Health Maintenance  Health Maintenance Due  Topic Date Due   HIV Screening  Never done   Hepatitis C Screening  Never done   Zoster Vaccines- Shingrix (1 of 2) Never done   COVID-19 Vaccine (3 - Pfizer series) 08/21/2019   MAMMOGRAM  07/17/2021    Colorectal cancer screening: Type of screening: Colonoscopy. Completed 06/20/2020. Repeat every 3 years  Mammogram status: Ordered patient will schedule . Pt provided with contact info and advised to call to schedule appt.   Bone Density status: Ordered not of age . Pt provided with contact info and advised to call to schedule appt.  Lung Cancer Screening: (Low Dose CT Chest recommended if Age 15-80 years, 30 pack-year currently smoking OR have quit w/in 15years.) does not qualify.   Lung Cancer Screening Referral: n/a  Additional Screening:  Hepatitis C Screening: does not qualify;  Vision Screening: Recommended annual ophthalmology exams for early detection of  glaucoma and other disorders of the eye. Is the patient up to date with their annual eye exam?  Yes  Who is the provider or what is the name of the office in which the patient attends annual eye exams? Dr.Allen  If pt is not established with a provider, would they like to be referred to a provider to establish care? No .   Dental Screening: Recommended annual dental exams for proper oral hygiene  Community Resource Referral / Chronic Care Management: CRR required this visit?  No   CCM required this visit?  No      Plan:     I have personally reviewed and noted the following in the patient's chart:   Medical and social history Use of alcohol, tobacco or illicit drugs  Current medications and supplements including opioid prescriptions. Patient is not currently taking opioid prescriptions. Functional ability and status Nutritional status Physical activity Advanced directives List of other physicians Hospitalizations, surgeries, and ER visits in previous 12 months Vitals Screenings to include cognitive, depression, and falls Referrals and appointments  In addition, I have reviewed and discussed with patient certain preventive protocols, quality metrics, and best practice recommendations. A written personalized care plan for preventive services as well as general preventive health recommendations were provided to patient.     Randel Pigg, LPN   03/26/1914   Nurse Notes: none

## 2021-08-26 DIAGNOSIS — H2512 Age-related nuclear cataract, left eye: Secondary | ICD-10-CM | POA: Diagnosis not present

## 2021-09-21 ENCOUNTER — Encounter: Payer: Self-pay | Admitting: Family Medicine

## 2021-09-21 ENCOUNTER — Ambulatory Visit (INDEPENDENT_AMBULATORY_CARE_PROVIDER_SITE_OTHER): Payer: Medicare Other | Admitting: Family Medicine

## 2021-09-21 VITALS — BP 122/86 | HR 73 | Temp 97.4°F | Resp 16 | Ht 62.0 in | Wt 206.5 lb

## 2021-09-21 DIAGNOSIS — L720 Epidermal cyst: Secondary | ICD-10-CM

## 2021-09-21 NOTE — Progress Notes (Signed)
   Subjective:    Patient ID: [name], female    DOB: December 26, 1955, 66 y.o.   MRN: [id number]  HPI Cyst- pt reports area on back is large and 'it needs to be drained again'.  No pain.  Will have occasional drainage.   Review of Systems For ROS see HPI     Objective:   Physical Exam Vitals reviewed.  Constitutional:      General: She is not in acute distress.    Appearance: Normal appearance. She is obese. She is not ill-appearing.  Skin:    General: Skin is warm and dry.     Comments: 1" diameter epidermoid cyst in center of upper back w/ punctate central pore and smaller lateral pore, no redness, no drainage, no TTP  Neurological:     Mental Status: She is alert.           Assessment & Plan:  Epidermoid cyst- no evidence of infxn.  Rather than I&D in the office will refer to Surgery for complete excision/removal.  Pt expressed understanding and is in agreement w/ plan.

## 2021-09-21 NOTE — Patient Instructions (Signed)
Follow up as needed or as scheduled We'll call you to schedule your surgery appt Try not to pick or squeeze so it doesn't become infected Call with any questions or concerns Hang in there! ENJOY THE [place]!!!

## 2021-10-08 DIAGNOSIS — D171 Benign lipomatous neoplasm of skin and subcutaneous tissue of trunk: Secondary | ICD-10-CM | POA: Diagnosis not present

## 2021-11-06 DIAGNOSIS — G894 Chronic pain syndrome: Secondary | ICD-10-CM | POA: Diagnosis not present

## 2021-11-06 DIAGNOSIS — M961 Postlaminectomy syndrome, not elsewhere classified: Secondary | ICD-10-CM | POA: Diagnosis not present

## 2021-11-06 DIAGNOSIS — M5416 Radiculopathy, lumbar region: Secondary | ICD-10-CM | POA: Diagnosis not present

## 2021-11-29 DIAGNOSIS — M546 Pain in thoracic spine: Secondary | ICD-10-CM | POA: Diagnosis not present

## 2021-12-04 ENCOUNTER — Telehealth: Payer: Self-pay | Admitting: Internal Medicine

## 2021-12-04 ENCOUNTER — Ambulatory Visit (HOSPITAL_COMMUNITY): Admission: RE | Admit: 2021-12-04 | Payer: Medicare Other | Source: Home / Self Care | Admitting: Surgery

## 2021-12-04 ENCOUNTER — Encounter (HOSPITAL_COMMUNITY): Admission: RE | Payer: Self-pay | Source: Home / Self Care

## 2021-12-04 DIAGNOSIS — R1319 Other dysphagia: Secondary | ICD-10-CM

## 2021-12-04 SURGERY — EXCISION LIPOMA
Anesthesia: General | Site: Back

## 2021-12-04 MED ORDER — PANTOPRAZOLE SODIUM 40 MG PO TBEC: 40.0000 mg | DELAYED_RELEASE_TABLET | Freq: Two times a day (BID) | ORAL | 0 refills | Status: DC

## 2021-12-04 NOTE — Telephone Encounter (Signed)
Patient called requesting a refill on Pantoprazole

## 2021-12-07 NOTE — Telephone Encounter (Signed)
Rx was sent on 12/04/21. She needs office visit for further refills.  Outpatient Medication Detail   Disp Refills Start End   pantoprazole (PROTONIX) 40 MG tablet 60 tablet 0 12/04/2021    Sig - Route: Take 1 tablet (40 mg total) by mouth 2 (two) times daily. NEEDS OFFICE VISIT FOR FURTHER REFILLS - Oral   Sent to pharmacy as: pantoprazole (PROTONIX) 40 MG tablet   E-Prescribing Status: Receipt confirmed by pharmacy (12/04/2021 10:59 AM EDT)     I have spoken to patient to advise her of this information. She has scheduled a follow up with Dr Hilarie Fredrickson on 02/09/22.

## 2021-12-07 NOTE — Telephone Encounter (Signed)
Patient called to follow up on refill request.

## 2021-12-09 DIAGNOSIS — M5416 Radiculopathy, lumbar region: Secondary | ICD-10-CM | POA: Diagnosis not present

## 2021-12-10 ENCOUNTER — Telehealth: Payer: Self-pay | Admitting: Family Medicine

## 2021-12-10 NOTE — Telephone Encounter (Signed)
Placed form in DR [place] to be signed folder once signed I will fax back to [facility]

## 2021-12-10 NOTE — Telephone Encounter (Signed)
Pt dropped off [facility] form to be filled out and fax to [phone]. Placed in front bin.

## 2021-12-15 NOTE — Telephone Encounter (Signed)
Scheduled

## 2021-12-15 NOTE — Telephone Encounter (Signed)
Pt needs to schedule surgical clearance appt

## 2021-12-17 ENCOUNTER — Encounter: Payer: Self-pay | Admitting: Family Medicine

## 2021-12-17 ENCOUNTER — Ambulatory Visit (INDEPENDENT_AMBULATORY_CARE_PROVIDER_SITE_OTHER): Payer: Medicare Other | Admitting: Family Medicine

## 2021-12-17 ENCOUNTER — Telehealth: Payer: Self-pay | Admitting: Family Medicine

## 2021-12-17 VITALS — BP 128/78 | HR 66 | Temp 98.4°F | Ht 62.0 in | Wt 205.6 lb

## 2021-12-17 DIAGNOSIS — Z01818 Encounter for other preprocedural examination: Secondary | ICD-10-CM

## 2021-12-17 LAB — CBC WITH DIFFERENTIAL/PLATELET
Basophils Absolute: 0.1 10*3/uL (ref 0.0–0.1)
Basophils Relative: 1.3 % (ref 0.0–3.0)
Eosinophils Absolute: 0.3 10*3/uL (ref 0.0–0.7)
Eosinophils Relative: 5.4 % — ABNORMAL HIGH (ref 0.0–5.0)
HCT: 40.3 % (ref 36.0–46.0)
Hemoglobin: 12.8 g/dL (ref 12.0–15.0)
Lymphocytes Relative: 35.6 % (ref 12.0–46.0)
Lymphs Abs: 2 10*3/uL (ref 0.7–4.0)
MCHC: 31.6 g/dL (ref 30.0–36.0)
MCV: 70.8 fl — ABNORMAL LOW (ref 78.0–100.0)
Monocytes Absolute: 0.3 10*3/uL (ref 0.1–1.0)
Monocytes Relative: 5 % (ref 3.0–12.0)
Neutro Abs: 3 10*3/uL (ref 1.4–7.7)
Neutrophils Relative %: 52.7 % (ref 43.0–77.0)
Platelets: 309 10*3/uL (ref 150.0–400.0)
RBC: 5.69 Mil/uL — ABNORMAL HIGH (ref 3.87–5.11)
RDW: 15.5 % (ref 11.5–15.5)
WBC: 5.6 10*3/uL (ref 4.0–10.5)

## 2021-12-17 LAB — HEPATIC FUNCTION PANEL
ALT: 14 U/L (ref 0–35)
AST: 22 U/L (ref 0–37)
Albumin: 4.4 g/dL (ref 3.5–5.2)
Alkaline Phosphatase: 81 U/L (ref 39–117)
Bilirubin, Direct: 0.1 mg/dL (ref 0.0–0.3)
Total Bilirubin: 0.6 mg/dL (ref 0.2–1.2)
Total Protein: 8.4 g/dL — ABNORMAL HIGH (ref 6.0–8.3)

## 2021-12-17 LAB — BASIC METABOLIC PANEL
BUN: 11 mg/dL (ref 6–23)
CO2: 30 mEq/L (ref 19–32)
Calcium: 10 mg/dL (ref 8.4–10.5)
Chloride: 103 mEq/L (ref 96–112)
Creatinine, Ser: 0.62 mg/dL (ref 0.40–1.20)
GFR: 94.37 mL/min (ref >60.00)
Glucose, Bld: 77 mg/dL (ref 70–99)
Potassium: 4.8 mEq/L (ref 3.5–5.1)
Sodium: 141 mEq/L (ref 135–145)

## 2021-12-17 LAB — LIPID PANEL
Cholesterol: 192 mg/dL (ref 0–200)
HDL: 73.2 mg/dL (ref >39.00)
LDL Cholesterol: 104 mg/dL — ABNORMAL HIGH (ref 0–99)
NonHDL: 118.47
Total CHOL/HDL Ratio: 3
Triglycerides: 72 mg/dL (ref 0.0–149.0)
VLDL: 14.4 mg/dL (ref 0.0–40.0)

## 2021-12-17 LAB — TSH: TSH: 1.06 u[IU]/mL (ref 0.35–5.50)

## 2021-12-17 NOTE — Progress Notes (Signed)
Subjective:    [name] is a 66 y.o. female who presents to the office today for a preoperative consultation at the request of surgeon Dr Rolena Infante who plans on performing spinal cord stimulator on  TBD . This consultation is requested for the specific conditions prompting preoperative evaluation (i.e. because of potential affect on operative risk): obesity. Planned anesthesia:  unknown/general . The patient has the following known anesthesia issues:  no hx of problems . Patients bleeding risk: no recent abnormal bleeding, no remote history of abnormal bleeding, and no use of Ca-channel blockers. Patient does not have objections to receiving blood products if needed.  The following portions of the patient's history were reviewed and updated as appropriate: allergies, current medications, past family history, past medical history, past social history, past surgical history, and problem list.  Review of Systems A comprehensive review of systems was negative.    Objective:    BP 128/78   Pulse 66   Temp 98.4 F (36.9 C)   Ht 5' 2"  (1.575 m)   Wt 205 lb 9.6 oz (93.3 kg)   SpO2 98%   BMI 37.60 kg/m   General Appearance:    Alert, cooperative, no distress, appears stated age  Head:    Normocephalic, without obvious abnormality, atraumatic  Eyes:    PERRL, conjunctiva/corneas clear, EOM's intact, fundi    benign, both eyes  Ears:    Normal TM's and external ear canals, both ears  Nose:   Nares normal, septum midline, mucosa normal, no drainage    or sinus tenderness  Throat:   Lips, mucosa, and tongue normal; teeth and gums normal  Neck:   Supple, symmetrical, trachea midline, no adenopathy;    thyroid:  no enlargement/tenderness/nodules; no carotid   bruit or JVD  Back:     Symmetric, no curvature, ROM normal, no CVA tenderness  Lungs:     Clear to auscultation bilaterally, respirations unlabored  Chest Wall:    No tenderness or deformity   Heart:    Regular rate and rhythm, S1 and S2  normal, no murmur, rub   or gallop  Breast Exam:    No tenderness, masses, or nipple abnormality  Abdomen:     Soft, non-tender, bowel sounds active all four quadrants,    no masses, no organomegaly  Genitalia:    Normal female without lesion, discharge or tenderness  Rectal:    Normal tone, normal prostate, no masses or tenderness;   guaiac negative stool  Extremities:   Extremities normal, atraumatic, no cyanosis or edema  Pulses:   2+ and symmetric all extremities  Skin:   Skin color, texture, turgor normal, no rashes or lesions  Lymph nodes:   Cervical, supraclavicular, and axillary nodes normal  Neurologic:   CNII-XII intact, normal strength, sensation and reflexes    throughout    Predictors of intubation difficulty:  Morbid obesity? no  Anatomically abnormal facies? no  Prominent incisors? no  Receding mandible? no  Short, thick neck? no  Neck range of motion: normal  Dentition: No chipped, loose, or missing teeth.  Cardiographics ECG: normal sinus rhythm, no blocks or conduction defects, no ischemic changes Echocardiogram: not done  Imaging Chest x-ray:  NA    Lab Review  Ordered, pending    Assessment:      66 y.o. female with planned surgery as above.   Known risk factors for perioperative complications: None   Difficulty with intubation is not anticipated.  Cardiac Risk Estimation: low  Current  medications which may produce withdrawal symptoms if withheld perioperatively: none    Plan:    1. Preoperative workup as follows ECG, hemoglobin, hematocrit, electrolytes, creatinine, glucose, liver function studies. 2. Change in medication regimen before surgery: none, continue medication regimen including morning of surgery, with sip of water. 3. Prophylaxis for cardiac events with perioperative beta-blockers: not indicated. 4. Invasive hemodynamic monitoring perioperatively: at the discretion of anesthesiologist. 5. Deep vein thrombosis prophylaxis  postoperatively:regimen to be chosen by surgical team. 6. Surveillance for postoperative MI with ECG immediately postoperatively and on postoperative days 1 and 2 AND troponin levels 24 hours postoperatively and on day 4 or hospital discharge (whichever comes first): at the discretion of anesthesiologist. 7. Other measures:  none

## 2021-12-17 NOTE — Telephone Encounter (Signed)
Received AWV form from pt. Placed in front bin

## 2021-12-17 NOTE — Patient Instructions (Signed)
Schedule your complete physical in 1 year We'll notify you of your lab results and make any changes if needed Continue to work on healthy diet and exercise as you are able GOOD LUCK WITH SURGERY!!! (Happy [place] Birthday!)

## 2021-12-18 ENCOUNTER — Telehealth: Payer: Self-pay

## 2021-12-18 NOTE — Telephone Encounter (Signed)
Left pt a VM stating I faxed her surgical clearance back to [facility] and placed the form in the scan pile in cabinet

## 2021-12-18 NOTE — Progress Notes (Signed)
Informed pt of lab results

## 2021-12-22 ENCOUNTER — Ambulatory Visit (HOSPITAL_COMMUNITY): Payer: Self-pay | Admitting: Orthopedic Surgery

## 2021-12-28 DIAGNOSIS — M25561 Pain in right knee: Secondary | ICD-10-CM | POA: Diagnosis not present

## 2021-12-28 DIAGNOSIS — M25562 Pain in left knee: Secondary | ICD-10-CM | POA: Diagnosis not present

## 2021-12-28 DIAGNOSIS — Z96653 Presence of artificial knee joint, bilateral: Secondary | ICD-10-CM | POA: Diagnosis not present

## 2021-12-28 DIAGNOSIS — G5781 Other specified mononeuropathies of right lower limb: Secondary | ICD-10-CM | POA: Diagnosis not present

## 2021-12-31 ENCOUNTER — Other Ambulatory Visit: Payer: Self-pay | Admitting: Internal Medicine

## 2021-12-31 DIAGNOSIS — R1319 Other dysphagia: Secondary | ICD-10-CM

## 2022-01-01 NOTE — Progress Notes (Signed)
Surgical Instructions    Your procedure is scheduled on Monday, 01/11/22.  Report to [facility] Main Entrance "A" at 9:30 A.M., then check in with the Admitting office.  Call this number if you have problems the morning of surgery:  [phone]   If you have any questions prior to your surgery date call [phone]: Open Monday-Friday 8am-4pm If you experience any cold or flu symptoms such as cough, fever, chills, shortness of breath, etc. between now and your scheduled surgery, please notify us at the above number     Remember:  Do not eat after midnight the night before your surgery  You may drink clear liquids until 8:30am the morning of your surgery.   Clear liquids allowed are: Water, Non-Citrus Juices (without pulp), Carbonated Beverages, Clear Tea, Black Coffee ONLY (NO MILK, CREAM OR POWDERED CREAMER of any kind), and Gatorade    Take these medicines the morning of surgery with A SIP OF WATER:  pantoprazole (PROTONIX  sulfaSALAzine (AZULFIDINE)   IF NEEDED: acetaminophen (TYLENOL)  carboxymethylcellulose (REFRESH PLUS)  SUMAtriptan (IMITREX)   As of today, STOP taking any Aspirin (unless otherwise instructed by your surgeon) Aleve, Naproxen, Ibuprofen, Motrin, Advil, Goody's, BC's, all herbal medications, fish oil, and all vitamins.           Do not wear jewelry or makeup. Do not wear lotions, powders, perfumes/cologne or deodorant. Do not shave 48 hours prior to surgery.   Do not bring valuables to the hospital. Do not wear nail polish, gel polish, artificial nails, or any other type of covering on natural nails (fingers and toes) If you have artificial nails or gel coating that need to be removed by a nail salon, please have this removed prior to surgery. Artificial nails or gel coating may interfere with anesthesia's ability to adequately monitor your vital signs.  [facility] is not responsible for any belongings or valuables.    Do NOT Smoke (Tobacco/Vaping)  24  hours prior to your procedure  If you use a CPAP at night, you may bring your mask for your overnight stay.   Contacts, glasses, hearing aids, dentures or partials may not be worn into surgery, please bring cases for these belongings   For patients admitted to the hospital, discharge time will be determined by your treatment team.   Patients discharged the day of surgery will not be allowed to drive home, and someone needs to stay with them for 24 hours.   SURGICAL WAITING ROOM VISITATION Patients having surgery or a procedure may have no more than 2 support people in the waiting area - these visitors may rotate.   Children under the age of 35 must have an adult with them who is not the patient. If the patient needs to stay at the hospital during part of their recovery, the visitor guidelines for inpatient rooms apply. Pre-op nurse will coordinate an appropriate time for 1 support person to accompany patient in pre-op.  This support person may not rotate.   Please refer to [email or web address] for the visitor guidelines for Inpatients (after your surgery is over and you are in a regular room).    Special instructions:    Oral Hygiene is also important to reduce your risk of infection.  Remember - BRUSH YOUR TEETH THE MORNING OF SURGERY WITH YOUR REGULAR TOOTHPASTE   [facility]- Preparing For Surgery  Before surgery, you can play an important role. Because skin is not sterile, your skin needs to be as free of germs  as possible. You can reduce the number of germs on your skin by washing with CHG (chlorahexidine gluconate) Soap before surgery.  CHG is an antiseptic cleaner which kills germs and bonds with the skin to continue killing germs even after washing.     Please do not use if you have an allergy to CHG or antibacterial soaps. If your skin becomes reddened/irritated stop using the CHG.  Do not shave (including legs and underarms)  for at least 48 hours prior to first CHG shower. It is OK to shave your face.  Please follow these instructions carefully.     Shower the NIGHT BEFORE SURGERY and the MORNING OF SURGERY with CHG Soap.   If you chose to wash your hair, wash your hair first as usual with your normal shampoo. After you shampoo, rinse your hair and body thoroughly to remove the shampoo.  Then [employer] and genitals (private parts) with your normal soap and rinse thoroughly to remove soap.  After that Use CHG Soap as you would any other liquid soap. You can apply CHG directly to the skin and wash gently with a scrungie or a clean washcloth.   Apply the CHG Soap to your body ONLY FROM THE NECK DOWN.  Do not use on open wounds or open sores. Avoid contact with your eyes, ears, mouth and genitals (private parts). Wash Face and genitals (private parts)  with your normal soap.   Wash thoroughly, paying special attention to the area where your surgery will be performed.  Thoroughly rinse your body with warm water from the neck down.  DO NOT shower/wash with your normal soap after using and rinsing off the CHG Soap.  Pat yourself dry with a CLEAN TOWEL.  Wear CLEAN PAJAMAS to bed the night before surgery  Place CLEAN SHEETS on your bed the night before your surgery  DO NOT SLEEP WITH PETS.   Day of Surgery: Take a shower with CHG soap. Wear Clean/Comfortable clothing the morning of surgery Do not apply any deodorants/lotions.   Remember to brush your teeth WITH YOUR REGULAR TOOTHPASTE.    If you received a COVID test during your pre-op visit, it is requested that you wear a mask when out in public, stay away from anyone that may not be feeling well, and notify your surgeon if you develop symptoms. If you have been in contact with anyone that has tested positive in the last 10 days, please notify your surgeon.    Please read over the following fact sheets that you were given.

## 2022-01-04 ENCOUNTER — Encounter (HOSPITAL_COMMUNITY): Payer: Self-pay

## 2022-01-04 ENCOUNTER — Other Ambulatory Visit: Payer: Self-pay

## 2022-01-04 ENCOUNTER — Encounter (HOSPITAL_COMMUNITY)
Admission: RE | Admit: 2022-01-04 | Discharge: 2022-01-04 | Disposition: A | Discharge status: Home / Self Care | Payer: Medicare Other | Source: Ambulatory Visit | Attending: Orthopedic Surgery | Admitting: Orthopedic Surgery

## 2022-01-04 VITALS — BP 133/81 | HR 88 | Temp 97.9°F | Resp 17 | Ht 62.0 in | Wt 209.4 lb

## 2022-01-04 DIAGNOSIS — Z01818 Encounter for other preprocedural examination: Secondary | ICD-10-CM | POA: Diagnosis not present

## 2022-01-04 DIAGNOSIS — R002 Palpitations: Secondary | ICD-10-CM | POA: Diagnosis not present

## 2022-01-04 LAB — SURGICAL PCR SCREEN
MRSA, PCR: NEGATIVE
Staphylococcus aureus: NEGATIVE

## 2022-01-04 NOTE — Progress Notes (Signed)
PCP - [name] Cardiologist - denies  PPM/ICD - denies   Chest x-ray - n/a EKG - 01/04/22 Stress Test - denies ECHO - denies Cardiac Cath - denies  Sleep Study - denies    As of today, STOP taking any Aspirin (unless otherwise instructed by your surgeon) Aleve, Naproxen, Ibuprofen, Motrin, Advil, Goody's, BC's, all herbal medications, fish oil, and all vitamins.   ERAS Protcol -yes PRE-SURGERY Ensure or G2- not ordered  COVID TEST- not needed   Anesthesia review: no  Patient denies shortness of breath, fever, cough and chest pain at PAT appointment   All instructions explained to the patient, with a verbal understanding of the material. Patient agrees to go over the instructions while at home for a better understanding. Patient also instructed to self quarantine after being tested for COVID-19. The opportunity to ask questions was provided.

## 2022-01-08 MED ORDER — TRANEXAMIC ACID 1000 MG/10ML IV SOLN
2000.0000 mg | INTRAVENOUS | Status: DC
  Filled 2022-01-08 (×2): qty 20

## 2022-01-10 NOTE — Anesthesia Preprocedure Evaluation (Addendum)
Anesthesia Evaluation  Patient identified by MRN, date of birth, ID band Patient awake    Reviewed: Allergy & Precautions, NPO status , Patient's Chart, lab work & pertinent test results  History of Anesthesia Complications Negative for: history of anesthetic complications  Airway Mallampati: III  TM Distance: >3 FB Neck ROM: Full    Dental no notable dental hx.    Pulmonary neg pulmonary ROS   Pulmonary exam normal breath sounds clear to auscultation       Cardiovascular negative cardio ROS  Rhythm:Regular Rate:Normal     Neuro/Psych  Headaches, neg Seizures  Neuromuscular disease (lumbar radiculopathy)    GI/Hepatic Neg liver ROS, PUD,GERD  ,,UC   Endo/Other  negative endocrine ROS    Renal/GU negative Renal ROS     Musculoskeletal  (+) Arthritis ,  scoliosis   Abdominal  (+) + obese  Peds  Hematology  (+) Blood dyscrasia (thalassemia), anemia   Anesthesia Other Findings   Reproductive/Obstetrics                             Anesthesia Physical Anesthesia Plan  ASA: 2  Anesthesia Plan: General   Post-op Pain Management: Tylenol PO (pre-op)*   Induction: Intravenous  PONV Risk Score and Plan: 3 and Ondansetron and Dexamethasone  Airway Management Planned: Oral ETT  Additional Equipment:   Intra-op Plan:   Post-operative Plan: Extubation in OR  Informed Consent: I have reviewed the patients History and Physical, chart, labs and discussed the procedure including the risks, benefits and alternatives for the proposed anesthesia with the patient or authorized representative who has indicated his/her understanding and acceptance.     Dental advisory given  Plan Discussed with: CRNA and Anesthesiologist  Anesthesia Plan Comments: (Risks of general anesthesia discussed including, but not limited to, sore throat, hoarse voice, chipped/damaged teeth, injury to vocal cords,  nausea and vomiting, allergic reactions, lung infection, heart attack, stroke, and death. All questions answered. )       Anesthesia Quick Evaluation

## 2022-01-11 ENCOUNTER — Ambulatory Visit (HOSPITAL_COMMUNITY): Payer: Medicare Other

## 2022-01-11 ENCOUNTER — Ambulatory Visit (HOSPITAL_BASED_OUTPATIENT_CLINIC_OR_DEPARTMENT_OTHER): Payer: Medicare Other | Admitting: Anesthesiology

## 2022-01-11 ENCOUNTER — Ambulatory Visit (HOSPITAL_COMMUNITY): Payer: Medicare Other | Admitting: Anesthesiology

## 2022-01-11 ENCOUNTER — Observation Stay (HOSPITAL_COMMUNITY)
Admission: RE | Admit: 2022-01-11 | Discharge: 2022-01-12 | Disposition: A | Discharge status: Home / Self Care | Payer: Medicare Other | Attending: Orthopedic Surgery | Admitting: Orthopedic Surgery

## 2022-01-11 ENCOUNTER — Other Ambulatory Visit: Payer: Self-pay

## 2022-01-11 ENCOUNTER — Encounter (HOSPITAL_COMMUNITY)
Admission: RE | Disposition: A | Discharge status: Home / Self Care | Payer: Self-pay | Source: Home / Self Care | Attending: Orthopedic Surgery

## 2022-01-11 DIAGNOSIS — M961 Postlaminectomy syndrome, not elsewhere classified: Principal | ICD-10-CM | POA: Insufficient documentation

## 2022-01-11 DIAGNOSIS — Z8616 Personal history of COVID-19: Secondary | ICD-10-CM | POA: Diagnosis not present

## 2022-01-11 DIAGNOSIS — G8929 Other chronic pain: Secondary | ICD-10-CM | POA: Diagnosis present

## 2022-01-11 DIAGNOSIS — M4325 Fusion of spine, thoracolumbar region: Secondary | ICD-10-CM | POA: Diagnosis not present

## 2022-01-11 DIAGNOSIS — G894 Chronic pain syndrome: Secondary | ICD-10-CM | POA: Diagnosis not present

## 2022-01-11 DIAGNOSIS — Z9682 Presence of neurostimulator: Secondary | ICD-10-CM | POA: Diagnosis not present

## 2022-01-11 HISTORY — PX: SPINAL CORD STIMULATOR INSERTION: SHX5378

## 2022-01-11 SURGERY — INSERTION, SPINAL CORD STIMULATOR, LUMBAR
Anesthesia: General | Site: Spine Thoracic

## 2022-01-11 MED ORDER — TRANEXAMIC ACID-NACL 1000-0.7 MG/100ML-% IV SOLN
INTRAVENOUS | Status: AC
  Filled 2022-01-11: qty 100

## 2022-01-11 MED ORDER — DEXAMETHASONE SODIUM PHOSPHATE 10 MG/ML IJ SOLN
INTRAMUSCULAR | Status: AC
  Filled 2022-01-11: qty 1

## 2022-01-11 MED ORDER — PROPOFOL 10 MG/ML IV BOLUS
INTRAVENOUS | Status: AC
  Filled 2022-01-11: qty 20

## 2022-01-11 MED ORDER — METHOCARBAMOL 500 MG PO TABS: 500.0000 mg | ORAL_TABLET | Freq: Three times a day (TID) | ORAL | 0 refills | Status: AC | PRN

## 2022-01-11 MED ORDER — METHOCARBAMOL 1000 MG/10ML IJ SOLN
500.0000 mg | Freq: Four times a day (QID) | INTRAVENOUS | Status: DC | PRN
  Filled 2022-01-11: qty 5

## 2022-01-11 MED ORDER — ONDANSETRON HCL 4 MG PO TABS: 4.0000 mg | ORAL_TABLET | Freq: Three times a day (TID) | ORAL | 0 refills | Status: DC | PRN

## 2022-01-11 MED ORDER — METHOCARBAMOL 500 MG PO TABS
500.0000 mg | ORAL_TABLET | Freq: Four times a day (QID) | ORAL | Status: DC | PRN
  Administered 2022-01-11 – 2022-01-12 (×2): 500 mg via ORAL
  Filled 2022-01-11 (×3): qty 1

## 2022-01-11 MED ORDER — ONDANSETRON HCL 4 MG PO TABS: 4.0000 mg | ORAL_TABLET | Freq: Four times a day (QID) | ORAL | Status: DC | PRN

## 2022-01-11 MED ORDER — THROMBIN 20000 UNITS EX SOLR
CUTANEOUS | Status: AC
  Filled 2022-01-11: qty 20000

## 2022-01-11 MED ORDER — HYDROMORPHONE HCL 1 MG/ML IJ SOLN
1.0000 mg | INTRAMUSCULAR | Status: DC | PRN
  Administered 2022-01-11: 1 mg via INTRAVENOUS
  Filled 2022-01-11: qty 1

## 2022-01-11 MED ORDER — CEFAZOLIN SODIUM-DEXTROSE 2-4 GM/100ML-% IV SOLN
2.0000 g | INTRAVENOUS | Status: DC
  Filled 2022-01-11: qty 100

## 2022-01-11 MED ORDER — CHLORHEXIDINE GLUCONATE 0.12 % MT SOLN
15.0000 mL | Freq: Once | OROMUCOSAL | Status: AC
  Administered 2022-01-11: 15 mL via OROMUCOSAL
  Filled 2022-01-11: qty 15

## 2022-01-11 MED ORDER — FENTANYL CITRATE (PF) 250 MCG/5ML IJ SOLN
INTRAMUSCULAR | Status: AC
  Filled 2022-01-11: qty 5

## 2022-01-11 MED ORDER — PHENYLEPHRINE 80 MCG/ML (10ML) SYRINGE FOR IV PUSH (FOR BLOOD PRESSURE SUPPORT)
PREFILLED_SYRINGE | INTRAVENOUS | Status: DC | PRN
  Administered 2022-01-11 (×8): 40 ug via INTRAVENOUS
  Administered 2022-01-11: 80 ug via INTRAVENOUS

## 2022-01-11 MED ORDER — FENTANYL CITRATE (PF) 100 MCG/2ML IJ SOLN
25.0000 ug | INTRAMUSCULAR | Status: DC | PRN
  Administered 2022-01-11 (×2): 50 ug via INTRAVENOUS

## 2022-01-11 MED ORDER — LACTATED RINGERS IV SOLN: INTRAVENOUS | Status: DC

## 2022-01-11 MED ORDER — ACETAMINOPHEN 650 MG RE SUPP: 650.0000 mg | RECTAL | Status: DC | PRN

## 2022-01-11 MED ORDER — PROPOFOL 10 MG/ML IV BOLUS
INTRAVENOUS | Status: DC | PRN
  Administered 2022-01-11: 200 mg via INTRAVENOUS

## 2022-01-11 MED ORDER — POLYETHYLENE GLYCOL 3350 17 G PO PACK: 17.0000 g | PACK | Freq: Every day | ORAL | Status: DC | PRN

## 2022-01-11 MED ORDER — PHENOL 1.4 % MT LIQD: 1.0000 | OROMUCOSAL | Status: DC | PRN

## 2022-01-11 MED ORDER — PROMETHAZINE HCL 25 MG/ML IJ SOLN: 6.2500 mg | INTRAMUSCULAR | Status: DC | PRN

## 2022-01-11 MED ORDER — THROMBIN 20000 UNITS EX SOLR: CUTANEOUS | Status: DC | PRN

## 2022-01-11 MED ORDER — ROCURONIUM BROMIDE 10 MG/ML (PF) SYRINGE
PREFILLED_SYRINGE | INTRAVENOUS | Status: DC | PRN
  Administered 2022-01-11: 50 mg via INTRAVENOUS
  Administered 2022-01-11 (×2): 10 mg via INTRAVENOUS

## 2022-01-11 MED ORDER — DEXAMETHASONE SODIUM PHOSPHATE 10 MG/ML IJ SOLN
INTRAMUSCULAR | Status: DC | PRN
  Administered 2022-01-11: 10 mg via INTRAVENOUS

## 2022-01-11 MED ORDER — ORAL CARE MOUTH RINSE: 15.0000 mL | Freq: Once | OROMUCOSAL | Status: AC

## 2022-01-11 MED ORDER — MIDAZOLAM HCL 2 MG/2ML IJ SOLN
INTRAMUSCULAR | Status: DC | PRN
  Administered 2022-01-11: 2 mg via INTRAVENOUS

## 2022-01-11 MED ORDER — MAGNESIUM CITRATE PO SOLN: 1.0000 | Freq: Once | ORAL | Status: DC | PRN

## 2022-01-11 MED ORDER — OXYCODONE HCL 5 MG/5ML PO SOLN: 5.0000 mg | Freq: Once | ORAL | Status: DC | PRN

## 2022-01-11 MED ORDER — SENNOSIDES-DOCUSATE SODIUM 8.6-50 MG PO TABS
1.0000 | ORAL_TABLET | Freq: Two times a day (BID) | ORAL | Status: DC
  Administered 2022-01-11: 1 via ORAL
  Filled 2022-01-11: qty 1

## 2022-01-11 MED ORDER — BUPIVACAINE-EPINEPHRINE 0.25% -1:200000 IJ SOLN
INTRAMUSCULAR | Status: DC | PRN
  Administered 2022-01-11: 20 mL

## 2022-01-11 MED ORDER — OXYCODONE-ACETAMINOPHEN 10-325 MG PO TABS: 1.0000 | ORAL_TABLET | Freq: Four times a day (QID) | ORAL | 0 refills | Status: AC | PRN

## 2022-01-11 MED ORDER — SUGAMMADEX SODIUM 200 MG/2ML IV SOLN
INTRAVENOUS | Status: DC | PRN
  Administered 2022-01-11: 200 mg via INTRAVENOUS

## 2022-01-11 MED ORDER — ONDANSETRON HCL 4 MG/2ML IJ SOLN
4.0000 mg | Freq: Four times a day (QID) | INTRAMUSCULAR | Status: DC | PRN
  Filled 2022-01-11: qty 2

## 2022-01-11 MED ORDER — TRANEXAMIC ACID-NACL 1000-0.7 MG/100ML-% IV SOLN
INTRAVENOUS | Status: DC | PRN
  Administered 2022-01-11: 1000 mg via INTRAVENOUS

## 2022-01-11 MED ORDER — BUPIVACAINE-EPINEPHRINE (PF) 0.25% -1:200000 IJ SOLN
INTRAMUSCULAR | Status: AC
  Filled 2022-01-11: qty 30

## 2022-01-11 MED ORDER — SURGIFLO WITH THROMBIN (HEMOSTATIC MATRIX KIT) OPTIME
TOPICAL | Status: DC | PRN
  Administered 2022-01-11 (×2): 1 via TOPICAL

## 2022-01-11 MED ORDER — SODIUM CHLORIDE 0.9% FLUSH: 3.0000 mL | INTRAVENOUS | Status: DC | PRN

## 2022-01-11 MED ORDER — ACETAMINOPHEN 325 MG PO TABS
650.0000 mg | ORAL_TABLET | ORAL | Status: DC | PRN
  Administered 2022-01-11: 650 mg via ORAL
  Filled 2022-01-11: qty 2

## 2022-01-11 MED ORDER — MIDAZOLAM HCL 2 MG/2ML IJ SOLN
INTRAMUSCULAR | Status: AC
  Filled 2022-01-11: qty 2

## 2022-01-11 MED ORDER — FENTANYL CITRATE (PF) 250 MCG/5ML IJ SOLN
INTRAMUSCULAR | Status: DC | PRN
  Administered 2022-01-11 (×2): 50 ug via INTRAVENOUS
  Administered 2022-01-11: 100 ug via INTRAVENOUS
  Administered 2022-01-11: 50 ug via INTRAVENOUS

## 2022-01-11 MED ORDER — ACETAMINOPHEN 500 MG PO TABS
1000.0000 mg | ORAL_TABLET | Freq: Once | ORAL | Status: AC
  Administered 2022-01-11: 1000 mg via ORAL
  Filled 2022-01-11: qty 2

## 2022-01-11 MED ORDER — CEFAZOLIN SODIUM-DEXTROSE 1-4 GM/50ML-% IV SOLN
1.0000 g | Freq: Three times a day (TID) | INTRAVENOUS | Status: AC
  Administered 2022-01-11 – 2022-01-12 (×2): 1 g via INTRAVENOUS
  Filled 2022-01-11 (×2): qty 50

## 2022-01-11 MED ORDER — OXYCODONE HCL 5 MG PO TABS: 5.0000 mg | ORAL_TABLET | ORAL | Status: DC | PRN

## 2022-01-11 MED ORDER — OXYCODONE HCL 5 MG PO TABS
10.0000 mg | ORAL_TABLET | ORAL | Status: DC | PRN
  Administered 2022-01-11 – 2022-01-12 (×4): 10 mg via ORAL
  Filled 2022-01-11 (×4): qty 2

## 2022-01-11 MED ORDER — ONDANSETRON HCL 4 MG/2ML IJ SOLN
INTRAMUSCULAR | Status: DC | PRN
  Administered 2022-01-11: 4 mg via INTRAVENOUS

## 2022-01-11 MED ORDER — LIDOCAINE 2% (20 MG/ML) 5 ML SYRINGE
INTRAMUSCULAR | Status: AC
  Filled 2022-01-11: qty 5

## 2022-01-11 MED ORDER — THROMBIN 20000 UNITS EX KIT: PACK | CUTANEOUS | Status: DC | PRN

## 2022-01-11 MED ORDER — SODIUM CHLORIDE 0.9 % IV SOLN
250.0000 mL | INTRAVENOUS | Status: DC
  Administered 2022-01-11: 250 mL via INTRAVENOUS

## 2022-01-11 MED ORDER — FENTANYL CITRATE (PF) 100 MCG/2ML IJ SOLN
INTRAMUSCULAR | Status: AC
  Filled 2022-01-11: qty 2

## 2022-01-11 MED ORDER — ONDANSETRON HCL 4 MG/2ML IJ SOLN
INTRAMUSCULAR | Status: AC
  Filled 2022-01-11: qty 2

## 2022-01-11 MED ORDER — LIDOCAINE 2% (20 MG/ML) 5 ML SYRINGE
INTRAMUSCULAR | Status: DC | PRN
  Administered 2022-01-11: 100 mg via INTRAVENOUS

## 2022-01-11 MED ORDER — OXYCODONE HCL 5 MG PO TABS: 5.0000 mg | ORAL_TABLET | Freq: Once | ORAL | Status: DC | PRN

## 2022-01-11 MED ORDER — ROCURONIUM BROMIDE 10 MG/ML (PF) SYRINGE
PREFILLED_SYRINGE | INTRAVENOUS | Status: AC
  Filled 2022-01-11: qty 10

## 2022-01-11 MED ORDER — SODIUM CHLORIDE 0.9% FLUSH: 3.0000 mL | Freq: Two times a day (BID) | INTRAVENOUS | Status: DC

## 2022-01-11 MED ORDER — MENTHOL 3 MG MT LOZG: 1.0000 | LOZENGE | OROMUCOSAL | Status: DC | PRN

## 2022-01-11 MED ORDER — 0.9 % SODIUM CHLORIDE (POUR BTL) OPTIME
TOPICAL | Status: DC | PRN
  Administered 2022-01-11 (×3): 1000 mL

## 2022-01-11 SURGICAL SUPPLY — 63 items
BAG COUNTER SPONGE SURGICOUNT (BAG) IMPLANT
CANISTER SUCT 3000ML PPV (MISCELLANEOUS) ×1 IMPLANT
CLSR STERI-STRIP ANTIMIC 1/2X4 (GAUZE/BANDAGES/DRESSINGS) ×1 IMPLANT
CONTROLLER NEUROSTIM PATIENT (NEUROSURGERY SUPPLIES) IMPLANT
COVER MAYO STAND STRL (DRAPES) ×1 IMPLANT
COVER PROBE W GEL 5X96 (DRAPES) IMPLANT
COVER SURGICAL LIGHT HANDLE (MISCELLANEOUS) ×1 IMPLANT
DRAPE C-ARM 42X72 X-RAY (DRAPES) ×1 IMPLANT
DRAPE C-ARMOR (DRAPES) IMPLANT
DRAPE SURG 17X23 STRL (DRAPES) ×1 IMPLANT
DRAPE U-SHAPE 47X51 STRL (DRAPES) ×1 IMPLANT
DRSG OPSITE POSTOP 4X6 (GAUZE/BANDAGES/DRESSINGS) ×1 IMPLANT
DRSG OPSITE POSTOP 4X8 (GAUZE/BANDAGES/DRESSINGS) IMPLANT
DURAPREP 26ML APPLICATOR (WOUND CARE) ×1 IMPLANT
ELECT BLADE 4.0 EZ CLEAN MEGAD (MISCELLANEOUS) ×1
ELECT CAUTERY BLADE 6.4 (BLADE) IMPLANT
ELECT PENCIL ROCKER SW 15FT (MISCELLANEOUS) ×1 IMPLANT
ELECT REM PT RETURN 9FT ADLT (ELECTROSURGICAL) ×1
ELECTRODE BLDE 4.0 EZ CLN MEGD (MISCELLANEOUS) IMPLANT
ELECTRODE REM PT RTRN 9FT ADLT (ELECTROSURGICAL) ×1 IMPLANT
GENERATOR PROCLAIM PLUS 5 (Neuro Prosthesis/Implant) IMPLANT
GLOVE BIO SURGEON STRL SZ7 (GLOVE) ×1 IMPLANT
GLOVE BIOGEL PI IND STRL 7.0 (GLOVE) ×1 IMPLANT
GLOVE BIOGEL PI IND STRL 8.5 (GLOVE) ×1 IMPLANT
GLOVE SS N UNI LF 8.5 STRL (GLOVE) ×1 IMPLANT
GOWN STRL REUS W/ TWL LRG LVL3 (GOWN DISPOSABLE) ×2 IMPLANT
GOWN STRL REUS W/TWL 2XL LVL3 (GOWN DISPOSABLE) ×1 IMPLANT
GOWN STRL REUS W/TWL LRG LVL3 (GOWN DISPOSABLE) ×1
KIT BASIN OR (CUSTOM PROCEDURE TRAY) ×1 IMPLANT
KIT TURNOVER KIT B (KITS) ×1 IMPLANT
LAMI NARROW PRIPOLE 16CH (Orthopedic Implant) IMPLANT
NDL 1/2 CIR MAYO (NEEDLE) IMPLANT
NDL 22X1.5 STRL (OR ONLY) (MISCELLANEOUS) ×1 IMPLANT
NDL SPNL 18GX3.5 QUINCKE PK (NEEDLE) ×1 IMPLANT
NEEDLE 1/2 CIR MAYO (NEEDLE) ×1 IMPLANT
NEEDLE 22X1.5 STRL (OR ONLY) (MISCELLANEOUS) ×1 IMPLANT
NEEDLE SPNL 18GX3.5 QUINCKE PK (NEEDLE) ×1 IMPLANT
NS IRRIG 1000ML POUR BTL (IV SOLUTION) ×1 IMPLANT
PACK LAMINECTOMY ORTHO (CUSTOM PROCEDURE TRAY) ×1 IMPLANT
PACK UNIVERSAL I (CUSTOM PROCEDURE TRAY) ×1 IMPLANT
PAD ARMBOARD 7.5X6 YLW CONV (MISCELLANEOUS) ×3 IMPLANT
SPATULA SILICONE BRAIN 10MM (MISCELLANEOUS) IMPLANT
SPONGE SURGIFOAM ABS GEL 100 (HEMOSTASIS) ×1 IMPLANT
SPONGE T-LAP 4X18 ~~LOC~~+RFID (SPONGE) IMPLANT
STAPLER VISISTAT 35W (STAPLE) IMPLANT
SURGIFLO W/THROMBIN 8M KIT (HEMOSTASIS) ×1 IMPLANT
SUT BONE WAX W31G (SUTURE) ×1 IMPLANT
SUT ETHIBOND 2 OS 4 DA (SUTURE) ×1 IMPLANT
SUT MNCRL AB 3-0 PS2 18 (SUTURE) ×2 IMPLANT
SUT MNCRL+ AB 3-0 CT1 36 (SUTURE) IMPLANT
SUT MONOCRYL AB 3-0 CT1 36IN (SUTURE) ×2
SUT VIC AB 0 CT1 27 (SUTURE) ×1
SUT VIC AB 0 CT1 27XBRD ANBCTR (SUTURE) IMPLANT
SUT VIC AB 1 CT1 18XCR BRD 8 (SUTURE) ×2 IMPLANT
SUT VIC AB 1 CT1 8-18 (SUTURE) ×2
SUT VIC AB 2-0 CT1 18 (SUTURE) ×1 IMPLANT
SYR BULB IRRIG 60ML STRL (SYRINGE) ×1 IMPLANT
SYR CONTROL 10ML LL (SYRINGE) ×1 IMPLANT
TOWEL GREEN STERILE (TOWEL DISPOSABLE) ×1 IMPLANT
TOWEL GREEN STERILE FF (TOWEL DISPOSABLE) ×1 IMPLANT
TUBE CONNECTING 12X1/4 (SUCTIONS) IMPLANT
WATER STERILE IRR 1000ML POUR (IV SOLUTION) ×1 IMPLANT
YANKAUER SUCT BULB TIP NO VENT (SUCTIONS) ×1 IMPLANT

## 2022-01-11 NOTE — Transfer of Care (Signed)
Immediate Anesthesia Transfer of Care Note  Patient: [name]  Procedure(s) Performed: SPINAL CORD STIMULATOR PLACEMENT (Spine Thoracic)  Patient Location: PACU  Anesthesia Type:General  Level of Consciousness: drowsy  Airway & Oxygen Therapy: Patient Spontanous Breathing and Patient connected to nasal cannula oxygen  Post-op Assessment: Report given to RN and Post -op Vital signs reviewed and stable  Post vital signs: Reviewed and stable  Last Vitals:  Vitals Value Taken Time  BP 138/83 01/11/22 1545  Temp 36.6 C 01/11/22 1545  Pulse 74 01/11/22 1551  Resp 19 01/11/22 1551  SpO2 97 % 01/11/22 1551  Vitals shown include unvalidated device data.  Last Pain:  Vitals:   01/11/22 0933  TempSrc:   PainSc: 0-No pain         Complications: No notable events documented.

## 2022-01-11 NOTE — H&P (Signed)
History:  [name] is a very pleasant 66 year old woman whose had chronic low back buttock and neuropathic leg pain. She has had previous lumbar surgery but unfortunately she continues to have pain. Recently she had a spinal cord stimulator trial and noted significant temporary improvement of her pain and quality of life. Having had a positive trial she presents now for definitive implantation of the spinal cord stimulator.  Past Medical History:  Diagnosis Date   Arthritis    Cataract    bilateral cataracts   COVID-19    GERD (gastroesophageal reflux disease)    Microcytic anemia    Migraine    Scoliosis    Thalassemia    Thalassemia    Ulcerative pancolitis (HCC)    Colonoscopy with Dr. Hilarie Fredrickson   Vitamin D deficiency     No Known Allergies  No current facility-administered medications on file prior to encounter.   Current Outpatient Medications on File Prior to Encounter  Medication Sig Dispense Refill   acetaminophen (TYLENOL) 500 MG tablet Take 1,000 mg by mouth every 6 (six) hours as needed for moderate pain or mild pain.     calcium carbonate (TUMS - DOSED IN MG ELEMENTAL CALCIUM) 500 MG chewable tablet Chew 1 tablet by mouth daily as needed for indigestion or heartburn.     carboxymethylcellulose (REFRESH PLUS) 0.5 % SOLN Place 1 drop into both eyes 3 (three) times daily as needed (dry eyes).     folic acid (FOLVITE) 1 MG tablet TAKE 1 TABLET(1 MG) BY MOUTH DAILY 90 tablet 1   hydrocortisone cream 1 % Apply 1 application topically 2 (two) times daily as needed for itching.     sulfaSALAzine (AZULFIDINE) 500 MG tablet Take 2 tablets (1,000 mg total) by mouth 2 (two) times daily. TAKE 1 TABLET BY MOUTH TWICE DAILY FOR 1 WEEK. INCREASE TO 2 TABLETS BY MOUTH 2 TIMES DAILY THEREAFTER (Patient taking differently: Take 1,000 mg by mouth 2 (two) times daily.) 120 tablet 3   SUMAtriptan (IMITREX) 100 MG tablet Take 1 tablet (100 mg total) by mouth every 2 (two) hours as needed for  migraine. 10 tablet 6    Physical Exam: Vitals:   01/11/22 0925  BP: (!) 171/88  Pulse: 76  Resp: 18  Temp: 97.9 F (36.6 C)  SpO2: 95%   Body mass index is 37.68 kg/m. Clinical exam: [name] is a pleasant individual, who appears younger than their stated age.  She is alert and orientated 3.  No shortness of breath, chest pain.  Abdomen is soft and non-tender, negative loss of bowel and bladder control, no rebound tenderness.  Negative: skin lesions abrasions contusions  Peripheral pulses: 2+ peripheral pulses bilaterally. LE compartments are: Soft and nontender.  Gait pattern: Normal  Assistive devices: None  Neuro: Intermittent bilateral lower extremity dysesthesias left side worse than the right. 5/5 motor strength. Negative Babinski test, no clonus. Symmetrical 2+ deep tendon reflexes bilaterally.  Musculoskeletal: Severe low back pain with diminished range of motion. Pain radiates from the midline down to the paraspinal region. No significant SI joint pain.  Imaging: X-rays of the lumbar spine demonstrate a solid to level fusion L3-4 and L4-5 with interbody fixation and posterior pedicle screw fixation. 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 She has a slight adjacent degenerative scoliosis above her fusion.  Thoracic MRI: completed on 11/29/2021. No significant central stenosis in the thoracic spine. No cord signal change. Multilevel disc protrusions causing lateral recess stenosis. No  significant pathology that would prohibit implantation of a spinal cord stimulator.   A/P:  Summary: [name] is a very pleasant 66 year old woman who has had a two-level prior lumbar fusion and unfortunately continues to have significant back buttock and intermittent neuropathic left leg pain. Despite appropriate conservative management her quality of life is failed to improve. She has had a previous repeat lumbar MRI which demonstrated the adjacent segment  disease. We briefly discussed a revision fusion procedure which she does not want to entertain at this time.    Because of her significant pain she ultimately had a spinal cord stimulator trial and noted 50% improvement in her pain but is significant overall improvement in her quality of life. At this point she presents because she would like to have a permanent implantation. I have gone over the surgical procedure with her in great detail and we have discussed the risks, benefits, and alternatives to surgery.    Risks and benefits of surgery were discussed with the patient. These include: Infection, bleeding, death, stroke, paralysis, ongoing or worse pain, need for additional surgery, leak of spinal fluid, Failure of the battery requiring reoperation. Inability to place the paddle requiring the surgery to be aborted. Migration of the lead, failure to obtain results similar to the trial.

## 2022-01-11 NOTE — Brief Op Note (Signed)
01/11/2022  3:54 PM  PATIENT:  [name]  66 y.o. female  PRE-OPERATIVE DIAGNOSIS:  Failed back syndrome, status post successful spinal cord stimulator trial  POST-OPERATIVE DIAGNOSIS:  Failed back syndrome, status post successful spinal cord stimulator trial  PROCEDURE:  Procedure(s) with comments: SPINAL CORD STIMULATOR PLACEMENT (N/A) - 3 hrs 3 C-Bed  SURGEON:  Surgeon(s) and Role:    Melina Schools, MD - Primary  PHYSICIAN ASSISTANT:   ASSISTANTS: none   ANESTHESIA:   general  EBL:  150 mL   BLOOD ADMINISTERED:none  DRAINS: none   LOCAL MEDICATIONS USED:  MARCAINE     SPECIMEN:  No Specimen  DISPOSITION OF SPECIMEN:  N/A  COUNTS:  YES  TOURNIQUET:  * No tourniquets in log *  DICTATION: .Dragon Dictation  PLAN OF CARE: Admit for overnight observation  PATIENT DISPOSITION:  PACU - hemodynamically stable.

## 2022-01-11 NOTE — Anesthesia Procedure Notes (Signed)
Procedure Name: Intubation Date/Time: 01/11/2022 12:47 PM  Performed by: Michele Rockers, CRNAPre-anesthesia Checklist: Patient identified, Patient being monitored, Timeout performed, Emergency Drugs available and Suction available Patient Re-evaluated:Patient Re-evaluated prior to induction Oxygen Delivery Method: Circle System Utilized Preoxygenation: Pre-oxygenation with 100% oxygen Induction Type: IV induction Ventilation: Mask ventilation without difficulty Laryngoscope Size: Miller and 2 Grade View: Grade I Tube type: Oral Tube size: 7.0 mm Number of attempts: 1 Airway Equipment and Method: Stylet Placement Confirmation: ETT inserted through vocal cords under direct vision, positive ETCO2 and breath sounds checked- equal and bilateral Secured at: 21 cm Tube secured with: Tape Dental Injury: Teeth and Oropharynx as per pre-operative assessment

## 2022-01-11 NOTE — Anesthesia Postprocedure Evaluation (Signed)
Anesthesia Post Note  Patient: [name]  Procedure(s) Performed: SPINAL CORD STIMULATOR PLACEMENT (Spine Thoracic)     Patient location during evaluation: PACU Anesthesia Type: General Level of consciousness: awake Pain management: pain level controlled Vital Signs Assessment: post-procedure vital signs reviewed and stable Respiratory status: spontaneous breathing, nonlabored ventilation and respiratory function stable Cardiovascular status: blood pressure returned to baseline and stable Postop Assessment: no apparent nausea or vomiting Anesthetic complications: no   No notable events documented.  Last Vitals:  Vitals:   01/11/22 1630 01/11/22 1645  BP: 128/69 130/73  Pulse: 73 72  Resp: 11 12  Temp:  36.6 C  SpO2: 96% 96%    Last Pain:  Vitals:   01/11/22 1645  TempSrc:   PainSc: Asleep                 Nilda Simmer

## 2022-01-11 NOTE — Op Note (Signed)
OPERATIVE REPORT  DATE OF SURGERY: 01/11/2022  PATIENT NAME:  [name] MRN: [id number] DOB: 1955/12/30  PCP: Midge Minium, MD  PRE-OPERATIVE DIAGNOSIS: Chronic pain syndromes.  Status post positive spinal cord stimulator trial.  POST-OPERATIVE DIAGNOSIS: Same  PROCEDURE:   Implantation of spinal cord stimulator  SURGEON:  Melina Schools, MD  PHYSICIAN ASSISTANT: None  ANESTHESIA:   General  EBL: 828 ml   Complications: None  Implants: Abbott spine:Lamitrode - tripole.  Proclaim +5  pulse generator  BRIEF HISTORY: [name] is a 66 y.o. female who has had prior lumbar spinal surgery and unfortunately has continued back buttock and neuropathic leg pain.  She recently had a spinal cord stimulator trial and noted significant improvement in her pain and quality of life as result of the positive trial we elected to move forward with permanent implantation.  All appropriate risks, benefits, and alternatives to surgery were discussed with patient and consent was obtained.  PROCEDURE DETAILS: Patient was brought into the operating room and was properly positioned on the operating room table.  After induction with general anesthesia the patient was endotracheally intubated.  A timeout was taken to confirm all important data: including patient, procedure, and the level. Teds, SCD's were applied.   Patient was turned prone onto the Wilson frame and all bony prominences were well-padded.  The back was then prepped and draped in a standard fashion.  Using fluoroscopy I marked out the T10 pedicle and the T10-11 intervertebral space.  I then marked out my incision to span from T10-T12.  The incision site was infiltrated with quarter percent Marcaine with epinephrine as was the battery site.  Thoracic midline incision was made and sharp dissection was carried out down to the deep fascia.  The deep fascia was sharply incised and I stripped the paraspinal muscles to expose the T10  minus process and lamina as well as that of T11 and the superior portion of the T12.  Once I had my self-retaining retractor in place I obtained hemostasis using bipolar electrocautery.  I confirmed the T10-11 interspinous process space on fluoroscopy and then To the inferior third of the spinous process of T10.  Using a 2 mm Kerrison rongeur I performed a laminotomy of T10 and then remove the ligamentum flavum to expose the dorsal surface of the thecal sac.  The implantable tripolar lead was then placed using gentle manipulation.  I confirmed that it was properly positioned in both the AP and lateral planes roughly at the T8-9 disc.  From satisfactory position in both planes and with the representative.  She confirmed that this was covering the same position as trial leads.  At this point the leads were then directly sutured to the T11 spinous process with an Ethibond suture.  They were then wrapped around the T11 spinous process to maintain position and prevent migration.  I then turned my attention to the battery site.  The incision was made and I bluntly dissected down approximately 2 and half centimeters and created a pocket.  Using the submuscular passer I eventually passed the leads from the thoracic wound to the battery.  The battery wound was then copes irrigated with normal saline and make sure that hemostasis bipolar cautery, Bovie, and Floseal.  Once I confirmed hemostasis of both wounds I then remove the Bovie and the bipolar and brought the pulse generator on the field.  The leads were then secured into the battery and tightened according manufacture standards.  The battery was then  placed in the pocket and the excess lead was coiled and placed on the undersurface.  The battery was then secured to the deep fascia with two #1 Vicryl sutures.  At this point both wounds were copiously irrigated with normal saline and I make sure that hemostasis.  I then closed the deep fascia of both wounds with  interrupted #1 Vicryl suture.  I then closed the thoracic wound in a layered fashion with a running 0 Vicryl stitch and then interrupted 2-0 Vicryl sutures.  3-0 Monocryl was then used for the skin.  The battery site was closed with interrupted 2-0 Vicryl suture layer and then 3-0 Monocryl.  Steri-Strips and dry dressings were applied and the patient was ultimately extubated and transferred to PACU without incident.  The end of the case all needle and sponge counts were correct.  There were no adverse intraoperative events.    Melina Schools, MD 01/11/2022 3:45 PM

## 2022-01-11 NOTE — Discharge Instructions (Signed)
Spinal Cord Stimulator  Care After  This sheet gives you information about how to care for yourself after your procedure. Your health care provider may also give you more specific instructions. If you have problems or questions, contact your health care provider. What can I expect after the procedure? After the procedure, it is common to have: Soreness or pain. Some swelling in the area where the hardware was removed. A small amount of blood or clear fluid coming from your incision. Follow these instructions at home: If you have a cast: Do not stick anything inside the cast to scratch your skin. Doing that increases your risk of infection. Check the skin around the cast every day. Tell your health care provider about any concerns. You may put lotion on dry skin around the edges of the cast. Do not put lotion on the skin underneath the cast. Keep the cast clean and dry. Do not take baths, swim, or use a hot tub until your health care provider approves. Ask your health care provider if you may take showers. You may only be allowed to take sponge baths. Keep the bandage (dressing) dry until your health care provider says it can be removed. Ok to shower in 5 days.    Incision care  Follow instructions from your health care provider about how to take care of your incision. Make sure you: Wash your hands with soap and water before you change your dressing. If soap and water are not available, use hand sanitizer. Change your dressing as told by your health care provider. Leave stitches (sutures), skin glue, or adhesive strips in place. These skin closures may need to stay in place for 2 weeks or longer. If adhesive strip edges start to loosen and curl up, you may trim the loose edges. Do not remove adhesive strips completely unless your health care provider tells you to do that. Check your incision area every day for signs of infection. Check for: Redness. More swelling or pain. More fluid  or blood. Warmth. Pus or a bad smell. Managing pain, stiffness, and swelling  If directed, put ice on the affected area: Put ice in a plastic bag. Place a towel between your skin and the bag. Leave the ice on for 20 minutes, 2-3 times a day. Move your fingers or toes often to avoid stiffness and to lessen swelling.  Driving Do not drive or use heavy machinery while taking prescription pain medicine. Do not drive for 24 hours if you were given a medicine to help you relax (sedative) during your procedure. Ask your health care provider when it is safe to drive if you have a cast, splint, or boot on the affected limb. Activity Ask your health care provider what activities are safe for you during recovery, and ask what activities you need to avoid. Do not use the injured limb to support your body weight until your health care provider says that you can. Do not play contact sports until your health care provider approves. Do exercises as told by your health care provider. Avoid sitting for a long time without moving. Get up and move around at least every few hours. This will help prevent blood clots. General instructions Do not put pressure on any part of the cast or splint until it is fully hardened. This may take several hours. If you are taking prescription pain medicine, take actions to prevent or treat constipation. Your health care provider may recommend that you: Drink enough fluid to keep  your urine pale yellow. Eat foods that are high in fiber, such as fresh fruits and vegetables, whole grains, and beans. Limit foods that are high in fat and processed sugars, such as fried or sweet foods. Take an over-the-counter or prescription medicine for constipation. Do not use any products that contain nicotine or tobacco, such as cigarettes and e-cigarettes. These can delay bone healing after surgery. If you need help quitting, ask your health care provider. Take over-the-counter and  prescription medicines only as told by your health care provider. Keep all follow-up visits as told by your health care provider. This is important. Contact a health care provider if: You have lasting pain. You have redness around your incision. You have more swelling or pain around your incision. You have more fluid or blood coming from your incision. Your incision feels warm to the touch. You have pus or a bad smell coming from your incision. You are unable to do exercises or physical activity as told by your health care provider. Get help right away if: You have difficulty breathing. You have chest pain. You have severe pain. You have a fever or chills. You have numbness for more than 24 hours in the area where the hardware was removed. Summary After the procedure, it is common to have some pain and swelling in the area where the hardware was removed. Follow instructions from your health care provider about how to take care of your incision. Return to your normal activities as told by your health care provider. Ask your health care provider what activities are safe for you. This information is not intended to replace advice given to you by your health care provider. Make sure you discuss any questions you have with your health care provider.  Spinal Cord Stimulator Replacement Care After This sheet gives you information about how to care for yourself after your procedure. Your health care provider may also give you more specific instructions. If you have problems or questions, contact your health care provider. What can I expect after the procedure? After the procedure, it is common to have: Soreness or pain. Some swelling in the area where the hardware was removed. A small amount of blood or clear fluid coming from your incision. Follow these instructions at home: If you have a cast: Do not stick anything inside the cast to scratch your skin. Doing that increases your risk of  infection. Check the skin around the cast every day. Tell your health care provider about any concerns. You may put lotion on dry skin around the edges of the cast. Do not put lotion on the skin underneath the cast. Keep the cast clean and dry. Do not take baths, swim, or use a hot tub until your health care provider approves. Ask your health care provider if you may take showers. You may only be allowed to take sponge baths. Keep the bandage (dressing) dry until your health care provider says it can be removed. Ok to shower in 5 days.    Incision care  Follow instructions from your health care provider about how to take care of your incision. Make sure you: Wash your hands with soap and water before you change your dressing. If soap and water are not available, use hand sanitizer. Change your dressing as told by your health care provider. Leave stitches (sutures), skin glue, or adhesive strips in place. These skin closures may need to stay in place for 2 weeks or longer. If adhesive strip edges start to  loosen and curl up, you may trim the loose edges. Do not remove adhesive strips completely unless your health care provider tells you to do that. Check your incision area every day for signs of infection. Check for: Redness. More swelling or pain. More fluid or blood. Warmth. Pus or a bad smell. Managing pain, stiffness, and swelling  If directed, put ice on the affected area: Put ice in a plastic bag. Place a towel between your skin and the bag. Leave the ice on for 20 minutes, 2-3 times a day. Move your fingers or toes often to avoid stiffness and to lessen swelling.  Driving Do not drive or use heavy machinery while taking prescription pain medicine. Do not drive for 24 hours if you were given a medicine to help you relax (sedative) during your procedure. Ask your health care provider when it is safe to drive if you have a cast, splint, or boot on the affected limb. Activity Ask  your health care provider what activities are safe for you during recovery, and ask what activities you need to avoid. Do not use the injured limb to support your body weight until your health care provider says that you can. Do not play contact sports until your health care provider approves. Do exercises as told by your health care provider. Avoid sitting for a long time without moving. Get up and move around at least every few hours. This will help prevent blood clots. General instructions Do not put pressure on any part of the cast or splint until it is fully hardened. This may take several hours. If you are taking prescription pain medicine, take actions to prevent or treat constipation. Your health care provider may recommend that you: Drink enough fluid to keep your urine pale yellow. Eat foods that are high in fiber, such as fresh fruits and vegetables, whole grains, and beans. Limit foods that are high in fat and processed sugars, such as fried or sweet foods. Take an over-the-counter or prescription medicine for constipation. Do not use any products that contain nicotine or tobacco, such as cigarettes and e-cigarettes. These can delay bone healing after surgery. If you need help quitting, ask your health care provider. Take over-the-counter and prescription medicines only as told by your health care provider. Keep all follow-up visits as told by your health care provider. This is important. Contact a health care provider if: You have lasting pain. You have redness around your incision. You have more swelling or pain around your incision. You have more fluid or blood coming from your incision. Your incision feels warm to the touch. You have pus or a bad smell coming from your incision. You are unable to do exercises or physical activity as told by your health care provider. Get help right away if: You have difficulty breathing. You have chest pain. You have severe pain. You have a  fever or chills. You have numbness for more than 24 hours in the area where the hardware was removed. Summary After the procedure, it is common to have some pain and swelling in the area where the hardware was removed. Follow instructions from your health care provider about how to take care of your incision. Return to your normal activities as told by your health care provider. Ask your health care provider what activities are safe for you. This information is not intended to replace advice given to you by your health care provider. Make sure you discuss any questions you have with your health  care provider.

## 2022-01-12 DIAGNOSIS — Z9682 Presence of neurostimulator: Secondary | ICD-10-CM | POA: Diagnosis not present

## 2022-01-12 DIAGNOSIS — M961 Postlaminectomy syndrome, not elsewhere classified: Secondary | ICD-10-CM | POA: Diagnosis not present

## 2022-01-12 DIAGNOSIS — Z8616 Personal history of COVID-19: Secondary | ICD-10-CM | POA: Diagnosis not present

## 2022-01-12 MED FILL — Thrombin For Soln 20000 Unit: CUTANEOUS | Qty: 1 | Status: AC

## 2022-01-12 NOTE — Discharge Summary (Signed)
Patient ID: [name] MRN: [id number] DOB/AGE: 1955-05-15 66 y.o.  Admit date: 01/11/2022 Discharge date: 01/12/2022  Admission Diagnoses:  Principal Problem:   Chronic pain   Discharge Diagnoses:  Principal Problem:   Chronic pain  status post Procedure(s): SPINAL CORD STIMULATOR PLACEMENT  Past Medical History:  Diagnosis Date   Arthritis    Cataract    bilateral cataracts   COVID-19    GERD (gastroesophageal reflux disease)    Microcytic anemia    Migraine    Scoliosis    Thalassemia    Thalassemia    Ulcerative pancolitis ([place])    Colonoscopy with Dr. Hilarie Fredrickson   Vitamin D deficiency     Surgeries: Procedure(s): [place] on 01/11/2022   Consultants:   Discharged Condition: Improved  Hospital Course: Ricardo Kayes is an 66 y.o. female who was admitted 01/11/2022 for operative treatment of Chronic pain. Patient failed conservative treatments (please see the history and physical for the specifics) and had severe unremitting pain that affects sleep, daily activities and work/hobbies. After pre-op clearance, the patient was taken to the operating room on 01/11/2022 and underwent  Procedure(s): [place].    Patient was given perioperative antibiotics:  Anti-infectives (From admission, onward)    Start     Dose/Rate Route Frequency Ordered Stop   01/11/22 1900  ceFAZolin (ANCEF) IVPB 1 g/50 mL premix        1 g 100 mL/hr over 30 Minutes Intravenous Every 8 hours 01/11/22 1801 01/12/22 0254   01/11/22 0915  ceFAZolin (ANCEF) IVPB 2g/100 mL premix  Status:  Discontinued        2 g 200 mL/hr over 30 Minutes Intravenous 30 min pre-op 01/11/22 0915 01/11/22 1722        Patient was given sequential compression devices and early ambulation to prevent DVT.   Patient benefited maximally from hospital stay and there were no complications. At the time of discharge, the patient was urinating/moving their bowels  without difficulty, tolerating a regular diet, pain is controlled with oral pain medications and they have been cleared by PT/OT.   Recent vital signs: Patient Vitals for the past 24 hrs:  BP Temp Temp src Pulse Resp SpO2 Height Weight  01/12/22 0328 131/72 98.7 F (37.1 C) Oral 94 18 93 % -- --  01/11/22 2302 126/79 98.1 F (36.7 C) Oral 88 18 95 % -- --  01/11/22 2013 (!) 148/91 98 F (36.7 C) Oral 81 20 95 % -- --  01/11/22 1645 130/73 97.8 F (36.6 C) -- 72 12 96 % -- --  01/11/22 1630 128/69 -- -- 73 11 96 % -- --  01/11/22 1615 116/60 -- -- 75 13 94 % -- --  01/11/22 1600 111/78 -- -- 71 13 94 % -- --  01/11/22 1545 138/83 97.8 F (36.6 C) -- 82 18 97 % -- --  01/11/22 0925 (!) 171/88 97.9 F (36.6 C) Oral 76 18 95 % 5' 2"  (1.575 m) 93.4 kg     Recent laboratory studies: No results for input(s): "WBC", "HGB", "HCT", "PLT", "NA", "K", "CL", "CO2", "BUN", "CREATININE", "GLUCOSE", "INR", "CALCIUM" in the last 72 hours.  Invalid input(s): "PT", "2"   Discharge Medications:   Allergies as of 01/12/2022   No Known Allergies      Medication List     STOP taking these medications    acetaminophen 500 MG tablet Commonly known as: TYLENOL   hydrocortisone cream 1 %   sulfaSALAzine  500 MG tablet Commonly known as: AZULFIDINE       TAKE these medications    calcium carbonate 500 MG chewable tablet Commonly known as: TUMS - dosed in mg elemental calcium Chew 1 tablet by mouth daily as needed for indigestion or heartburn.   carboxymethylcellulose 0.5 % Soln Commonly known as: REFRESH PLUS Place 1 drop into both eyes 3 (three) times daily as needed (dry eyes).   folic acid 1 MG tablet Commonly known as: FOLVITE TAKE 1 TABLET(1 MG) BY MOUTH DAILY   methocarbamol 500 MG tablet Commonly known as: ROBAXIN Take 1 tablet (500 mg total) by mouth every 8 (eight) hours as needed for up to 5 days for muscle spasms.   ondansetron 4 MG tablet Commonly known as:  Zofran Take 1 tablet (4 mg total) by mouth every 8 (eight) hours as needed for nausea or vomiting.   oxyCODONE-acetaminophen 10-325 MG tablet Commonly known as: Percocet Take 1 tablet by mouth every 6 (six) hours as needed for up to 5 days for pain.   pantoprazole 40 MG tablet Commonly known as: PROTONIX TAKE 1 TABLET (40 MG TOTAL) BY MOUTH 2 (TWO) TIMES DAILY. NEEDS OFFICE VISIT FOR FURTHER REFILLS   SUMAtriptan 100 MG tablet Commonly known as: IMITREX Take 1 tablet (100 mg total) by mouth every 2 (two) hours as needed for migraine.        Diagnostic Studies: DG THORACOLUMABAR SPINE  Result Date: 01/11/2022 CLINICAL DATA:  Portable fluoroscopic imaging for spine stimulator lead placement. EXAM: THORACOLUMBAR SPINE 15 views Fluoro time: 9 seconds.  Dose: 45.44 mGy. COMPARISON:  None Available. FINDINGS: Multiple portable fluoroscopic images of the thoracic spine shows placement of spine stimulator leads along the posterior aspect of the thoracic spinal canal. IMPRESSION: Portable fluoroscopic imaging provided for spine stimulator lead placement. Electronically Signed   By: Lajean Manes M.D.   On: 01/11/2022 15:28   DG C-Arm 1-60 Min-No Report  Result Date: 01/11/2022 Fluoroscopy was utilized by the requesting physician.  No radiographic interpretation.   DG C-Arm 1-60 Min-No Report  Result Date: 01/11/2022 Fluoroscopy was utilized by the requesting physician.  No radiographic interpretation.   DG C-Arm 1-60 Min-No Report  Result Date: 01/11/2022 Fluoroscopy was utilized by the requesting physician.  No radiographic interpretation.    Discharge Instructions     Incentive spirometry RT   Complete by: As directed          Discharge Plan:  discharge to home  Disposition: Patient is doing quite well status post spinal cord stimulator implantation.  She is ambulating, tolerating a regular diet, and voiding spontaneously.  She remains neurologically intact.  Incisions are  clean dry and intact with no drainage.  Patient will be discharged to home today as per our protocol.  She will follow-up with me in 2 weeks for wound check.  Instructions have been provided.    Signed: Dahlia Bailiff for Dr. Melina Schools [facility] [phone] 01/12/2022, 8:04 AM

## 2022-01-12 NOTE — Plan of Care (Signed)
  Problem: Education: Goal: Ability to verbalize activity precautions or restrictions will improve Outcome: Completed/Met Goal: Knowledge of the prescribed therapeutic regimen will improve Outcome: Completed/Met Goal: Understanding of discharge needs will improve Outcome: Completed/Met   

## 2022-01-12 NOTE — Evaluation (Signed)
Occupational Therapy Evaluation Patient Details Name: [name] MRN: [id number] DOB: [birth year] Today's Date: 01/12/2022   History of Present Illness 66 yo female s/p 11/27 spinal cord stimulator placement PMH arthritis, bil cataracts, scoliosis,thalassesmia, Vitamin D deficiency   Clinical Impression   Patient evaluated by Occupational Therapy with no further acute OT needs identified. All education has been completed and the patient has no further questions. See below for any follow-up Occupational Therapy or equipment needs. OT to sign off. Thank you for referral.        Recommendations for follow up therapy are one component of a multi-disciplinary discharge planning process, led by the attending physician.  Recommendations may be updated based on patient status, additional functional criteria and insurance authorization.   Follow Up Recommendations  No OT follow up     Assistance Recommended at Discharge None  Patient can return home with the following Assist for transportation    Functional Status Assessment  Patient has had a recent decline in their functional status and demonstrates the ability to make significant improvements in function in a reasonable and predictable amount of time.  Equipment Recommendations  None recommended by OT    Recommendations for Other Services       Precautions / Restrictions Precautions Precautions: Back Precaution Comments: reviewed back precautions and handout provided Restrictions Weight Bearing Restrictions: No      Mobility Bed Mobility Overal bed mobility: Modified Independent                  Transfers Overall transfer level: Needs assistance   Transfers: Sit to/from Stand Sit to Stand: Min guard           General transfer comment: pt completed x4 sit<>stand .pt with one sit,>Stand with quick descend to bed due to inability to sustain static stand immediately. pt repositioned herself and able to compelte  sit<>stand with improvement the second attempt      Balance Overall balance assessment: Mild deficits observed, not formally tested                                         ADL either performed or assessed with clinical judgement   ADL Overall ADL's : Modified independent                                       General ADL Comments: completed sink level grooming, toilet transfer, bed mobility, and dressing for home with figure 4 cross. pt advised to dress R LE first due to flexiability to cross  Back handout provided and reviewed adls in detail. Pt educated on:set an alarm at night for medication, avoid sitting for long periods of time, correct bed positioning for sleeping, correct sequence for bed mobility, avoiding lifting more than 5 pounds and never wash directly over incision. Photo taken of incision / dressing at this time on patient personal phone so that pt can see wound. Pt advised not to twist in mirror to see . All education is complete and patient indicates understanding.    Vision Patient Visual Report: No change from baseline       Perception     Praxis      Pertinent Vitals/Pain Pain Assessment Pain Assessment: Faces Faces Pain Scale: Hurts little more Pain Location: back Pain Descriptors / Indicators: Operative  site guarding Pain Intervention(s): Monitored during session, Premedicated before session, Repositioned     Hand Dominance Right   Extremity/Trunk Assessment Upper Extremity Assessment Upper Extremity Assessment: Overall WFL for tasks assessed   Lower Extremity Assessment Lower Extremity Assessment: Defer to PT evaluation   Cervical / Trunk Assessment Cervical / Trunk Assessment: Back Surgery   Communication Communication Communication: No difficulties   Cognition Arousal/Alertness: Awake/alert Behavior During Therapy: WFL for tasks assessed/performed Overall Cognitive Status: Within Functional Limits for tasks  assessed                                       General Comments  Reviewed precautions, pt verb understanding, able to teach back.    Exercises     Shoulder Instructions      Home Living Family/patient expects to be discharged to:: Private residence Living Arrangements: Alone Available Help at Discharge: Family;Available PRN/intermittently Type of Home: Apartment Home Access: Stairs to enter Entrance Stairs-Number of Steps: 14 - a landing with turn halfway up Entrance Stairs-Rails: Right;Left Home Layout: One level     Bathroom Shower/Tub: Teacher, early years/pre: Handicapped height Bathroom Accessibility: Yes   Home Equipment: Conservation officer, nature (2 wheels);Cane - single point;BSC/3in1;Grab bars - tub/shower (grab bars are suction cup)   Additional Comments: daughterx2 live within 5 minutes to (A) as needed      Prior Functioning/Environment Prior Level of Function : Independent/Modified Independent             Mobility Comments: Occasional use of SPC, denies falls          OT Problem List:        OT Treatment/Interventions:      OT Goals(Current goals can be found in the care plan section)    OT Frequency:      Co-evaluation              AM-PAC OT "6 Clicks" Daily Activity     Outcome Measure Help from another person eating meals?: None Help from another person taking care of personal grooming?: None Help from another person toileting, which includes using toliet, bedpan, or urinal?: None Help from another person bathing (including washing, rinsing, drying)?: None Help from another person to put on and taking off regular upper body clothing?: None Help from another person to put on and taking off regular lower body clothing?: None 6 Click Score: 24   End of Session Equipment Utilized During Treatment:  (cane) Nurse Communication: Mobility status;Precautions  Activity Tolerance: Patient tolerated treatment well Patient  left: in bed;with call bell/phone within reach  OT Visit Diagnosis: Unsteadiness on feet (R26.81)                Time: 3007-6226 OT Time Calculation (min): 16 min Charges:  OT General Charges $OT Visit: 1 Visit OT Evaluation $OT Eval Moderate Complexity: 1 Mod   Brynn, OTR/L  Acute Rehabilitation Services Office: [phone] .   Jeri Modena 01/12/2022, 9:55 AM

## 2022-01-12 NOTE — Progress Notes (Signed)
Patient alert and oriented, VSS, Void, pass gas. Surgical site clean and dry. D/c instructions explain and copy given to the patient ,all questions answered. Patient d/c home per order.

## 2022-01-12 NOTE — Evaluation (Signed)
Physical Therapy Evaluation Patient Details Name: [name] MRN: [id number] DOB: [birth year] Today's Date: 01/12/2022  History of Present Illness  66 y.o. female who was admitted 01/11/2022 for operative treatment of Chronic pain. Underwent procedure for spinal cord stimulator.  Clinical Impression  Patient evaluated by Physical Therapy with no further acute PT needs identified. All education has been completed and the patient has no further questions. Safely mobilizing with use of SPC (baseline). Mild gait abnormalities without concern for LOB. Safely navigated stairs similar to home set-up. Supervision for safety, did not require physical assistance throughout session. Education completed, pt reports she has family available to help as much as needed upon d/c. See below for any follow-up Physical Therapy or equipment needs. PT is signing off. Thank you for this referral.        Recommendations for follow up therapy are one component of a multi-disciplinary discharge planning process, led by the attending physician.  Recommendations may be updated based on patient status, additional functional criteria and insurance authorization.  Follow Up Recommendations No PT follow up      Assistance Recommended at Discharge Set up Supervision/Assistance  Patient can return home with the following  Assistance with cooking/housework;Assist for transportation;Help with stairs or ramp for entrance    Equipment Recommendations None recommended by PT  Recommendations for Other Services       Functional Status Assessment Patient has had a recent decline in their functional status and demonstrates the ability to make significant improvements in function in a reasonable and predictable amount of time.     Precautions / Restrictions Precautions Precautions: Back Precaution Comments: Pt verbalizes precautions, reports she has had several prior back opertations in the past and is very  familiar. Restrictions Weight Bearing Restrictions: No      Mobility  Bed Mobility Overal bed mobility: Needs Assistance Bed Mobility: Rolling, Sidelying to Sit, Sit to Sidelying Rolling: Supervision Sidelying to sit: Supervision     Sit to sidelying: Supervision General bed mobility comments: Good strength. Reviewed log roll technique. Did not require physical assist. A little effortful returning to bed but maintains precautions.    Transfers Overall transfer level: Needs assistance Equipment used: Straight cane Transfers: Sit to/from Stand Sit to Stand: Supervision           General transfer comment: supervision for safety. Slow to rise, practiced several times from bed. no physical assist required. Cues for technique.    Ambulation/Gait Ambulation/Gait assistance: Supervision Gait Distance (Feet): 240 Feet Assistive device: Straight cane Gait Pattern/deviations: Step-through pattern, Decreased stance time - left, Antalgic Gait velocity: decr Gait velocity interpretation: 1.31 - 2.62 ft/sec, indicative of limited community ambulator   General Gait Details: Minimally antalgic with slight decreased stance time on Lt. appropriately using SPC. No LOB noted. Supervision for safety.  Stairs Stairs: Yes Stairs assistance: Supervision Stair Management: One rail Left, Step to pattern, Forwards, With cane Number of Stairs: 13 General stair comments: Safely navigated stairs after education. Cues for sequencing appropriately, with step-to pattern.  Wheelchair Mobility    Modified Rankin (Stroke Patients Only)       Balance Overall balance assessment: Mild deficits observed, not formally tested                                           Pertinent Vitals/Pain Pain Assessment Pain Assessment: Faces Faces Pain Scale: Hurts little more Pain Location:  Back, minimal in LLE Pain Descriptors / Indicators: Aching Pain Intervention(s): Monitored during  session, Repositioned    Home Living Family/patient expects to be discharged to:: Private residence Living Arrangements: Alone Available Help at Discharge: Family;Available PRN/intermittently Type of Home: Apartment (Condo) Home Access: Stairs to enter Entrance Stairs-Rails: Right;Left Entrance Stairs-Number of Steps: 14 - a landing with turn halfway up   Home Layout: One level Home Equipment: Conservation officer, nature (2 wheels);Cane - single point      Prior Function Prior Level of Function : Independent/Modified Independent             Mobility Comments: Occasional use of SPC, denies falls       Hand Dominance        Extremity/Trunk Assessment   Upper Extremity Assessment Upper Extremity Assessment: Defer to OT evaluation    Lower Extremity Assessment Lower Extremity Assessment: Generalized weakness       Communication   Communication: No difficulties  Cognition Arousal/Alertness: Awake/alert Behavior During Therapy: WFL for tasks assessed/performed Overall Cognitive Status: Within Functional Limits for tasks assessed                                          General Comments General comments (skin integrity, edema, etc.): Reviewed precautions, pt verb understanding, able to teach back.    Exercises     Assessment/Plan    PT Assessment Patient does not need any further PT services  PT Problem List         PT Treatment Interventions      PT Goals (Current goals can be found in the Care Plan section)  Acute Rehab PT Goals Patient Stated Goal: go home PT Goal Formulation: All assessment and education complete, DC therapy    Frequency       Co-evaluation               AM-PAC PT "6 Clicks" Mobility  Outcome Measure Help needed turning from your back to your side while in a flat bed without using bedrails?: None Help needed moving from lying on your back to sitting on the side of a flat bed without using bedrails?: None Help  needed moving to and from a bed to a chair (including a wheelchair)?: A Little Help needed standing up from a chair using your arms (e.g., wheelchair or bedside chair)?: A Little Help needed to walk in hospital room?: A Little Help needed climbing 3-5 steps with a railing? : A Little 6 Click Score: 20    End of Session Equipment Utilized During Treatment: Gait belt Activity Tolerance: Patient tolerated treatment well Patient left: in bed;with call bell/phone within reach   PT Visit Diagnosis: Other abnormalities of gait and mobility (R26.89);Muscle weakness (generalized) (M62.81);Pain Pain - part of body:  (back)    Time: 9407-6808 PT Time Calculation (min) (ACUTE ONLY): 12 min   Charges:   PT Evaluation $PT Eval Low Complexity: 1 Low          Candie Mile, PT, DPT Physical Therapist Acute Rehabilitation Services [place] 01/12/2022, 9:40 AM

## 2022-01-13 ENCOUNTER — Encounter (HOSPITAL_COMMUNITY): Payer: Self-pay | Admitting: Orthopedic Surgery

## 2022-02-09 ENCOUNTER — Encounter: Payer: Self-pay | Admitting: Internal Medicine

## 2022-02-09 ENCOUNTER — Ambulatory Visit: Payer: Medicare Other | Admitting: Internal Medicine

## 2022-02-09 VITALS — BP 126/80 | HR 79 | Ht 62.0 in | Wt 204.0 lb

## 2022-02-09 DIAGNOSIS — K2970 Gastritis, unspecified, without bleeding: Secondary | ICD-10-CM

## 2022-02-09 DIAGNOSIS — K5100 Ulcerative (chronic) pancolitis without complications: Secondary | ICD-10-CM | POA: Diagnosis not present

## 2022-02-09 MED ORDER — PANTOPRAZOLE SODIUM 40 MG PO TBEC: 40.0000 mg | DELAYED_RELEASE_TABLET | Freq: Every day | ORAL | 0 refills | Status: DC

## 2022-02-09 NOTE — Patient Instructions (Signed)
Restart Sulfasalazine 1 gram twice daily  Restart Folic Acid 0076 mg daily.  Decrease pantoprazole to 40 mg once daily dosing.  Follow up with Dr Hilarie Fredrickson in 1 year.  You will be due for a recall colonoscopy in 06/2023. We will send you a reminder in the mail when it gets closer to that time.  _______________________________________________________  If you are age 64 or older, your body mass index should be between 23-30. Your Body mass index is 37.31 kg/m. If this is out of the aforementioned range listed, please consider follow up with your Primary Care Provider.  If you are age 10 or younger, your body mass index should be between 19-25. Your Body mass index is 37.31 kg/m. If this is out of the aformentioned range listed, please consider follow up with your Primary Care Provider.   ________________________________________________________  The [place] [facility] providers would like to encourage you to use [facility] to communicate with providers for non-urgent requests or questions.  Due to long hold times on the telephone, sending your provider a message by [facility] may be a faster and more efficient way to get a response.  Please allow 48 business hours for a response.  Please remember that this is for non-urgent requests.  _______________________________________________________  Due to recent changes in healthcare laws, you may see the results of your imaging and laboratory studies on MyChart before your provider has had a chance to review them.  We understand that in some cases there may be results that are confusing or concerning to you. Not all laboratory results come back in the same time frame and the provider may be waiting for multiple results in order to interpret others.  Please give [place] 48 hours in order for your provider to thoroughly review all the results before contacting the office for clarification of your results.

## 2022-02-09 NOTE — Progress Notes (Signed)
Subjective:    Patient ID: [name], female    DOB: February 23, 1955, 66 y.o.   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 pylori, degenerative disc disease, arthritis, migraines and mild asthma who is here for follow-up.  She is here alone today and I last saw her for upper and lower endoscopy in May 2022.  EGD revealed no esophageal cause for dysphagia but Venia Minks dilation was performed to 50 Fr. gastritis was seen and biopsies showed ulcerative gastritis without H. pylori or dysplasia.  Colonoscopy revealed Mayo score 0 and internal hemorrhoids.  Biopsies showed no pathologic inflammation.  She recently had a spinal nerve stimulator placed in 2 weeks ago this was turned on at level 1.  She is using as needed Percocet at this point.  Sulfasalazine was stopped before her back surgery and has not been resumed.  She is nervous because she does not want her colitis to flare.  She has been using twice daily pantoprazole.  She reports no bowel complaint including diarrhea, blood in stool or mucus.  No abdominal pain.  Swallowing improved after the dilation.  No recent heartburn.  No nausea or vomiting.  No epigastric pain.   Review of Systems As per HPI, otherwise negative  Current Medications, Allergies, Past Medical History, Past Surgical History, Family History and Social History were reviewed in [employer] record.    Objective:   Physical Exam BP 126/80   Pulse 79   Ht 5' 2"  (1.575 m)   Wt 204 lb (92.5 kg)   SpO2 97%   BMI 37.31 kg/m  Gen: awake, alert, NAD HEENT: anicteric Neuro: nonfocal     Latest Ref Rng & Units 12/17/2021    8:51 AM 11/14/2020    3:14 AM 10/31/2020    9:38 AM  CBC  WBC 4.0 - 10.5 K/uL 5.6  12.2  5.5   Hemoglobin 12.0 - 15.0 g/dL 12.8  9.9  11.6   Hematocrit  36.0 - 46.0 % 40.3  32.4  37.3   Platelets 150.0 - 400.0 K/uL 309.0 Repeated and verified X2.  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 pylori, degenerative disc disease, arthritis, migraines and mild asthma who is here for follow-up.  Pan ulcerative colitis (dx > 30 yrs) --in endoscopic and histologic remission as of May 2022.  Sulfasalazine was stopped presumably perioperatively by surgery.  Not yet restarted.  I do fear that this will resurface and become symptomatic again if we  do not get her back on the therapy that was working very well for her.  She is in agreement. -- Resume sulfasalazine 1 g twice daily; this needs to be taken with folate 1 mg daily -- Given endoscopic remission without adenomatous polyps I am okay extending recall colonoscopy for 1 year but recommend repeat exam in May 2025 -- Office follow-up in about a year  2.  Esophageal dysphagia and erosive gastritis --likely NSAID related in the setting of her degenerative disc disease.  No recurrent dysphagia after dilation.  This can be repeated as needed -- Reduce pantoprazole to 40 mg once daily for gastric protection  30 minutes total spent today including patient facing time, coordination of care, reviewing medical  history/procedures/pertinent radiology studies, and documentation of the encounter.

## 2022-03-08 ENCOUNTER — Telehealth: Payer: Self-pay | Admitting: Family Medicine

## 2022-03-08 MED ORDER — SUMATRIPTAN SUCCINATE 100 MG PO TABS: 100.0000 mg | ORAL_TABLET | ORAL | 6 refills | Status: DC | PRN

## 2022-03-08 NOTE — Telephone Encounter (Signed)
Patient is requesting a refill of the following medications: Requested Prescriptions   Pending Prescriptions Disp Refills   SUMAtriptan (IMITREX) 100 MG tablet 10 tablet 6    Sig: Take 1 tablet (100 mg total) by mouth every 2 (two) hours as needed for migraine.    Date of patient request: 03/08/22 Last office visit: 12/17/21 Date of last refill: 04/11/20 Last refill amount: 10x6

## 2022-03-08 NOTE — Telephone Encounter (Signed)
Encourage patient to contact the pharmacy for refills or they can request refills through [facility]  (Please schedule appointment if patient has not been seen in over a year)  Last OV 01/07/22   WHAT PHARMACY WOULD THEY LIKE THIS SENT TO: [id number] [place], [place] - [address] AT [address] 100 MG tablet       F[facility]emoffice please notify patient: It takes 48-72 hours to process rx refill requests Ask patient to call pharmacy to ensure rx is ready before heading there.

## 2022-03-12 ENCOUNTER — Telehealth (INDEPENDENT_AMBULATORY_CARE_PROVIDER_SITE_OTHER): Payer: Medicare Other | Admitting: Family Medicine

## 2022-03-12 ENCOUNTER — Encounter: Payer: Self-pay | Admitting: Family Medicine

## 2022-03-12 VITALS — Wt 206.0 lb

## 2022-03-12 DIAGNOSIS — J069 Acute upper respiratory infection, unspecified: Secondary | ICD-10-CM | POA: Diagnosis not present

## 2022-03-12 NOTE — Progress Notes (Signed)
Virtual Visit via Video   I connected with patient on 03/12/22 at  1:20 PM EST by a video enabled telemedicine application and verified that I am speaking with the correct person using two identifiers.  Location patient: Home Location provider: [facility], Office Persons participating in the virtual visit: Patient, Provider, [place] Marcille Blanco C)  I discussed the limitations of evaluation and management by telemedicine and the availability of in person appointments. The patient expressed understanding and agreed to proceed.  Subjective:   HPI:   URI- pt reports 6 days ago w/ headache.  Then developed cough and congestion.  Started Mucinex DM and took Imitrex.  Spent the whole day in bed on Sunday.  + chest congestion.  Cough is worse at night.  Cough is not productive.  Denies sinus pain/pressure.  No ear pain, had mild sore throat but this is improving.  Pt reports feeling better today.  ROS:   See pertinent positives and negatives per HPI.  Patient Active Problem List   Diagnosis Date Noted   Chronic pain 01/11/2022   Left leg pain 04/23/2021   S/P total knee arthroplasty, right 11/13/2020   Status post bilateral breast reduction 06/23/2017   Morbid obesity ([place]) 11/08/2016   Vitamin D deficiency 11/08/2016   Back pain 01/28/2016   S/P left TKA 01/14/2015   Pain in thoracic spine 04/05/2014   Beta thalassemia, heterozygous 03/11/2014   Right lumbar radiculopathy 11/23/2013   Ulcerative colitis, universal ([place]) 07/25/2012   Physical exam 05/19/2012   THYROIDITIS 01/27/2010   MIGRAINE HEADACHE 01/27/2010   PALPITATIONS 12/31/2009    Social History   Tobacco Use   Smoking status: Never   Smokeless tobacco: Never  Substance Use Topics   Alcohol use: No    Alcohol/week: 0.0 standard drinks of alcohol    Current Outpatient Medications:    carboxymethylcellulose (REFRESH PLUS) 0.5 % SOLN, Place 1 drop into both eyes 3 (three) times daily as needed (dry eyes).,  Disp: , Rfl:    folic acid (FOLVITE) 1 MG tablet, TAKE 1 TABLET(1 MG) BY MOUTH DAILY, Disp: 90 tablet, Rfl: 1   pantoprazole (PROTONIX) 40 MG tablet, Take 1 tablet (40 mg total) by mouth daily. NEEDS OFFICE VISIT FOR FURTHER REFILLS, Disp: 1 tablet, Rfl: 0   pregabalin (LYRICA) 75 MG capsule, Take 75 mg by mouth 2 (two) times daily., Disp: , Rfl:    SUMAtriptan (IMITREX) 100 MG tablet, Take 1 tablet (100 mg total) by mouth every 2 (two) hours as needed for migraine., Disp: 10 tablet, Rfl: 6   calcium carbonate (TUMS - DOSED IN MG ELEMENTAL CALCIUM) 500 MG chewable tablet, Chew 1 tablet by mouth daily as needed for indigestion or heartburn. (Patient not taking: Reported on 03/12/2022), Disp: , Rfl:    methocarbamol (ROBAXIN) 500 MG tablet, Take 500 mg by mouth every 8 (eight) hours as needed. (Patient not taking: Reported on 03/12/2022), Disp: , Rfl:    ondansetron (ZOFRAN) 4 MG tablet, Take 1 tablet (4 mg total) by mouth every 8 (eight) hours as needed for nausea or vomiting. (Patient not taking: Reported on 03/12/2022), Disp: 20 tablet, Rfl: 0   PERCOCET 10-325 MG tablet, 1 tablet every 6 (six) hours as needed. (Patient not taking: Reported on 03/12/2022), Disp: , Rfl:   No Known Allergies  Objective:   Wt 206 lb (93.4 kg)   BMI 37.68 kg/m  AAOx3, NAD NCAT, EOMI No obvious CN deficits Coloring WNL Pt is able to speak clearly, coherently without  shortness of breath or increased work of breathing.  Thought process is linear.  Mood is appropriate.   Assessment and Plan:   Viral URI- improving.  Pt's sxs started 6 days and were initially severe but have started to improve w/ time.  Today she reports feeling better.  Continues to cough- worsens at night.  Encouraged her to add Delsym to the Mucinex she is taking.  Reviewed supportive care and red flags that should prompt return.  Pt expressed understanding and is in agreement w/ plan.    [name], MD 03/12/2022

## 2022-03-12 NOTE — Telephone Encounter (Signed)
Called and updated patient, she will call back if pharmacy does not have this

## 2022-03-12 NOTE — Telephone Encounter (Signed)
Patient called back to get a update on this refill request

## 2022-03-23 ENCOUNTER — Telehealth: Payer: Self-pay | Admitting: Pharmacist

## 2022-03-23 NOTE — Progress Notes (Signed)
Care Management & Coordination Services Pharmacy Team   Reason for Encounter: General adherence update    Contacted patient for general health update and medication adherence call.  Spoke with patient on 04/15/2022    What concerns do you have about your medications? No  The patient denies side effects with their medications.   How often do you forget or accidentally miss a dose? Rarely  Do you use a pillbox? No  Are you having any problems getting your medications from your pharmacy? No  Has the cost of your medications been a concern? No If yes, what medication and is patient assistance available or has it been applied for?  Since last visit with PharmD, no interventions have been made.   The patient has not had an ED visit since last contact.   The patient denies problems with their health.   Patient reports the following concerns or questions for Lyrica refill, PharmD at this time. She states ortho had prescribed it but wanted to know if Dr Birdie Riddle may be willing to do her refills so she wouldn't have to keep going through them for it.   Counseled patient on: Great job taking medications   Chart Updates:  Recent office visits:  03/12/22 [name], MD - Family Medicine - Nasal congestion - OTC remedies discussed. Follow up as needed.   Recent consult visits:  02/09/22 Zenovia Jarred MD - Gastroenterology - Gastritis - Restart Sulfasalazine 1 gram twice daily. Restart Folic Acid 123XX123 mg daily. Decrease pantoprazole to 40 mg once daily dosing. Colonscopy due 06/2023. Follow up in 1 year.   Hospital visits: 01/11/22 - 01/12/22  Admitted 01/11/22 for Chronic pain to [facility]. Discharged on 01/12/22  No medications were changed or stopped   Medications: Outpatient Encounter Medications as of 03/23/2022  Medication Sig   calcium carbonate (TUMS - DOSED IN MG ELEMENTAL CALCIUM) 500 MG chewable tablet Chew 1 tablet by mouth daily as needed for indigestion or  heartburn. (Patient not taking: Reported on 03/12/2022)   carboxymethylcellulose (REFRESH PLUS) 0.5 % SOLN Place 1 drop into both eyes 3 (three) times daily as needed (dry eyes).   folic acid (FOLVITE) 1 MG tablet TAKE 1 TABLET(1 MG) BY MOUTH DAILY   methocarbamol (ROBAXIN) 500 MG tablet Take 500 mg by mouth every 8 (eight) hours as needed. (Patient not taking: Reported on 03/12/2022)   ondansetron (ZOFRAN) 4 MG tablet Take 1 tablet (4 mg total) by mouth every 8 (eight) hours as needed for nausea or vomiting. (Patient not taking: Reported on 03/12/2022)   pantoprazole (PROTONIX) 40 MG tablet Take 1 tablet (40 mg total) by mouth daily. NEEDS OFFICE VISIT FOR FURTHER REFILLS   PERCOCET 10-325 MG tablet 1 tablet every 6 (six) hours as needed. (Patient not taking: Reported on 03/12/2022)   pregabalin (LYRICA) 75 MG capsule Take 75 mg by mouth 2 (two) times daily.   SUMAtriptan (IMITREX) 100 MG tablet Take 1 tablet (100 mg total) by mouth every 2 (two) hours as needed for migraine.   No facility-administered encounter medications on file as of 03/23/2022.    Recent vitals BP Readings from Last 3 Encounters:  02/09/22 126/80  01/12/22 131/72  01/04/22 133/81   Pulse Readings from Last 3 Encounters:  02/09/22 79  01/12/22 94  01/04/22 88   Wt Readings from Last 3 Encounters:  03/12/22 206 lb (93.4 kg)  02/09/22 204 lb (92.5 kg)  01/11/22 206 lb (93.4 kg)   BMI Readings from Last 3  Encounters:  03/12/22 37.68 kg/m  02/09/22 37.31 kg/m  01/11/22 37.68 kg/m    Recent lab results    Component Value Date/Time   NA 141 12/17/2021 0851   K 4.8 12/17/2021 0851   CL 103 12/17/2021 0851   CO2 30 12/17/2021 0851   GLUCOSE 77 12/17/2021 0851   BUN 11 12/17/2021 0851   CREATININE 0.62 12/17/2021 0851   CREATININE 0.75 05/13/2017 1424   CALCIUM 10.0 12/17/2021 0851    Lab Results  Component Value Date   CREATININE 0.62 12/17/2021   GFR 94.37 12/17/2021   GFRNONAA >60 11/14/2020   GFRAA  >60 01/20/2016   No results found for: "HGBA1C", "FRUCTOSAMINE", "MICROALBUR"  Lab Results  Component Value Date   CHOL 192 12/17/2021   HDL 73.20 12/17/2021   LDLCALC 104 (H) 12/17/2021   TRIG 72.0 12/17/2021   CHOLHDL 3 12/17/2021    Care Gaps: Annual wellness visit in last year? Yes  If Diabetic: Last eye exam / retinopathy screening: Last diabetic foot exam: Last UACR:    Star Rating Drugs:  Medication:  Last Fill: Day Supply  No Star Rating Drugs noted   Future Appointments  Date Time Provider [place]  06/30/2022 12:00 PM Edythe Clarity, [place], [place]

## 2022-04-04 ENCOUNTER — Other Ambulatory Visit: Payer: Self-pay | Admitting: Internal Medicine

## 2022-04-04 DIAGNOSIS — K5100 Ulcerative (chronic) pancolitis without complications: Secondary | ICD-10-CM

## 2022-05-31 ENCOUNTER — Encounter: Payer: Self-pay | Admitting: *Deleted

## 2022-06-17 DIAGNOSIS — Z4889 Encounter for other specified surgical aftercare: Secondary | ICD-10-CM | POA: Diagnosis not present

## 2022-06-30 ENCOUNTER — Encounter: Payer: Medicare Other | Admitting: Pharmacist

## 2022-06-30 DIAGNOSIS — M5416 Radiculopathy, lumbar region: Secondary | ICD-10-CM | POA: Diagnosis not present

## 2022-07-06 ENCOUNTER — Telehealth: Payer: Self-pay | Admitting: Pharmacist

## 2022-07-06 NOTE — Progress Notes (Signed)
Care Management & Coordination Services Pharmacy Team   Reason for Encounter: Appointment Reminder   Contacted patient to confirm telephone appointment with Erskine Emery, PharmD on 07/07/22 at 9:30AM. Spoke with patient on 07/06/2022       Care Gaps: Annual wellness visit in last year? Yes done 08/21/22  If Diabetic: N/A Last eye exam / retinopathy screening: Last diabetic foot exam:   Future Appointments  Date Time Provider Department Center  07/07/2022  9:30 AM Erroll Luna, [facility] None    [employer], [address]

## 2022-07-07 ENCOUNTER — Ambulatory Visit: Payer: Medicare Other | Admitting: Pharmacist

## 2022-07-07 NOTE — Progress Notes (Signed)
Care Management & Coordination Services Pharmacy Note  07/07/2022 Name:  [name] MRN:  [id number] DOB:  [birth year]  Summary: PharmD FU visit.  Patient still having back pain and issues with stimulator device in her back.  Now on Lyrica which is giving her some relief at night to get some sleep.  Recommendations/Changes made from today's visit: No changes  Follow up plan: No FU needed at this tim   Subjective: [name] is an 67 y.o. year old female who is a primary patient of Beverely Low, Helane Rima, MD.  The care coordination team was consulted for assistance with disease management and care coordination needs.    Engaged with patient by telephone for follow up visit.  Recent office visits:  03/12/22 Neena Rhymes, MD - Family Medicine - Nasal congestion - OTC remedies discussed. Follow up as needed.    Recent consult visits:  02/09/22 Erick Blinks MD - Gastroenterology - Gastritis - Restart Sulfasalazine 1 gram twice daily. Restart Folic Acid 1000 mg daily. Decrease pantoprazole to 40 mg once daily dosing. Colonscopy due 06/2023. Follow up in 1 year.    Hospital visits: 01/11/22 - 01/12/22   Admitted 01/11/22 for Chronic pain to [facility]. Discharged on 01/12/22   No medications were changed or stopped   Objective:  Lab Results  Component Value Date   CREATININE 0.62 12/17/2021   BUN 11 12/17/2021   GFR 94.37 12/17/2021   GFRNONAA >60 11/14/2020   GFRAA >60 01/20/2016   NA 141 12/17/2021   K 4.8 12/17/2021   CALCIUM 10.0 12/17/2021   CO2 30 12/17/2021   GLUCOSE 77 12/17/2021    Lab Results  Component Value Date/Time   GFR 94.37 12/17/2021 08:51 AM   GFR 79.99 09/04/2020 10:59 AM    Last diabetic Eye exam: No results found for: "HMDIABEYEEXA"  Last diabetic Foot exam: No results found for: "HMDIABFOOTEX"   Lab Results  Component Value Date   CHOL 192 12/17/2021   HDL 73.20 12/17/2021   LDLCALC 104 (H) 12/17/2021   TRIG 72.0 12/17/2021    CHOLHDL 3 12/17/2021       Latest Ref Rng & Units 12/17/2021    8:51 AM 10/31/2020    9:38 AM 09/04/2020   10:59 AM  Hepatic Function  Total Protein 6.0 - 8.3 g/dL 8.4  7.7  7.2   Albumin 3.5 - 5.2 g/dL 4.4  4.0  3.9   AST 0 - 37 U/L 22  26  17    ALT 0 - 35 U/L 14  13  13    Alk Phosphatase 39 - 117 U/L 81  59  70   Total Bilirubin 0.2 - 1.2 mg/dL 0.6  0.5  0.5   Bilirubin, Direct 0.0 - 0.3 mg/dL 0.1   0.1     Lab Results  Component Value Date/Time   TSH 1.06 12/17/2021 08:51 AM   TSH 1.02 09/04/2020 10:59 AM       Latest Ref Rng & Units 12/17/2021    8:51 AM 11/14/2020    3:14 AM 10/31/2020    9:38 AM  CBC  WBC 4.0 - 10.5 K/uL 5.6  12.2  5.5   Hemoglobin 12.0 - 15.0 g/dL 78.2  9.9  95.6   Hematocrit 36.0 - 46.0 % 40.3  32.4  37.3   Platelets 150.0 - 400.0 K/uL 309.0 Repeated and verified X2.  257  301     Lab Results  Component Value Date/Time   VD25OH 11.80 (L) 04/14/2020 09:46 AM  VD25OH 15 (L) 11/08/2016 04:13 PM   VD25OH 20 (L) 05/09/2015 04:04 PM   VD25OH 18.02 (L) 10/24/2013 09:38 AM   VITAMINB12 306 07/16/2013 11:40 AM    Clinical ASCVD: No  The 10-year ASCVD risk score (Arnett DK, et al., 2019) is: 6.2%   Values used to calculate the score:     Age: 67 years     Sex: Female     Is Non-Hispanic African American: Yes     Diabetic: No     Tobacco smoker: No     Systolic Blood Pressure: 126 mmHg     Is BP treated: No     HDL Cholesterol: 73.2 mg/dL     Total Cholesterol: 192 mg/dL       9/62/9528    4:13 PM 12/17/2021    8:23 AM 09/21/2021   10:55 AM  Depression screen PHQ 2/9  Decreased Interest 0 1 1  Down, Depressed, Hopeless 0 0 0  PHQ - 2 Score 0 1 1  Altered sleeping 0 2 2  Tired, decreased energy 0 1 2  Change in appetite 0 1 1  Feeling bad or failure about yourself  0 0 0  Trouble concentrating 0 0 1  Moving slowly or fidgety/restless 0 0 0  Suicidal thoughts 0 0 0  PHQ-9 Score 0 5 7  Difficult doing work/chores Not difficult at all   Not difficult at all     Social History   Tobacco Use  Smoking Status Never  Smokeless Tobacco Never   BP Readings from Last 3 Encounters:  02/09/22 126/80  01/12/22 131/72  01/04/22 133/81   Pulse Readings from Last 3 Encounters:  02/09/22 79  01/12/22 94  01/04/22 88   Wt Readings from Last 3 Encounters:  03/12/22 206 lb (93.4 kg)  02/09/22 204 lb (92.5 kg)  01/11/22 206 lb (93.4 kg)   BMI Readings from Last 3 Encounters:  03/12/22 37.68 kg/m  02/09/22 37.31 kg/m  01/11/22 37.68 kg/m    No Known Allergies  Medications Reviewed Today     Reviewed by Erroll Luna, [facility] (Pharmacist) on 07/07/22 at 1005  Med List Status: <None>   Medication Order Taking? Sig Documenting Provider Last Dose Status Informant  calcium carbonate (TUMS - DOSED IN MG ELEMENTAL CALCIUM) 500 MG chewable tablet [id number] No Chew 1 tablet by mouth daily as needed for indigestion or heartburn.  Patient not taking: Reported on 03/12/2022   [provider] Not Taking Active Self  carboxymethylcellulose (REFRESH PLUS) 0.5 % SOLN [id number] No Place 1 drop into both eyes 3 (three) times daily as needed (dry eyes). [provider] Taking Active Self  folic acid (FOLVITE) 1 MG tablet [id number] No TAKE 1 TABLET(1 MG) BY MOUTH DAILY Pyrtle, Carie Caddy, MD Taking Active Self  methocarbamol (ROBAXIN) 500 MG tablet [id number] No Take 500 mg by mouth every 8 (eight) hours as needed.  Patient not taking: Reported on 03/12/2022   [provider] Not Taking Active   ondansetron (ZOFRAN) 4 MG tablet [id number] No Take 1 tablet (4 mg total) by mouth every 8 (eight) hours as needed for nausea or vomiting.  Patient not taking: Reported on 03/12/2022   Venita Lick, MD Not Taking Active   pantoprazole (PROTONIX) 40 MG tablet [id number]  TAKE 1 TABLET (40 MG TOTAL) BY MOUTH 2 (TWO) TIMES DAILY. NEEDS OFFICE VISIT FOR FURTHER REFILLS Pyrtle, Carie Caddy, MD  Active   PERCOCET 10-325 MG tablet  [id number] No 1 tablet  every 6 (six) hours as needed.  Patient not taking: Reported on 03/12/2022   [provider] Not Taking Active   pregabalin (LYRICA) 75 MG capsule [id number] No Take 75 mg by mouth 2 (two) times daily. [provider] Taking Active   SUMAtriptan (IMITREX) 100 MG tablet [id number] No Take 1 tablet (100 mg total) by mouth every 2 (two) hours as needed for migraine. Sheliah Hatch, MD Taking Active             SDOH:  (Social Determinants of Health) assessments and interventions performed: No Financial Resource Strain: Low Risk  (08/20/2021)   Overall Financial Resource Strain (CARDIA)    Difficulty of Paying Living Expenses: Not hard at all   Food Insecurity: No Food Insecurity (08/20/2021)   Hunger Vital Sign    Worried About Running Out of Food in the Last Year: Never true    Ran Out of Food in the Last Year: Never true    SDOH Interventions    Flowsheet Row Clinical Support from 08/20/2021 in [facility] [employer]  SDOH Interventions   Housing Interventions Intervention Not Indicated  Transportation Interventions Intervention Not Indicated  Financial Strain Interventions Intervention Not Indicated  Physical Activity Interventions Intervention Not Indicated  Stress Interventions Intervention Not Indicated  Social Connections Interventions Intervention Not Indicated       Medication Assistance: None required.  Patient affirms current coverage meets needs.  Medication Access: Name and location of current pharmacy:  [id number] - [place], [place] - [address] [place] [address] Phone: [phone] Fax: [phone]  [facility] [place] [facility] [address] [place] [address] Phone: [phone] Fax: [phone]  Within the past 30 days, how often has patient missed a dose of medication?  0 Is a pillbox or other method used to improve adherence? No  Factors that may affect medication adherence? no barriers identified Are meds synced by current pharmacy? No  Are meds delivered by current pharmacy? No  Does patient experience delays in picking up medications due to transportation concerns? No   Compliance/Adherence/Medication fill history: Care Gaps: Care Gaps: Annual wellness visit in last year? Yes done 08/21/22  Star-Rating Drugs: None   Assessment/Plan     Asthma (Goal: control symptoms and prevent exacerbations) -Controlled -Current treatment  No current therapies  -Medications previously tried: none on file  -Pulmonary function testing: n/a -Exacerbations requiring treatment in last 6 months: 0 -Patient denies consistent use of maintenance inhaler -Frequency of rescue inhaler use: 0 -Counseled on When to use rescue inhaler -No rx changes   Osteoarthritis/Back pain (Goal: ensure safe medication use) 07/07/22 -Not ideally controlled -Current treatment  Tylenol 8 Hour 650 mg tablet,extended release as needed Appropriate, Effective, Safe, Accessible Lyrica 75mg  hs Appropriate, Query effective, ,  -Medications previously tried: diclofenac (stopped due to GI risks)  -Recent complications with nerve stimulator in back.  She was prescribed Lyrica temporarily and is still taking this to help her get some relief at night.  This has helped so far. Plans to FU with ortho on next steps for back.  Just continue to follow for now.  No additional follow up with me needed at this time.  Provided my number in case she needs to reach [place] or have any questions.  Update 07/01/21 She is still recovering from knee surgery last September. Mobility is so so.  She  compensates with other leg and she feels this is causing some pain/discomfort in the other knee. Continues to only take Tylenol for the pain. Plan to continue to rehab. Was having some swelling but no evidence of blood  clots  - referred to sports medicine for eval.   GERD/UC (minimize symptoms) -Controlled -Sees Dr Rhea Belton for UC  -Previously taking omeprazole OTC, switched to pantoprazole Rx. Aware of GI triggers and understands to avoid NSAIDs - Tylenol is her first line medication.  -Current regimen Pantoprazole 40 mg twice daily (GERD) Appropriate, Effective, Safe, Accessible Sulfasalazine 500 mg - 2 tabs twice daily (UC) Appropriate, Effective, Safe, Accessible -Reviewed concerns/side effects - no problems noted -No Rx changes  Update 07/01/21 Continues on same medications. Still followed by Dr. Rhea Belton. NO changes at this time medications still working well and are affordable.   Health Maintenance -Vaccine gaps: covid booster (plans to get after surgery), influenza (does not receive) -Educated on Vitamin D 3 use - agreeable to picking up 1000 units and taking daily -Goal of weight loss following knee surgery - may consider silver sneakers -Counseled on diet and exercise extensively          Willa Frater, PharmD Clinical Pharmacist  [facility] [phone]

## 2022-07-28 ENCOUNTER — Ambulatory Visit (INDEPENDENT_AMBULATORY_CARE_PROVIDER_SITE_OTHER): Payer: Medicare Other | Admitting: *Deleted

## 2022-07-28 ENCOUNTER — Telehealth: Payer: Self-pay | Admitting: Family Medicine

## 2022-07-28 DIAGNOSIS — Z0000 Encounter for general adult medical examination without abnormal findings: Secondary | ICD-10-CM

## 2022-07-28 NOTE — Telephone Encounter (Signed)
Placed in Dr Tabori to be reviewed folder 

## 2022-07-28 NOTE — Patient Instructions (Signed)
[name] , Thank you for taking time to come for your Medicare Wellness Visit. I appreciate your ongoing commitment to your health goals. Please review the following plan we discussed and let me know if I can assist you in the future.   Screening recommendations/referrals: Colonoscopy: up to date Mammogram: Education provided  Recommended yearly ophthalmology/optometry visit for glaucoma screening and checkup Recommended yearly dental visit for hygiene and checkup  Vaccinations: Influenza vaccine: Education provided Tdap vaccine: up to date Shingles vaccine: Education provided    Advanced directives: Education provided    Preventive Care , Female Preventive care refers to lifestyle choices and visits with your health care provider that can promote health and wellness. What does preventive care include? A yearly physical exam. This is also called an annual well check. Dental exams once or twice a year. Routine eye exams. Ask your health care provider how often you should have your eyes checked. Personal lifestyle choices, including: Daily care of your teeth and gums. Regular physical activity. Eating a healthy diet. Avoiding tobacco and drug use. Limiting alcohol use. Practicing safe sex. Taking low-dose aspirin every day. Taking vitamin and mineral supplements as recommended by your health care provider. What happens during an annual well check? The services and screenings done by your health care provider during your annual well check will depend on your age, overall health, lifestyle risk factors, and family history of disease. Counseling  Your health care provider may ask you questions about your: Alcohol use. Tobacco use. Drug use. Emotional well-being. Home and relationship well-being. Sexual activity. Eating habits. History of falls. Memory and ability to understand (cognition). Work and work Astronomer. Reproductive health. Screening  You may have the  following tests or measurements: Height, weight, and BMI. Blood pressure. Lipid and cholesterol levels. These may be checked every 5 years, or more frequently if you are over 37 years old. Skin check. Lung cancer screening. You may have this screening every year starting at age 28 if you have a 30-pack-year history of smoking and currently smoke or have quit within the past 15 years. Fecal occult blood test (FOBT) of the stool. You may have this test every year starting at age 91. Flexible sigmoidoscopy or colonoscopy. You may have a sigmoidoscopy every 5 years or a colonoscopy every 10 years starting at age 53. Hepatitis C blood test. Hepatitis B blood test. Sexually transmitted disease (STD) testing. Diabetes screening. This is done by checking your blood sugar (glucose) after you have not eaten for a while (fasting). You may have this done every 1-3 years. Bone density scan. This is done to screen for osteoporosis. You may have this done starting at age 1. Mammogram. This may be done every 1-2 years. Talk to your health care provider about how often you should have regular mammograms. Talk with your health care provider about your test results, treatment options, and if necessary, the need for more tests. Vaccines  Your health care provider may recommend certain vaccines, such as: Influenza vaccine. This is recommended every year. Tetanus, diphtheria, and acellular pertussis (Tdap, Td) vaccine. You may need a Td booster every 10 years. Zoster vaccine. You may need this after age 90. Pneumococcal 13-valent conjugate (PCV13) vaccine. One dose is recommended after age 25. Pneumococcal polysaccharide (PPSV23) vaccine. One dose is recommended after age 51. Talk to your health care provider about which screenings and vaccines you need and how often you need them. This information is not intended to replace advice given to you  by your health care provider. Make sure you discuss any questions you  have with your health care provider. Document Released: 02/28/2015 Document Revised: 10/22/2015 Document Reviewed: 12/03/2014 Elsevier Interactive Patient Education  2017 [employer].  Fall Prevention in the Home Falls can cause injuries. They can happen to people of all ages. There are many things you can do to make your home safe and to help prevent falls. What can I do on the outside of my home? Regularly fix the edges of walkways and driveways and fix any cracks. Remove anything that might make you trip as you walk through a door, such as a raised step or threshold. Trim any bushes or trees on the path to your home. Use bright outdoor lighting. Clear any walking paths of anything that might make someone trip, such as rocks or tools. Regularly check to see if handrails are loose or broken. Make sure that both sides of any steps have handrails. Any raised decks and porches should have guardrails on the edges. Have any leaves, snow, or ice cleared regularly. Use sand or salt on walking paths during winter. Clean up any spills in your garage right away. This includes oil or grease spills. What can I do in the bathroom? Use night lights. Install grab bars by the toilet and in the tub and shower. Do not use towel bars as grab bars. Use non-skid mats or decals in the tub or shower. If you need to sit down in the shower, use a plastic, non-slip stool. Keep the floor dry. Clean up any water that spills on the floor as soon as it happens. Remove soap buildup in the tub or shower regularly. Attach bath mats securely with double-sided non-slip rug tape. Do not have throw rugs and other things on the floor that can make you trip. What can I do in the bedroom? Use night lights. Make sure that you have a light by your bed that is easy to reach. Do not use any sheets or blankets that are too big for your bed. They should not hang down onto the floor. Have a firm chair that has side arms. You can  use this for support while you get dressed. Do not have throw rugs and other things on the floor that can make you trip. What can I do in the kitchen? Clean up any spills right away. Avoid walking on wet floors. Keep items that you use a lot in easy-to-reach places. If you need to reach something above you, use a strong step stool that has a grab bar. Keep electrical cords out of the way. Do not use floor polish or wax that makes floors slippery. If you must use wax, use non-skid floor wax. Do not have throw rugs and other things on the floor that can make you trip. What can I do with my stairs? Do not leave any items on the stairs. Make sure that there are handrails on both sides of the stairs and use them. Fix handrails that are broken or loose. Make sure that handrails are as long as the stairways. Check any carpeting to make sure that it is firmly attached to the stairs. Fix any carpet that is loose or worn. Avoid having throw rugs at the top or bottom of the stairs. If you do have throw rugs, attach them to the floor with carpet tape. Make sure that you have a light switch at the top of the stairs and the bottom of the stairs. If  you do not have them, ask someone to add them for you. What else can I do to help prevent falls? Wear shoes that: Do not have high heels. Have rubber bottoms. Are comfortable and fit you well. Are closed at the toe. Do not wear sandals. If you use a stepladder: Make sure that it is fully opened. Do not climb a closed stepladder. Make sure that both sides of the stepladder are locked into place. Ask someone to hold it for you, if possible. Clearly mark and make sure that you can see: Any grab bars or handrails. First and last steps. Where the edge of each step is. Use tools that help you move around (mobility aids) if they are needed. These include: Canes. Walkers. Scooters. Crutches. Turn on the lights when you go into a dark area. Replace any light  bulbs as soon as they burn out. Set up your furniture so you have a clear path. Avoid moving your furniture around. If any of your floors are uneven, fix them. If there are any pets around you, be aware of where they are. Review your medicines with your doctor. Some medicines can make you feel dizzy. This can increase your chance of falling. Ask your doctor what other things that you can do to help prevent falls. This information is not intended to replace advice given to you by your health care provider. Make sure you discuss any questions you have with your health care provider. Document Released: 11/28/2008 Document Revised: 07/10/2015 Document Reviewed: 03/08/2014 Elsevier Interactive Patient Education  2017 [employer].

## 2022-07-28 NOTE — Progress Notes (Signed)
Subjective:   [name] is a 67 y.o. female who presents for Medicare Annual (Subsequent) preventive examination.  I connected with  [name] on 07/28/22 by a telephone enabled telemedicine application and verified that I am speaking with the correct person using two identifiers.   I discussed the limitations of evaluation and management by telemedicine. The patient expressed understanding and agreed to proceed.  Patient location: home  Provider location: telephone/home      Review of Systems     Cardiac Risk Factors include: advanced age (>37men, >27 women);obesity (BMI >30kg/m2)     Objective:    Today's Vitals   07/28/22 0835  PainSc: 3    There is no height or weight on file to calculate BMI.     07/28/2022    8:39 AM 01/11/2022    9:31 AM 01/04/2022   11:09 AM 08/20/2021    3:36 PM 11/13/2020    5:00 PM 10/31/2020    9:09 AM 06/16/2017   10:13 AM  Advanced Directives  Does Patient Have a Medical Advance Directive? No No No No No No No  Would patient like information on creating a medical advance directive? No - Patient declined  No - Patient declined No - Patient declined Yes (MAU/Ambulatory/Procedural Areas - Information given) Yes (MAU/Ambulatory/Procedural Areas - Information given) No - Patient declined    Current Medications (verified) Outpatient Encounter Medications as of 07/28/2022  Medication Sig   calcium carbonate (TUMS - DOSED IN MG ELEMENTAL CALCIUM) 500 MG chewable tablet Chew 1 tablet by mouth daily as needed for indigestion or heartburn.   folic acid (FOLVITE) 1 MG tablet TAKE 1 TABLET(1 MG) BY MOUTH DAILY   pantoprazole (PROTONIX) 40 MG tablet TAKE 1 TABLET (40 MG TOTAL) BY MOUTH 2 (TWO) TIMES DAILY. NEEDS OFFICE VISIT FOR FURTHER REFILLS   pregabalin (LYRICA) 75 MG capsule Take 75 mg by mouth 2 (two) times daily.   SUMAtriptan (IMITREX) 100 MG tablet Take 1 tablet (100 mg total) by mouth every 2 (two) hours as needed for migraine.    carboxymethylcellulose (REFRESH PLUS) 0.5 % SOLN Place 1 drop into both eyes 3 (three) times daily as needed (dry eyes).   methocarbamol (ROBAXIN) 500 MG tablet Take 500 mg by mouth every 8 (eight) hours as needed. (Patient not taking: Reported on 03/12/2022)   ondansetron (ZOFRAN) 4 MG tablet Take 1 tablet (4 mg total) by mouth every 8 (eight) hours as needed for nausea or vomiting. (Patient not taking: Reported on 03/12/2022)   PERCOCET 10-325 MG tablet 1 tablet every 6 (six) hours as needed. (Patient not taking: Reported on 03/12/2022)   No facility-administered encounter medications on file as of 07/28/2022.    Allergies (verified) Patient has no known allergies.   History: Past Medical History:  Diagnosis Date   Arthritis    Cataract    bilateral cataracts   COVID-19    GERD (gastroesophageal reflux disease)    Microcytic anemia    Migraine    Scoliosis    Thalassemia    Thalassemia    Ulcerative pancolitis (HCC)    Colonoscopy with Dr. Rhea Belton   Vitamin D deficiency    Past Surgical History:  Procedure Laterality Date   ABDOMINAL HYSTERECTOMY     partial   ANTERIOR LAT LUMBAR FUSION N/A 01/28/2016   Procedure: ANTERIOR LATERAL LUMBAR FUSION 2 LEVELS XLIF L2-4;  Surgeon: Venita Lick, MD;  Location: [facility];  Service: Orthopedics;  Laterality: N/A;   BACK SURGERY  BREAST EXCISIONAL BIOPSY Bilateral    benign   BREAST REDUCTION SURGERY Bilateral 06/16/2017   Procedure: BILATERAL MAMMARY REDUCTION  (BREAST);  Surgeon: Peggye Form, DO;  Location: [facility];  Service: Plastics;  Laterality: Bilateral;   BREAST SURGERY Bilateral    cysts 67 yrs old   CATARACT EXTRACTION Bilateral 08/2021   COLONOSCOPY  20 + years ago   In [place]   KNEE SURGERY Left 12-13,11-10   meniscal repairs   LUMBAR FUSION  01/28/2016   ANTERIOR LATERAL LUMBAR FUSION 2 LEVELS XLIF L2-4 (N/A)   SPINAL CORD STIMULATOR INSERTION N/A 01/11/2022   Procedure: SPINAL CORD  STIMULATOR PLACEMENT;  Surgeon: Venita Lick, MD;  Location: [facility] OR;  Service: Orthopedics;  Laterality: N/A;  3 hrs 3 C-Bed   TOTAL KNEE ARTHROPLASTY Left 01/14/2015   Procedure: TOTAL KNEE ARTHROPLASTY;  Surgeon: Durene Romans, MD;  Location: [facility];  Service: Orthopedics;  Laterality: Left;   TOTAL KNEE ARTHROPLASTY Right 11/13/2020   Procedure: TOTAL KNEE ARTHROPLASTY;  Surgeon: Durene Romans, MD;  Location: [facility];  Service: Orthopedics;  Laterality: Right;   Family History  Problem Relation Age of Onset   Sickle cell trait Mother    Diabetes Brother    Diabetes Daughter    Asthma Other    Colon cancer Neg Hx    Esophageal cancer Neg Hx    Rectal cancer Neg Hx    Stomach cancer Neg Hx    Social History   Socioeconomic History   Marital status: Divorced    Spouse name: Not on file   Number of children: 3   Years of education: college   Highest education level: Not on file  Occupational History    Employer: [employer]    Comment: Computers  Tobacco Use   Smoking status: Never   Smokeless tobacco: Never  Vaping Use   Vaping Use: Never used  Substance and Sexual Activity   Alcohol use: No    Alcohol/week: 0.0 standard drinks of alcohol   Drug use: No   Sexual activity: Not Currently  Other Topics Concern   Not on file  Social History Narrative   Patient lives at home alone and she is divorced. Patient works with computers. Patient has a college education.   Right handed.   Caffeine.- one cup daily.     Social Determinants of Health   Financial Resource Strain: Low Risk  (07/28/2022)   Overall Financial Resource Strain (CARDIA)    Difficulty of Paying Living Expenses: Not hard at all  Food Insecurity: No Food Insecurity (07/28/2022)   Hunger Vital Sign    Worried About Running Out of Food in the Last Year: Never true    Ran Out of Food in the Last Year: Never true  Transportation Needs: No Transportation Needs (07/28/2022)   PRAPARE - Therapist, art (Medical): No    Lack of Transportation (Non-Medical): No  Physical Activity: Insufficiently Active (07/28/2022)   Exercise Vital Sign    Days of Exercise per Week: 3 days    Minutes of Exercise per Session: 30 min  Stress: No Stress Concern Present (07/28/2022)   [employer] of Occupational Health - Occupational Stress Questionnaire    Feeling of Stress : Not at all  Social Connections: Moderately Integrated (07/28/2022)   Social Connection and Isolation Panel [NHANES]    Frequency of Communication with Friends and Family: More than three times a week    Frequency  of Social Gatherings with Friends and Family: Twice a week    Attends Religious Services: More than 4 times per year    Active Member of [employer] or Organizations: No    Attends Engineer, structural: Never    Marital Status: Married    Tobacco Counseling Counseling given: Not Answered   Clinical Intake:  Pre-visit preparation completed: Yes  Pain : 0-10 Pain Score: 3  Pain Type: Chronic pain Pain Location: Back Pain Descriptors / Indicators: Constant, Burning, Aching, Dull Pain Onset: More than a month ago Pain Frequency: Constant     Nutritional Risks: None Diabetes: No  How often do you need to have someone help you when you read instructions, pamphlets, or other written materials from your doctor or pharmacy?: 1 - Never  Diabetic?  no  Interpreter Needed?: No  Information entered by :: Remi Haggard LPN   Activities of Daily Living    07/28/2022    8:42 AM 07/28/2022    7:28 AM  In your present state of health, do you have any difficulty performing the following activities:  Hearing? 0 0  Vision? 0 0  Difficulty concentrating or making decisions? 0 0  Walking or climbing stairs? 0 0  Dressing or bathing? 0 0  Doing errands, shopping? 0 0  Preparing Food and eating ? N N  Using the Toilet? N N  In the past six months, have you accidently leaked urine? Y Y  Do  you have problems with loss of bowel control? N N  Managing your Medications? N N  Managing your Finances? N N  Housekeeping or managing your Housekeeping? N N    Patient Care Team: Sheliah Hatch, MD as PCP - General Pyrtle, Carie Caddy, MD as Consulting Physician (Gastroenterology) Sheran Luz, MD as Consulting Physician (Physical Medicine and Rehabilitation) Erroll Luna, [facility] (Pharmacist)  Indicate any recent Medical Services you may have received from other than [facility] providers in the past year (date may be approximate).     Assessment:   This is a routine wellness examination for Earlisha.  Hearing/Vision screen Hearing Screening - Comments:: No trouble hearing  Vision Screening - Comments:: Up to date Cataract surgery bilateral Unsure of name  Dietary issues and exercise activities discussed: Current Exercise Habits: Home exercise routine, Type of exercise: stretching;strength training/weights, Time (Minutes): 30, Frequency (Times/Week): 3, Weekly Exercise (Minutes/Week): 90, Intensity: Mild   Goals Addressed             This Visit's Progress    Weight (lb) < 200 lb (90.7 kg)         Depression Screen    07/28/2022    8:45 AM 03/12/2022    1:00 PM 12/17/2021    8:23 AM 09/21/2021   10:55 AM 08/20/2021    3:37 PM 08/20/2021    3:34 PM 04/09/2021    2:00 PM  PHQ 2/9 Scores  PHQ - 2 Score 0 0 1 1 0 0 0  PHQ- 9 Score 6 0 5 7   9     Fall Risk    07/28/2022    8:39 AM 07/28/2022    7:28 AM 03/12/2022    1:00 PM 12/17/2021    8:23 AM 09/21/2021   10:55 AM  Fall Risk   Falls in the past year? 0 0 0 0 0  Number falls in past yr: 0  0 0 0  Injury with Fall? 0  0 0 0  Risk for fall due to :  Impaired balance/gait  No Fall Risks No Fall Risks No Fall Risks  Follow up Falls evaluation completed;Education provided;Falls prevention discussed  Falls evaluation completed Falls evaluation completed Falls evaluation completed    FALL RISK PREVENTION PERTAINING TO THE  HOME:  Any stairs in or around the home? No  If so, are there any without handrails? No  Home free of loose throw rugs in walkways, pet beds, electrical cords, etc? Yes  Adequate lighting in your home to reduce risk of falls? Yes   ASSISTIVE DEVICES UTILIZED TO PREVENT FALLS:  Life alert? No  Use of a cane, walker or w/c? Yes  Grab bars in the bathroom? Yes  Shower chair or bench in shower? No  Elevated toilet seat or a handicapped toilet? Yes   TIMED UP AND GO:  Was the test performed? No .    Cognitive Function:        07/28/2022    8:41 AM  6CIT Screen  What Year? 0 points  What month? 0 points  What time? 0 points  Count back from 20 0 points  Months in reverse 0 points  Repeat phrase 0 points  Total Score 0 points    Immunizations Immunization History  Administered Date(s) Administered   PFIZER(Purple Top)SARS-COV-2 Vaccination 06/02/2019, 06/26/2019   Tdap 10/24/2013    TDAP status: Up to date  Flu Vaccine status: Declined, Education has been provided regarding the importance of this vaccine but patient still declined. Advised may receive this vaccine at local pharmacy or Health Dept. Aware to provide a copy of the vaccination record if obtained from local pharmacy or Health Dept. Verbalized acceptance and understanding.    Covid-19 vaccine status: Declined, Education has been provided regarding the importance of this vaccine but patient still declined. Advised may receive this vaccine at local pharmacy or Health Dept.or vaccine clinic. Aware to provide a copy of the vaccination record if obtained from local pharmacy or Health Dept. Verbalized acceptance and understanding.  Qualifies for Shingles Vaccine? Yes   Zostavax completed No   Shingrix Completed?: No.    Education has been provided regarding the importance of this vaccine. Patient has been advised to call insurance company to determine out of pocket expense if they have not yet received this vaccine.  Advised may also receive vaccine at local pharmacy or Health Dept. Verbalized acceptance and understanding.  Screening Tests Health Maintenance  Topic Date Due   MAMMOGRAM  08/27/2021   Colonoscopy  06/21/2023   Medicare Annual Wellness (AWV)  07/28/2023   DTaP/Tdap/Td (2 - Td or Tdap) 10/25/2023   HPV VACCINES  Aged Out   INFLUENZA VACCINE  Discontinued   COVID-19 Vaccine  Discontinued   Hepatitis C Screening  Discontinued   HIV Screening  Discontinued   Zoster Vaccines- Shingrix  Discontinued    Health Maintenance  Health Maintenance Due  Topic Date Due   MAMMOGRAM  08/27/2021    Colorectal cancer screening: Type of screening: Colonoscopy. Completed 2022. Repeat every 3 years  Mammogram status: Ordered  . Pt provided with contact info and advised to call to schedule appt.     Lung Cancer Screening: (Low Dose CT Chest recommended if Age 87-80 years, 30 pack-year currently smoking OR have quit w/in 15years.) does not qualify.   Lung Cancer Screening Referral:   Additional Screening:  Hepatitis C Screening  never done  Vision Screening: Recommended annual ophthalmology exams for early detection of glaucoma and other disorders of the eye. Is the patient up  to date with their annual eye exam?  Yes  Who is the provider or what is the name of the office in which the patient attends annual eye exams? Unsure of name If pt is not established with a provider, would they like to be referred to a provider to establish care? No .   Dental Screening: Recommended annual dental exams for proper oral hygiene  Community Resource Referral / Chronic Care Management: CRR required this visit?  No   CCM required this visit?  No      Plan:     I have personally reviewed and noted the following in the patient's chart:   Medical and social history Use of alcohol, tobacco or illicit drugs  Current medications and supplements including opioid prescriptions. Patient is not currently  taking opioid prescriptions. Functional ability and status Nutritional status Physical activity Advanced directives List of other physicians Hospitalizations, surgeries, and ER visits in previous 12 months Vitals Screenings to include cognitive, depression, and falls Referrals and appointments  In addition, I have reviewed and discussed with patient certain preventive protocols, quality metrics, and best practice recommendations. A written personalized care plan for preventive services as well as general preventive health recommendations were provided to patient.     Remi Haggard, LPN   05/24/8117   Nurse Notes:

## 2022-07-28 NOTE — Telephone Encounter (Signed)
[facility] - Comprehensive Chart note. Placed in front bin.

## 2022-08-08 IMAGING — NM NM BONE 3 PHASE
2 series · 12 of 12 positions shown · non-contrast
Comparison: None.

CLINICAL DATA: Left knee pain. History of total left knee
arthroplasty.

EXAM:
NUCLEAR MEDICINE 3-PHASE BONE SCAN
TECHNIQUE: Radionuclide angiographic images, immediate static blood pool
images, and 3-hour delayed static images were obtained of the
bilateral knees after intravenous injection of radiopharmaceutical.
RADIOPHARMACEUTICALS:  21.7 mCi Vc-88m MDP IV

[Series 1: flow · 4.14mm/px · 6 of 40 frames shown (1 of 2)]
[frame 4/40]
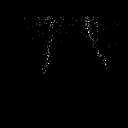
[frame 10/40  full-range]
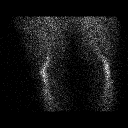
[frame 17/40  full-range]
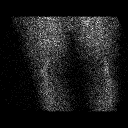
[frame 24/40  full-range]
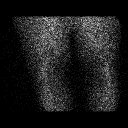
[frame 30/40  full-range]
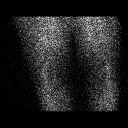
[frame 37/40  full-range]
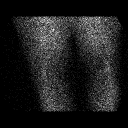

[Series 1: flow · 4.14mm/px · 6 of 40 frames shown (2 of 2)]
[frame 4/40  full-range]
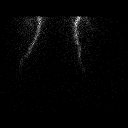
[frame 10/40  full-range]
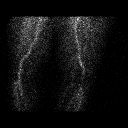
[frame 17/40  full-range]
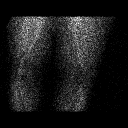
[frame 24/40  full-range]
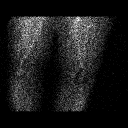
[frame 30/40  full-range]
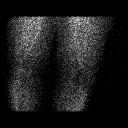
[frame 37/40  full-range]
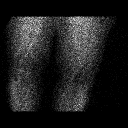

[12 of 12 positions shown; findings below may reference images not displayed]

FINDINGS: Vascular phase: Symmetric blood flow to both lower extremities.

Blood pool phase: No areas of abnormal uptake.

Delayed phase: Moderate degenerative type uptake involving the right
knee.

A fairly symmetric uptake around the left knee prosthesis. I do not
see findings suspicious for stress fracture or loosening.
IMPRESSION: 1. Symmetric blood flow to both lower extremities.
2. Normal appearing blood pool phase imaging without findings
suspicious for synovitis.
3. No findings suspicious for loosening, infection stress fracture.

## 2022-08-09 ENCOUNTER — Telehealth: Payer: Self-pay | Admitting: Internal Medicine

## 2022-08-09 MED ORDER — FOLIC ACID 1 MG PO TABS: ORAL_TABLET | ORAL | 1 refills | Status: DC

## 2022-08-09 MED ORDER — SULFASALAZINE 500 MG PO TABS: 1000.0000 mg | ORAL_TABLET | Freq: Two times a day (BID) | ORAL | 1 refills | Status: DC

## 2022-08-09 NOTE — Telephone Encounter (Signed)
PT is calling to get refills on folic acid and sulfasalazine sent to [facility] on [address]. Please advise

## 2022-08-09 NOTE — Telephone Encounter (Signed)
Prescriptions sent to patient's pharmacy.

## 2023-01-06 ENCOUNTER — Other Ambulatory Visit: Payer: Self-pay | Admitting: Internal Medicine

## 2023-01-06 DIAGNOSIS — K5100 Ulcerative (chronic) pancolitis without complications: Secondary | ICD-10-CM

## 2023-04-18 ENCOUNTER — Other Ambulatory Visit: Payer: Self-pay | Admitting: Internal Medicine

## 2023-04-18 DIAGNOSIS — K5100 Ulcerative (chronic) pancolitis without complications: Secondary | ICD-10-CM

## 2023-05-04 ENCOUNTER — Other Ambulatory Visit: Payer: Self-pay | Admitting: Internal Medicine

## 2023-05-04 DIAGNOSIS — K5100 Ulcerative (chronic) pancolitis without complications: Secondary | ICD-10-CM

## 2023-05-10 ENCOUNTER — Telehealth: Payer: Self-pay | Admitting: Internal Medicine

## 2023-05-10 DIAGNOSIS — K5100 Ulcerative (chronic) pancolitis without complications: Secondary | ICD-10-CM

## 2023-05-10 MED ORDER — PANTOPRAZOLE SODIUM 40 MG PO TBEC: DELAYED_RELEASE_TABLET | ORAL | 0 refills | Status: DC

## 2023-05-10 NOTE — Telephone Encounter (Signed)
 Patient requesting medication refill for pantoprazole [facility] on [facility].   Scheduled for next available ov with PA.   Please advise. Thank you

## 2023-05-10 NOTE — Telephone Encounter (Signed)
 Prescription sent to patient's pharmacy until scheduled appt.

## 2023-06-02 DIAGNOSIS — M5416 Radiculopathy, lumbar region: Secondary | ICD-10-CM | POA: Diagnosis not present

## 2023-06-17 ENCOUNTER — Ambulatory Visit: Admitting: Nurse Practitioner

## 2023-06-17 ENCOUNTER — Other Ambulatory Visit (INDEPENDENT_AMBULATORY_CARE_PROVIDER_SITE_OTHER)

## 2023-06-17 ENCOUNTER — Encounter: Payer: Self-pay | Admitting: Nurse Practitioner

## 2023-06-17 VITALS — BP 130/80 | HR 91 | Ht 62.0 in | Wt 219.0 lb

## 2023-06-17 DIAGNOSIS — K5100 Ulcerative (chronic) pancolitis without complications: Secondary | ICD-10-CM

## 2023-06-17 DIAGNOSIS — K219 Gastro-esophageal reflux disease without esophagitis: Secondary | ICD-10-CM

## 2023-06-17 DIAGNOSIS — R718 Other abnormality of red blood cells: Secondary | ICD-10-CM

## 2023-06-17 DIAGNOSIS — Z8719 Personal history of other diseases of the digestive system: Secondary | ICD-10-CM | POA: Diagnosis not present

## 2023-06-17 DIAGNOSIS — K51019 Ulcerative (chronic) pancolitis with unspecified complications: Secondary | ICD-10-CM

## 2023-06-17 LAB — COMPREHENSIVE METABOLIC PANEL WITH GFR
ALT: 12 U/L (ref 0–35)
AST: 18 U/L (ref 0–37)
Albumin: 4.2 g/dL (ref 3.5–5.2)
Alkaline Phosphatase: 79 U/L (ref 39–117)
BUN: 11 mg/dL (ref 6–23)
CO2: 28 meq/L (ref 19–32)
Calcium: 9.5 mg/dL (ref 8.4–10.5)
Chloride: 104 meq/L (ref 96–112)
Creatinine, Ser: 0.83 mg/dL (ref 0.40–1.20)
GFR: 73.93 mL/min (ref >60.00)
Glucose, Bld: 83 mg/dL (ref 70–99)
Potassium: 3.7 meq/L (ref 3.5–5.1)
Sodium: 141 meq/L (ref 135–145)
Total Bilirubin: 0.6 mg/dL (ref 0.2–1.2)
Total Protein: 7.8 g/dL (ref 6.0–8.3)

## 2023-06-17 LAB — C-REACTIVE PROTEIN: CRP: <1 mg/dL (ref 0.5–20.0)

## 2023-06-17 MED ORDER — SULFASALAZINE 500 MG PO TABS: 1000.0000 mg | ORAL_TABLET | Freq: Two times a day (BID) | ORAL | 3 refills | Status: DC

## 2023-06-17 MED ORDER — FOLIC ACID 1 MG PO TABS: ORAL_TABLET | ORAL | 3 refills | Status: DC

## 2023-06-17 MED ORDER — NA SULFATE-K SULFATE-MG SULF 17.5-3.13-1.6 GM/177ML PO SOLN: 1.0000 | Freq: Once | ORAL | 0 refills | Status: AC

## 2023-06-17 MED ORDER — PANTOPRAZOLE SODIUM 40 MG PO TBEC: DELAYED_RELEASE_TABLET | ORAL | 3 refills | Status: AC

## 2023-06-17 NOTE — Progress Notes (Signed)
 06/17/2023 [name] [id number] 04-Nov-1955   Chief Complaint: GERD and colitis follow up  History of Present Illness: [name] is a 68 year old female with a past medical history of migraine headaches, arthritis, vitamin D  deficiency, degenerative disc disease s/p spinal nerve stimulator 2023, GERD, esophageal dysphagia s/p dilatation 06/2020, gastritis without H. 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  She is known by Dr. Bridgett Camps.  She presents today for GERD and colitis follow-up. She remains on Pantoprazole  40 mg twice daily. She has heartburn if she skips a dose of Pantoprazole  and infrequently has nighttime reflux. No dysphagia. No upper or lower abdominal pain. She typically passes 2-3 soft to formed brown bowel movements daily but every once in a while she passes multiple nonbloody loose stools for 2 to 3 hours. No black stools. No mucus per rectum. She remains on Sulfasalazine  500 mg 2 tabs p.o. twice daily and folic acid  1 mg daily.  Her most recent EGD was 06/20/2020 which showed a normal esophagus which was empirically dilated and gastritis without evidence of H. pylori.  Her most recent colonoscopy was 06/20/2020 which showed an active ulcerative colitis and small internal hemorrhoids.  She was advised to repeat a colonoscopy in 3 years.     Latest Ref Rng & Units 12/17/2021    8:51 AM 11/14/2020    3:14 AM 10/31/2020    9:38 AM  CBC  WBC 4.0 - 10.5 K/uL 5.6  12.2  5.5   Hemoglobin 12.0 - 15.0 g/dL 40.9  9.9  81.1   Hematocrit 36.0 - 46.0 % 40.3  32.4  37.3   Platelets 150.0 - 400.0 K/uL 309.0 Repeated and verified X2.  257  301        Latest Ref Rng & Units 12/17/2021    8:51 AM 11/14/2020    3:14 AM 10/31/2020    9:38 AM  CMP  Glucose 70 - 99 mg/dL 77  914  89   BUN 6 - 23 mg/dL 11  13  11    Creatinine 0.40 - 1.20 mg/dL 7.82  9.56  2.13   Sodium 135 - 145 mEq/L 141  140  145   Potassium 3.5 - 5.1 mEq/L 4.8  4.4  4.4   Chloride 96 - 112 mEq/L 103   108  113   CO2 19 - 32 mEq/L 30  24  26    Calcium  8.4 - 10.5 mg/dL 08.6  9.2  9.8   Total Protein 6.0 - 8.3 g/dL 8.4   7.7   Total Bilirubin 0.2 - 1.2 mg/dL 0.6   0.5   Alkaline Phos 39 - 117 U/L 81   59   AST 0 - 37 U/L 22   26   ALT 0 - 35 U/L 14   13     PAST GI PROCEDURES:  EGD 06/20/2020: - Normal mucosa was found in the entire esophagus.  - No endoscopic esophageal abnormality to explain patient's dysphagia. Esophagus dilated with 52 Fr Maloney.  - Gastritis. Biopsied.  - Normal examined duodenum.  Colonoscopy 06/20/2020: - Inactive (Mayo Score 0) ulcerative colitis, improved since the last examination. Biopsied. - Small internal hemorrhoids. - 3  year recall colonoscopy  1. Surgical [P], gastric antrum and gastric body - EROSIVE GASTRITIS. - WARTHIN-STARRY STAIN IS NEGATIVE FOR HELICOBACTER PYLORI. 2. Surgical [P], right colon bx - COLONIC MUCOSA WITH NO SIGNIFICANT PATHOLOGIC FINDINGS. - NEGATIVE FOR ACTIVE INFLAMMATION AND OTHER ABNORMALITIES. 3. Surgical [P], left  colon bx - COLONIC MUCOSA WITH NO SIGNIFICANT PATHOLOGIC FINDINGS. - NEGATIVE FOR ACTIVE INFLAMMATION AND OTHER ABNORMALITIES.  Colonoscopy 10/21/2016 by Dr. Bridgett Camps: - Pancolitis ulcerative colitis. Scattered mild inflammation. Biopsied. - The examination was otherwise normal on direct and retroflexion views. - Surveillance colonoscopy 2 years   Biopsy Report: 1. Surgical [P], right colon BX - COLONIC MUCOSA WITH BENIGN LYMPHOID AGGREGATES. - NO ACTIVE INFLAMMATION, CHRONIC CHANGES OR GRANULOMAS. - NEGATIVE FOR DYSPLASIA. 2. Surgical [P], left colon BX - COLONIC MUCOSA WITH BENIGN LYMPHOID AGGREGATES. - NO ACTIVE INFLAMMATION, CHRONIC CHANGES OR GRANULOMAS. - NEGATIVE FOR DYSPLASIA.  Current Outpatient Medications on File Prior to Visit  Medication Sig Dispense Refill   calcium  carbonate (TUMS - DOSED IN MG ELEMENTAL CALCIUM ) 500 MG chewable tablet Chew 1 tablet by mouth daily as needed for indigestion  or heartburn.     traMADol  (ULTRAM ) 50 MG tablet Take 50 mg by mouth every 6 (six) hours as needed.     carboxymethylcellulose (REFRESH PLUS) 0.5 % SOLN Place 1 drop into both eyes 3 (three) times daily as needed (dry eyes).     methocarbamol  (ROBAXIN ) 500 MG tablet Take 500 mg by mouth every 8 (eight) hours as needed. (Patient not taking: Reported on 03/12/2022)     ondansetron  (ZOFRAN ) 4 MG tablet Take 1 tablet (4 mg total) by mouth every 8 (eight) hours as needed for nausea or vomiting. (Patient not taking: Reported on 03/12/2022) 20 tablet 0   PERCOCET 10-325 MG tablet 1 tablet every 6 (six) hours as needed. (Patient not taking: Reported on 03/12/2022)     pregabalin (LYRICA) 75 MG capsule Take 75 mg by mouth 2 (two) times daily. (Patient not taking: Reported on 06/17/2023)     SUMAtriptan  (IMITREX ) 100 MG tablet Take 1 tablet (100 mg total) by mouth every 2 (two) hours as needed for migraine. (Patient not taking: Reported on 06/17/2023) 10 tablet 6   No current facility-administered medications on file prior to visit.   No Known Allergies  Current Medications, Allergies, Past Medical History, Past Surgical History, Family History and Social History were reviewed in [employer] record.  Review of Systems:   Constitutional: Negative for fever, sweats, chills or weight loss.  Respiratory: Negative for shortness of breath.   Cardiovascular: Negative for chest pain, palpitations and leg swelling.  Gastrointestinal: See HPI.  Musculoskeletal: Negative for back pain or muscle aches.  Neurological: Negative for dizziness, headaches or paresthesias.   Physical Exam: BP 130/80   Pulse 91   Ht 5\' 2"  (1.575 m)   Wt 219 lb (99.3 kg)   BMI 40.06 kg/m  General: 69 year old female in no acute distress. Head: Normocephalic and atraumatic. Eyes: No scleral icterus. Conjunctiva pink . Ears: Normal auditory acuity. Mouth: Dentition intact. No ulcers or lesions.  Lungs: Clear  throughout to auscultation. Heart: Regular rate and rhythm, no murmur. Abdomen: Soft, obese abdomen.  Nontender and nondistended. No masses or hepatomegaly. Normal bowel sounds x 4 quadrants.  Rectal: Deferred. Musculoskeletal: Symmetrical with no gross deformities. Extremities: No edema. Neurological: Alert oriented x 4. No focal deficits.  Psychological: Alert and cooperative. Normal mood and affect  Assessment and Recommendations:  68 year old female with GERD,  stable. EGD 06/20/2020 showed a normal esophagus which was empirically dilated and gastritis without evidence of H. pylori.  - Continue Pantoprazole  40 mg twice daily - GERD diet  Pan ulcerative colitis, stable on Sulfasalazine  1000 mg twice daily. Her most recent colonoscopy was 06/20/2020 which showed an  active ulcerative colitis and small internal hemorrhoids.  -Continue Sulfasalazine  500 mg 2 tabs p.o. twice daily and folate 1 mg daily -CBC, CMP and CRP -Colonoscopy benefits and risks discussed including risk with sedation, risk of bleeding, perforation and infection  - Continue to avoid NSAIDs

## 2023-06-17 NOTE — Patient Instructions (Addendum)
 You have been scheduled for a colonoscopy. Please follow written instructions given to you at your visit today.   If you use inhalers (even only as needed), please bring them with you on the day of your procedure. ________________________________________________  Your provider has requested that you go to the basement level for lab work before leaving today. Press "B" on the elevator. The lab is located at the first door on the left as you exit the elevator.  Due to recent changes in healthcare laws, you may see the results of your imaging and laboratory studies on MyChart before your provider has had a chance to review them.  We understand that in some cases there may be results that are confusing or concerning to you. Not all laboratory results come back in the same time frame and the provider may be waiting for multiple results in order to interpret others.  Please give [place] 48 hours in order for your provider to thoroughly review all the results before contacting the office for clarification of your results.   Thank you for trusting me with your gastrointestinal care!   Alcide Evener, CRNP

## 2023-06-19 LAB — CBC WITH DIFFERENTIAL/PLATELET
Basophils Absolute: 0 10*3/uL (ref 0.0–0.1)
Basophils Relative: 0.5 % (ref 0.0–3.0)
Eosinophils Absolute: 0.3 10*3/uL (ref 0.0–0.7)
Eosinophils Relative: 5.2 % — ABNORMAL HIGH (ref 0.0–5.0)
HCT: 38.3 % (ref 36.0–46.0)
Hemoglobin: 12.1 g/dL (ref 12.0–15.0)
Lymphocytes Relative: 34.5 % (ref 12.0–46.0)
Lymphs Abs: 2 10*3/uL (ref 0.7–4.0)
MCHC: 31.6 g/dL (ref 30.0–36.0)
MCV: 71.6 fl — ABNORMAL LOW (ref 78.0–100.0)
Monocytes Absolute: 0.4 10*3/uL (ref 0.1–1.0)
Monocytes Relative: 7.2 % (ref 3.0–12.0)
Neutro Abs: 3 10*3/uL (ref 1.4–7.7)
Neutrophils Relative %: 52.6 % (ref 43.0–77.0)
Platelets: 304 10*3/uL (ref 150.0–400.0)
RBC: 5.34 Mil/uL — ABNORMAL HIGH (ref 3.87–5.11)
RDW: 14.9 % (ref 11.5–15.5)
WBC: 5.8 10*3/uL (ref 4.0–10.5)

## 2023-06-20 NOTE — Addendum Note (Signed)
 Addended by: Moria Brophy N on: 06/20/2023 10:08 AM   Modules accepted: Orders

## 2023-06-27 DIAGNOSIS — M545 Low back pain, unspecified: Secondary | ICD-10-CM | POA: Diagnosis not present

## 2023-06-27 DIAGNOSIS — M5416 Radiculopathy, lumbar region: Secondary | ICD-10-CM | POA: Diagnosis not present

## 2023-07-07 NOTE — Progress Notes (Signed)
 Addendum: Reviewed and agree with assessment and management plan. Asha Grumbine, Carie Caddy, MD

## 2023-07-14 DIAGNOSIS — M5416 Radiculopathy, lumbar region: Secondary | ICD-10-CM | POA: Diagnosis not present

## 2023-07-18 ENCOUNTER — Telehealth: Payer: Self-pay

## 2023-07-18 NOTE — Telephone Encounter (Signed)
-----   Message from [facility] Denyzha K sent at 06/20/2023 10:06 AM EDT ----- Regarding: Labs Return in  4 weeks

## 2023-07-18 NOTE — Telephone Encounter (Signed)
 Spoke with patient informed her to come in for repeat labs this week. Patient verbalized understanding and will come to our lab this week.

## 2023-07-21 ENCOUNTER — Other Ambulatory Visit (INDEPENDENT_AMBULATORY_CARE_PROVIDER_SITE_OTHER)

## 2023-07-21 DIAGNOSIS — R718 Other abnormality of red blood cells: Secondary | ICD-10-CM

## 2023-07-21 LAB — IBC + FERRITIN
Ferritin: 83.6 ng/mL (ref 10.0–291.0)
Iron: 79 ug/dL (ref 42–145)
Saturation Ratios: 21.4 % (ref 20.0–50.0)
TIBC: 369.6 ug/dL (ref 250.0–450.0)
Transferrin: 264 mg/dL (ref 212.0–360.0)

## 2023-07-21 LAB — CBC WITH DIFFERENTIAL/PLATELET
Basophils Absolute: 0 10*3/uL (ref 0.0–0.1)
Basophils Relative: 0.5 % (ref 0.0–3.0)
Eosinophils Absolute: 0.2 10*3/uL (ref 0.0–0.7)
Eosinophils Relative: 2.9 % (ref 0.0–5.0)
HCT: 38.3 % (ref 36.0–46.0)
Hemoglobin: 12.2 g/dL (ref 12.0–15.0)
Lymphocytes Relative: 32.7 % (ref 12.0–46.0)
Lymphs Abs: 2.6 10*3/uL (ref 0.7–4.0)
MCHC: 31.8 g/dL (ref 30.0–36.0)
MCV: 69.1 fl — ABNORMAL LOW (ref 78.0–100.0)
Monocytes Absolute: 0.4 10*3/uL (ref 0.1–1.0)
Monocytes Relative: 5.3 % (ref 3.0–12.0)
Neutro Abs: 4.6 10*3/uL (ref 1.4–7.7)
Neutrophils Relative %: 58.6 % (ref 43.0–77.0)
Platelets: 298 10*3/uL (ref 150.0–400.0)
RBC: 5.55 Mil/uL — ABNORMAL HIGH (ref 3.87–5.11)
RDW: 14.4 % (ref 11.5–15.5)
WBC: 7.9 10*3/uL (ref 4.0–10.5)

## 2023-07-21 LAB — B12 AND FOLATE PANEL
Folate: 10.6 ng/mL (ref >5.9)
Vitamin B-12: 284 pg/mL (ref 211–911)

## 2023-07-24 ENCOUNTER — Ambulatory Visit: Payer: Self-pay | Admitting: Nurse Practitioner

## 2023-07-25 ENCOUNTER — Other Ambulatory Visit: Payer: Self-pay

## 2023-07-25 ENCOUNTER — Encounter (HOSPITAL_BASED_OUTPATIENT_CLINIC_OR_DEPARTMENT_OTHER): Payer: Self-pay | Admitting: Physical Therapy

## 2023-07-25 ENCOUNTER — Ambulatory Visit (HOSPITAL_BASED_OUTPATIENT_CLINIC_OR_DEPARTMENT_OTHER): Attending: Orthopedic Surgery | Admitting: Physical Therapy

## 2023-07-25 DIAGNOSIS — R2689 Other abnormalities of gait and mobility: Secondary | ICD-10-CM | POA: Diagnosis not present

## 2023-07-25 DIAGNOSIS — M6281 Muscle weakness (generalized): Secondary | ICD-10-CM | POA: Insufficient documentation

## 2023-07-25 DIAGNOSIS — R293 Abnormal posture: Secondary | ICD-10-CM | POA: Diagnosis not present

## 2023-07-25 DIAGNOSIS — M5459 Other low back pain: Secondary | ICD-10-CM | POA: Insufficient documentation

## 2023-07-25 NOTE — Therapy (Unsigned)
 OUTPATIENT PHYSICAL THERAPY THORACOLUMBAR EVALUATION   Patient Name: [name] MRN: [id number] DOB:[birth year], 68 y.o., female Today's Date: 07/25/2023  END OF SESSION:  PT End of Session - 07/25/23 1019     Visit Number 1    Number of Visits 16    Date for PT Re-Evaluation 10/07/23    Authorization Type [facility] and [facility]    PT Start Time 1019    PT Stop Time 1058    PT Time Calculation (min) 39 min    Activity Tolerance Patient limited by pain    Behavior During Therapy WFL for tasks assessed/performed             Past Medical History:  Diagnosis Date   Arthritis    Cataract    bilateral cataracts   COVID-19    GERD (gastroesophageal reflux disease)    Microcytic anemia    Migraine    Scoliosis    Thalassemia    Thalassemia    Ulcerative pancolitis (HCC)    Colonoscopy with Dr. Bridgett Camps   Vitamin D  deficiency    Past Surgical History:  Procedure Laterality Date   ABDOMINAL HYSTERECTOMY     partial   ANTERIOR LAT LUMBAR FUSION N/A 01/28/2016   Procedure: ANTERIOR LATERAL LUMBAR FUSION 2 LEVELS XLIF L2-4;  Surgeon: Patricia Ards, MD;  Location: [facility] OR;  Service: Orthopedics;  Laterality: N/A;   BACK SURGERY     BREAST EXCISIONAL BIOPSY Bilateral    benign   BREAST REDUCTION SURGERY Bilateral 06/16/2017   Procedure: BILATERAL MAMMARY REDUCTION  (BREAST);  Surgeon: Patricia Flirt, DO;  Location: [facility];  Service: Plastics;  Laterality: Bilateral;   BREAST SURGERY Bilateral    cysts 68 yrs old   CATARACT EXTRACTION Bilateral 08/2021   COLONOSCOPY  20 + years ago   In [facility]   KNEE SURGERY Left 12-13,11-10   meniscal repairs   LUMBAR FUSION  01/28/2016   ANTERIOR LATERAL LUMBAR FUSION 2 LEVELS XLIF L2-4 (N/A)   SPINAL CORD STIMULATOR INSERTION N/A 01/11/2022   Procedure: SPINAL CORD STIMULATOR PLACEMENT;  Surgeon: Patricia Ards, MD;  Location: [facility] OR;  Service: Orthopedics;  Laterality: N/A;  3 hrs 3 C-Bed   TOTAL  KNEE ARTHROPLASTY Left 01/14/2015   Procedure: TOTAL KNEE ARTHROPLASTY;  Surgeon: Patricia Crew, MD;  Location: [facility];  Service: Orthopedics;  Laterality: Left;   TOTAL KNEE ARTHROPLASTY Right 11/13/2020   Procedure: TOTAL KNEE ARTHROPLASTY;  Surgeon: Patricia Crew, MD;  Location: [facility];  Service: Orthopedics;  Laterality: Right;   Patient Active Problem List   Diagnosis Date Noted   Chronic pain 01/11/2022   Left leg pain 04/23/2021   S/P total knee arthroplasty, right 11/13/2020   Status post bilateral breast reduction 06/23/2017   Morbid obesity (HCC) 11/08/2016   Vitamin D  deficiency 11/08/2016   Back pain 01/28/2016   S/P left TKA 01/14/2015   Pain in thoracic spine 04/05/2014   Beta thalassemia, heterozygous 03/11/2014   Right lumbar radiculopathy 11/23/2013   Ulcerative colitis, universal (HCC) 07/25/2012   Physical exam 05/19/2012   THYROIDITIS 01/27/2010   Migraine headache 01/27/2010   PALPITATIONS 12/31/2009    PCP: Patricia Priest MD  REFERRING PROVIDER: Mort Ards MD  REFERRING DIAG: M54.50 (ICD-10-CM) - Low back pain, unspecified   Rationale for Evaluation and Treatment: Rehabilitation  THERAPY DIAG:  Other low back pain  Muscle weakness (generalized)  Other abnormalities of gait and mobility  Abnormal posture  ONSET DATE: exacerbation x ~  9 months  SUBJECTIVE:                                                                                                                                                                                           SUBJECTIVE STATEMENT: Pt reports chronic LBP with radicular symptoms into lle (tingling intense at night) nerve stimulator placement over 1 year ago not helping.  Lumbar fusion L2-l4 several years ago (2017).  Has had spinal injections which have helped in past. Had SNRB left L5 and S1 06/29/23. Wakes every 2 hours due to pain. Could barely walk at all at times over the weekend  PERTINENT HISTORY:  chronic  pain syndrome  chronic back and left leg pain (S1 deratome) s/p fusion L2-3 and L3-4 by Dr. 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 Yes: NPRS scale: current 8-9/10, worst 10/10; least 5-6/10  Pain location: left side LBP with radicular pattern into foot constant Pain description: tingling left leg; burning constant Aggravating factors: walking standing with cane ~15 minutes Relieving factors: rest, ice, elevation, tramadol   PRECAUTIONS: Other: spianl cord stimulator x 1 1/2 yrs  RED FLAGS: None   WEIGHT BEARING RESTRICTIONS: No  FALLS:  Has patient fallen in last 6 months? No  LIVING ENVIRONMENT: Lives with: lives alone Lives in: House/apartment Stairs: Yes: External: 20 steps; yes Has following equipment at home: Single point cane and Walker - 2 wheeled  OCCUPATION: disabled  PLOF: Independent  PATIENT GOALS: pain relief, become more active  NEXT MD VISIT: 07/29/23 PCP  OBJECTIVE:  Note: Objective measures were completed at Evaluation unless otherwise noted.  DIAGNOSTIC FINDINGS:  None in chart  PATIENT SURVEYS:  Modified Oswestry 60% moderate disability   COGNITION: Overall cognitive status: Within functional limits for tasks assessed      POSTURE: decreased lumbar lordosis and weight shift right  PALPATION: Moderate TTP thoughout llumbar and low thoracic paraspinals, multifidi, glute  LUMBAR ROM:   AROM eval  Flexion Full P!  Extension 75% limited P!  Right lateral flexion   Left lateral flexion 50% limited  Right rotation   Left rotation    (Blank rows = not tested)  LOWER EXTREMITY ROM:    wfl  LOWER EXTREMITY MMT:    MMT Right eval Left eval  Hip flexion 3 3-P!  Hip extension    Hip abduction 5 5  Hip adduction 4-P! 4-p!  Hip internal rotation    Hip external rotation    Knee flexion 4P! 4P!  Knee extension  Ankle  dorsiflexion 5 5  Ankle plantarflexion 5 5  Ankle inversion    Ankle eversion     (Blank rows = not tested)  LUMBAR SPECIAL TESTS:  Straight leg raise test: Positive left FABER: positive left Compression: neg  FUNCTIONAL TESTS:  Timed up and go (TUG): 16.02  GAIT: Distance walked: 400 ft Assistive device utilized: Single point cane Level of assistance: Complete Independence Comments: cane adjusted to proper height. Off loading right, guarded posture.  Pt reports she alters knee position (straight/bent) with swing to decrease pain with amb.  TREATMENT  Eval Self care:Posture and body mechanic instruction                                                                                                                               PATIENT EDUCATION:  Education details: Discussed eval findings, rehab rationale, aquatic program progression/POC and pools in area. Patient is in agreement  Person educated: Patient Education method: Explanation Education comprehension: verbalized understanding  HOME EXERCISE PROGRAM: TBA  ASSESSMENT:  CLINICAL IMPRESSION: Patient is a 68 y.o. m who was seen today for physical therapy evaluation and treatment for lbp. She has had many years of chronic pain with surgical interveentions, injections as well as a nerve stimulator placed.  She presents today with reports of high uncontrolled pain.  Stimulator has been adjusted without releife.  Recent injections without any relief as well.  She is pain limited with exam in all objective and functional testing.  Cane adjusted to proper height.exaPt has good attitude.  OBJECTIVE IMPAIRMENTS: Abnormal gait, decreased activity tolerance, decreased balance, decreased endurance, decreased mobility, difficulty walking, decreased ROM, decreased strength, postural dysfunction, obesity, and pain.   ACTIVITY LIMITATIONS: carrying, lifting, bending, sitting, standing, squatting, sleeping, stairs, transfers, locomotion  level, and caring for others  PARTICIPATION LIMITATIONS: meal prep, cleaning, laundry, shopping, community activity, occupation, and yard work  PERSONAL FACTORS: Fitness, Past/current experiences, Time since onset of injury/illness/exacerbation, and 1-2 comorbidities: see pmhx are also affecting patient's functional outcome.   REHAB POTENTIAL: Good  CLINICAL DECISION MAKING: Evolving/moderate complexity  EVALUATION COMPLEXITY: Moderate   GOALS: Goals reviewed with patient? Yes  SHORT TERM GOALS: Target date: ***  Pt will tolerate full aquatic sessions consistently without increase in pain and with improving function to demonstrate good toleration and effectiveness of intervention.  Baseline: Goal status: INITIAL  2.  *** Baseline:  Goal status: INITIAL  3.  *** Baseline:  Goal status: INITIAL  4.  *** Baseline:  Goal status: INITIAL  5.  *** Baseline:  Goal status: INITIAL  6.  *** Baseline:  Goal status: INITIAL  LONG TERM GOALS: Target date: ***  Pt to improve on ODI by 15% to demonstrate statistically significant Improvement in function. Baseline: 60% Goal status: INITIAL  2.  *** Baseline:  Goal status: INITIAL  3.  *** Baseline:  Goal status: INITIAL  4.  *** Baseline:  Goal status: INITIAL  5.  ***  Baseline:  Goal status: INITIAL  6.  *** Baseline:  Goal status: INITIAL  PLAN:  PT FREQUENCY: 2x/week  PT DURATION: 10 weeks 16 visits.  Extended duration due to scheduling conflicts  PLANNED INTERVENTIONS: 97164- PT Re-evaluation, 97110-Therapeutic exercises, 97530- Therapeutic activity, 97112- Neuromuscular re-education, [phone]- Self Care, 60454- Manual therapy, [phone]- Gait training, [phone]- Orthotic Initial, [phone]- Aquatic Therapy, [phone]- Ionotophoresis 4mg /ml Dexamethasone , Patient/Family education, Balance training, Stair training, Taping, Joint mobilization, Joint manipulation, DME instructions, Cryotherapy, and Moist heat.  PLAN FOR  NEXT SESSION: Adriana [place]) Asuncion Shibata MPT 07/25/23 1:39 PM [facility] [address] [place], [place], [address] Phone: [phone]   Fax:  [phone]  Date of referral: 06/09/23 Referring provider: Mort Ards MD Referring diagnosis? M54.50 (ICD-10-CM) - Low back pain, unspecified  Treatment diagnosis? (if different than referring diagnosis) no  What was this (referring dx) caused by? Ongoing Issue  Lonne Roan of Condition: Chronic (continuous duration > 3 months)   Laterality: Both  Current Functional Measure Score: Other ODI 60%  Objective measurements identify impairments when they are compared to normal values, the uninvolved extremity, and prior level of function.  [x]  Yes  []  No  Objective assessment of functional ability: Moderate functional limitations   Briefly describe symptoms: constant pain tingling through LE, muscle weakness, inability to sleep  How did symptoms start: pain after having children 40 yrs ago  Average pain intensity:  Last 24 hours: 8/10  Past week: 8/10  How often does the pt experience symptoms? Constantly  How much have the symptoms interfered with usual daily activities? Extremely  How has condition changed since care began at this facility? NA - initial visit  In general, how is the patients overall health? Good   BACK PAIN (STarT Back Screening Tool) Has pain spread down the leg(s) at some time in the last 2 weeks? yes Has there been pain in the shoulder or neck at some time in the last 2 weeks? no Has the pt only walked short distances because of back pain? yes Has patient dressed more slowly because of back pain in the past 2 weeks? yes Does patient think it's not safe for a person with this condition to be physically active? no Does patient have worrying thoughts a lot of the time? no Does patient feel back pain is terrible and will never get any better? no Has patient  stopped enjoying things they usually enjoy? yes

## 2023-07-29 ENCOUNTER — Encounter: Admitting: Family Medicine

## 2023-08-01 DIAGNOSIS — M79605 Pain in left leg: Secondary | ICD-10-CM | POA: Diagnosis not present

## 2023-08-03 ENCOUNTER — Ambulatory Visit: Admitting: Family Medicine

## 2023-08-03 DIAGNOSIS — E559 Vitamin D deficiency, unspecified: Secondary | ICD-10-CM

## 2023-08-03 DIAGNOSIS — Z0000 Encounter for general adult medical examination without abnormal findings: Secondary | ICD-10-CM | POA: Diagnosis not present

## 2023-08-03 DIAGNOSIS — Z1159 Encounter for screening for other viral diseases: Secondary | ICD-10-CM | POA: Diagnosis not present

## 2023-08-03 DIAGNOSIS — Z114 Encounter for screening for human immunodeficiency virus [HIV]: Secondary | ICD-10-CM

## 2023-08-03 DIAGNOSIS — E538 Deficiency of other specified B group vitamins: Secondary | ICD-10-CM

## 2023-08-03 DIAGNOSIS — E669 Obesity, unspecified: Secondary | ICD-10-CM | POA: Diagnosis not present

## 2023-08-03 MED ORDER — SUMATRIPTAN SUCCINATE 100 MG PO TABS
100.0000 mg | ORAL_TABLET | ORAL | 6 refills | Status: AC | PRN
Start: 2023-08-03 — End: ?

## 2023-08-03 NOTE — Progress Notes (Signed)
   Subjective:    Patient ID: [name], female    DOB: 1955-06-14, 68 y.o.   MRN: [id number]  HPI CPE- due for mammo, DEXA, colonoscopy, PNA.  Will discuss at a later date once back pain is under better control  Patient Care Team    Relationship Specialty Notifications Start End  Jess Morita, MD PCP - General   01/21/10   Pyrtle, Amber Bail, MD Consulting Physician Gastroenterology  05/09/15   Adelaide Adjutant, MD Consulting Physician Physical Medicine and Rehabilitation  05/09/15   Myrle Aspen, [facility] (Inactive)  Pharmacist  07/01/21    Comment: [phone]    Health Maintenance  Topic Date Due   HIV Screening  Never done   Hepatitis C Screening  Never done   Pneumococcal Vaccine: 50+ Years (1 of 1 - PCV) Never done   Zoster Vaccines- Shingrix (1 of 2) Never done   MAMMOGRAM  08/27/2021   COVID-19 Vaccine (3 - 2024-25 season) 10/17/2022   DEXA SCAN  12/10/2022   Colonoscopy  06/21/2023   Medicare Annual Wellness (AWV)  07/28/2023   INFLUENZA VACCINE  09/16/2023   DTaP/Tdap/Td (2 - Td or Tdap) 10/25/2023   HPV VACCINES  Aged Out   Meningococcal B Vaccine  Aged Out      Review of Systems Patient reports no vision/ hearing changes, adenopathy,fever, weight change,  persistant/recurrent hoarseness , swallowing issues, chest pain, palpitations, edema, persistant/recurrent cough, hemoptysis, dyspnea (rest/exertional/paroxysmal nocturnal), gastrointestinal bleeding (melena, rectal bleeding), abdominal pain, significant heartburn, bowel changes, GU symptoms (dysuria, hematuria, incontinence), Gyn symptoms (abnormal  bleeding, pain),  syncope, focal weakness, memory loss, skin/hair/nail changes, abnormal bruising or bleeding, anxiety, or depression.   + numbness due to ongoing back issues    Objective:   Physical Exam General Appearance:    Alert, cooperative, appears stated age, obviously in pain, obese  Head:    Normocephalic, without obvious abnormality, atraumatic  Eyes:     PERRL, conjunctiva/corneas clear, EOM's intact both eyes  Ears:    Normal TM's and external ear canals, both ears  Nose:   Nares normal, septum midline, mucosa normal, no drainage    or sinus tenderness  Throat:   Lips, mucosa, and tongue normal; teeth and gums normal  Neck:   Supple, symmetrical, trachea midline, no adenopathy;    Thyroid : no enlargement/tenderness/nodules  Back:     Symmetric, no curvature, ROM normal, no CVA tenderness  Lungs:     Clear to auscultation bilaterally, respirations unlabored  Chest Wall:    No tenderness or deformity   Heart:    Regular rate and rhythm, S1 and S2 normal, no murmur, rub   or gallop  Breast Exam:    Deferred to GYN  Abdomen:     Soft, non-tender, bowel sounds active all four quadrants,    no masses, no organomegaly  Genitalia:    Deferred to GYN  Rectal:    Extremities:   Extremities normal, atraumatic, no cyanosis or edema  Pulses:   2+ and symmetric all extremities  Skin:   Skin color, texture, turgor normal, no rashes or lesions  Lymph nodes:   Cervical, supraclavicular, and axillary nodes normal  Neurologic:   CNII-XII intact          Assessment & Plan:

## 2023-08-03 NOTE — Assessment & Plan Note (Signed)
 Pt's PE WNL w/ exception of BMI and fact that pt is clearly in pain.  Due for mammo and colonoscopy- will address when pt is feeling better.  Check labs.  Anticipatory guidance provided.

## 2023-08-03 NOTE — Patient Instructions (Signed)
 Follow up in 1 year or as needed We'll notify you of your lab results and make any changes if needed Once your pain is under better control, we can talk about mammograms and colonoscopy Call with any questions or concerns Stay Safe!  Stay Healthy! Hang in there!!!

## 2023-08-16 ENCOUNTER — Ambulatory Visit (HOSPITAL_BASED_OUTPATIENT_CLINIC_OR_DEPARTMENT_OTHER): Admitting: Physical Therapy

## 2023-08-23 ENCOUNTER — Telehealth: Payer: Self-pay | Admitting: Nurse Practitioner

## 2023-08-23 ENCOUNTER — Ambulatory Visit (INDEPENDENT_AMBULATORY_CARE_PROVIDER_SITE_OTHER): Admitting: *Deleted

## 2023-08-23 VITALS — Ht 62.0 in | Wt 219.0 lb

## 2023-08-23 DIAGNOSIS — Z13820 Encounter for screening for osteoporosis: Secondary | ICD-10-CM

## 2023-08-23 DIAGNOSIS — Z1231 Encounter for screening mammogram for malignant neoplasm of breast: Secondary | ICD-10-CM

## 2023-08-23 DIAGNOSIS — Z0000 Encounter for general adult medical examination without abnormal findings: Secondary | ICD-10-CM | POA: Diagnosis not present

## 2023-08-23 MED ORDER — NA SULFATE-K SULFATE-MG SULF 17.5-3.13-1.6 GM/177ML PO SOLN: 1.0000 | Freq: Once | ORAL | 0 refills | Status: AC

## 2023-08-23 NOTE — Telephone Encounter (Signed)
 Patient is scheduled for a colonoscopy on July the 21 st. Patient stated that she is needing her prep medication sent over to her pharmacy. Please advise.

## 2023-08-23 NOTE — Patient Instructions (Signed)
 [name] , Thank you for taking time to come for your Medicare Wellness Visit. I appreciate your ongoing commitment to your health goals. Please review the following plan we discussed and let me know if I can assist you in the future.   Screening recommendations/referrals: Colonoscopy: scheduled Mammogram: Education provided Bone Density: Education provided Recommended yearly ophthalmology/optometry visit for glaucoma screening and checkup Recommended yearly dental visit for hygiene and checkup  Vaccinations: Influenza vaccine: Education provided Pneumococcal vaccine: Education provided Tdap vaccine: up to date Shingles vaccine: Education provided       Preventive Care 65 Years and Older, Female Preventive care refers to lifestyle choices and visits with your health care provider that can promote health and wellness. What does preventive care include? A yearly physical exam. This is also called an annual well check. Dental exams once or twice a year. Routine eye exams. Ask your health care provider how often you should have your eyes checked. Personal lifestyle choices, including: Daily care of your teeth and gums. Regular physical activity. Eating a healthy diet. Avoiding tobacco and drug use. Limiting alcohol use. Practicing safe sex. Taking low-dose aspirin  every day. Taking vitamin and mineral supplements as recommended by your health care provider. What happens during an annual well check? The services and screenings done by your health care provider during your annual well check will depend on your age, overall health, lifestyle risk factors, and family history of disease. Counseling  Your health care provider may ask you questions about your: Alcohol use. Tobacco use. Drug use. Emotional well-being. Home and relationship well-being. Sexual activity. Eating habits. History of falls. Memory and ability to understand (cognition). Work and work  Astronomer. Reproductive health. Screening  You may have the following tests or measurements: Height, weight, and BMI. Blood pressure. Lipid and cholesterol levels. These may be checked every 5 years, or more frequently if you are over 71 years old. Skin check. Lung cancer screening. You may have this screening every year starting at age 22 if you have a 30-pack-year history of smoking and currently smoke or have quit within the past 15 years. Fecal occult blood test (FOBT) of the stool. You may have this test every year starting at age 49. Flexible sigmoidoscopy or colonoscopy. You may have a sigmoidoscopy every 5 years or a colonoscopy every 10 years starting at age 52. Hepatitis C blood test. Hepatitis B blood test. Sexually transmitted disease (STD) testing. Diabetes screening. This is done by checking your blood sugar (glucose) after you have not eaten for a while (fasting). You may have this done every 1-3 years. Bone density scan. This is done to screen for osteoporosis. You may have this done starting at age 48. Mammogram. This may be done every 1-2 years. Talk to your health care provider about how often you should have regular mammograms. Talk with your health care provider about your test results, treatment options, and if necessary, the need for more tests. Vaccines  Your health care provider may recommend certain vaccines, such as: Influenza vaccine. This is recommended every year. Tetanus, diphtheria, and acellular pertussis (Tdap, Td) vaccine. You may need a Td booster every 10 years. Zoster vaccine. You may need this after age 71. Pneumococcal 13-valent conjugate (PCV13) vaccine. One dose is recommended after age 38. Pneumococcal polysaccharide (PPSV23) vaccine. One dose is recommended after age 48. Talk to your health care provider about which screenings and vaccines you need and how often you need them. This information is not intended to replace  advice given to you by  your health care provider. Make sure you discuss any questions you have with your health care provider. Document Released: 02/28/2015 Document Revised: 10/22/2015 Document Reviewed: 12/03/2014 Elsevier Interactive Patient Education  2017 [employer].  Fall Prevention in the Home Falls can cause injuries. They can happen to people of all ages. There are many things you can do to make your home safe and to help prevent falls. What can I do on the outside of my home? Regularly fix the edges of walkways and driveways and fix any cracks. Remove anything that might make you trip as you walk through a door, such as a raised step or threshold. Trim any bushes or trees on the path to your home. Use bright outdoor lighting. Clear any walking paths of anything that might make someone trip, such as rocks or tools. Regularly check to see if handrails are loose or broken. Make sure that both sides of any steps have handrails. Any raised decks and porches should have guardrails on the edges. Have any leaves, snow, or ice cleared regularly. Use sand or salt on walking paths during winter. Clean up any spills in your garage right away. This includes oil or grease spills. What can I do in the bathroom? Use night lights. Install grab bars by the toilet and in the tub and shower. Do not use towel bars as grab bars. Use non-skid mats or decals in the tub or shower. If you need to sit down in the shower, use a plastic, non-slip stool. Keep the floor dry. Clean up any water  that spills on the floor as soon as it happens. Remove soap buildup in the tub or shower regularly. Attach bath mats securely with double-sided non-slip rug tape. Do not have throw rugs and other things on the floor that can make you trip. What can I do in the bedroom? Use night lights. Make sure that you have a light by your bed that is easy to reach. Do not use any sheets or blankets that are too big for your bed. They should not hang  down onto the floor. Have a firm chair that has side arms. You can use this for support while you get dressed. Do not have throw rugs and other things on the floor that can make you trip. What can I do in the kitchen? Clean up any spills right away. Avoid walking on wet floors. Keep items that you use a lot in easy-to-reach places. If you need to reach something above you, use a strong step stool that has a grab bar. Keep electrical cords out of the way. Do not use floor polish or wax that makes floors slippery. If you must use wax, use non-skid floor wax. Do not have throw rugs and other things on the floor that can make you trip. What can I do with my stairs? Do not leave any items on the stairs. Make sure that there are handrails on both sides of the stairs and use them. Fix handrails that are broken or loose. Make sure that handrails are as long as the stairways. Check any carpeting to make sure that it is firmly attached to the stairs. Fix any carpet that is loose or worn. Avoid having throw rugs at the top or bottom of the stairs. If you do have throw rugs, attach them to the floor with carpet tape. Make sure that you have a light switch at the top of the stairs and the bottom  of the stairs. If you do not have them, ask someone to add them for you. What else can I do to help prevent falls? Wear shoes that: Do not have high heels. Have rubber bottoms. Are comfortable and fit you well. Are closed at the toe. Do not wear sandals. If you use a stepladder: Make sure that it is fully opened. Do not climb a closed stepladder. Make sure that both sides of the stepladder are locked into place. Ask someone to hold it for you, if possible. Clearly mark and make sure that you can see: Any grab bars or handrails. First and last steps. Where the edge of each step is. Use tools that help you move around (mobility aids) if they are needed. These  include: Canes. Walkers. Scooters. Crutches. Turn on the lights when you go into a dark area. Replace any light bulbs as soon as they burn out. Set up your furniture so you have a clear path. Avoid moving your furniture around. If any of your floors are uneven, fix them. If there are any pets around you, be aware of where they are. Review your medicines with your doctor. Some medicines can make you feel dizzy. This can increase your chance of falling. Ask your doctor what other things that you can do to help prevent falls. This information is not intended to replace advice given to you by your health care provider. Make sure you discuss any questions you have with your health care provider. Document Released: 11/28/2008 Document Revised: 07/10/2015 Document Reviewed: 03/08/2014 Elsevier Interactive Patient Education  2017 [employer].

## 2023-08-23 NOTE — Progress Notes (Signed)
 Subjective:   [name] is a 68 y.o. female who presents for Medicare Annual (Subsequent) preventive examination.  Visit Complete: Virtual I connected with  [name] on 08/23/23 by a audio enabled telemedicine application and verified that I am speaking with the correct person using two identifiers.  Patient Location: Home  Provider Location: Home Office  I discussed the limitations of evaluation and management by telemedicine. The patient expressed understanding and agreed to proceed.  Vital Signs: Because this visit was a virtual/telehealth visit, some criteria may be missing or patient reported. Any vitals not documented were not able to be obtained and vitals that have been documented are patient reported.  Patient Medicare AWV questionnaire was completed by the patient on 08-22-2023; I have confirmed that all information answered by patient is correct and no changes since this date.  Cardiac Risk Factors include: advanced age (>20men, >47 women);obesity (BMI >30kg/m2)     Objective:    Today's Vitals   08/23/23 0855  Weight: 219 lb (99.3 kg)  Height: 5' 2 (1.575 m)  PainSc: 4    Body mass index is 40.06 kg/m.     08/23/2023    8:56 AM 07/25/2023    1:34 PM 07/28/2022    8:39 AM 01/11/2022    9:31 AM 01/04/2022   11:09 AM 08/20/2021    3:36 PM 11/13/2020    5:00 PM  Advanced Directives  Does Patient Have a Medical Advance Directive? No No No No No No No  Would patient like information on creating a medical advance directive? No - Patient declined No - Patient declined No - Patient declined  No - Patient declined No - Patient declined Yes (MAU/Ambulatory/Procedural Areas - Information given)    Current Medications (verified) Outpatient Encounter Medications as of 08/23/2023  Medication Sig   calcium  carbonate (TUMS - DOSED IN MG ELEMENTAL CALCIUM ) 500 MG chewable tablet Chew 1 tablet by mouth daily as needed for indigestion or heartburn.   folic acid  (FOLVITE ) 1  MG tablet TAKE 1 TABLET(1 MG) BY MOUTH DAILY   pantoprazole  (PROTONIX ) 40 MG tablet TAKE 1 TABLET (40 MG TOTAL) BY MOUTH 2 (TWO) TIMES DAILY.   sulfaSALAzine  (AZULFIDINE ) 500 MG tablet Take 2 tablets (1,000 mg total) by mouth 2 (two) times daily.   SUMAtriptan  (IMITREX ) 100 MG tablet Take 1 tablet (100 mg total) by mouth every 2 (two) hours as needed for migraine.   traMADol  (ULTRAM ) 50 MG tablet Take 50 mg by mouth every 6 (six) hours as needed.   No facility-administered encounter medications on file as of 08/23/2023.    Allergies (verified) Patient has no known allergies.   History: Past Medical History:  Diagnosis Date   Arthritis    Cataract    bilateral cataracts   COVID-19    GERD (gastroesophageal reflux disease)    Microcytic anemia    Migraine    Scoliosis    Thalassemia    Thalassemia    Ulcerative pancolitis (HCC)    Colonoscopy with Dr. Albertus   Vitamin D  deficiency    Past Surgical History:  Procedure Laterality Date   ABDOMINAL HYSTERECTOMY     partial   ANTERIOR LAT LUMBAR FUSION N/A 01/28/2016   Procedure: ANTERIOR LATERAL LUMBAR FUSION 2 LEVELS XLIF L2-4;  Surgeon: Donaciano Sprang, MD;  Location: [facility] OR;  Service: Orthopedics;  Laterality: N/A;   BACK SURGERY     BREAST EXCISIONAL BIOPSY Bilateral    benign   BREAST REDUCTION SURGERY Bilateral 06/16/2017  Procedure: BILATERAL MAMMARY REDUCTION  (BREAST);  Surgeon: Lowery Estefana RAMAN, DO;  Location: [facility];  Service: Plastics;  Laterality: Bilateral;   BREAST SURGERY Bilateral    cysts 68 yrs old   CATARACT EXTRACTION Bilateral 08/2021   COLONOSCOPY  20 + years ago   In [place] [facility]   KNEE SURGERY Left 12-13,11-10   meniscal repairs   LUMBAR FUSION  01/28/2016   ANTERIOR LATERAL LUMBAR FUSION 2 LEVELS XLIF L2-4 (N/A)   SPINAL CORD STIMULATOR INSERTION N/A 01/11/2022   Procedure: SPINAL CORD STIMULATOR PLACEMENT;  Surgeon: Burnetta Aures, MD;  Location: [facility] OR;  Service:  Orthopedics;  Laterality: N/A;  3 hrs 3 C-Bed   TOTAL KNEE ARTHROPLASTY Left 01/14/2015   Procedure: TOTAL KNEE ARTHROPLASTY;  Surgeon: Donnice Car, MD;  Location: [facility];  Service: Orthopedics;  Laterality: Left;   TOTAL KNEE ARTHROPLASTY Right 11/13/2020   Procedure: TOTAL KNEE ARTHROPLASTY;  Surgeon: Car Donnice, MD;  Location: [facility];  Service: Orthopedics;  Laterality: Right;   Family History  Problem Relation Age of Onset   Sickle cell trait Mother    Diabetes Brother    Diabetes Daughter    Asthma Other    Colon cancer Neg Hx    Esophageal cancer Neg Hx    Rectal cancer Neg Hx    Stomach cancer Neg Hx    Social History   Socioeconomic History   Marital status: Divorced    Spouse name: Not on file   Number of children: 3   Years of education: college   Highest education level: Bachelor's degree (e.g., BA, AB, BS)  Occupational History    Employer: [employer]    Comment: Computers  Tobacco Use   Smoking status: Never   Smokeless tobacco: Never  Vaping Use   Vaping status: Never Used  Substance and Sexual Activity   Alcohol use: No    Alcohol/week: 0.0 standard drinks of alcohol   Drug use: No   Sexual activity: Not Currently  Other Topics Concern   Not on file  Social History Narrative   Patient lives at home alone and she is divorced. Patient works with computers. Patient has a college education.   Right handed.   Caffeine.- one cup daily.     Social Drivers of Corporate investment banker Strain: Low Risk  (08/23/2023)   Overall Financial Resource Strain (CARDIA)    Difficulty of Paying Living Expenses: Not very hard  Food Insecurity: Food Insecurity Present (08/23/2023)   Hunger Vital Sign    Worried About Running Out of Food in the Last Year: Sometimes true    Ran Out of Food in the Last Year: Never true  Transportation Needs: Unmet Transportation Needs (08/23/2023)   PRAPARE - Administrator, Civil Service (Medical): Yes    Lack  of Transportation (Non-Medical): Yes  Physical Activity: Inactive (08/23/2023)   Exercise Vital Sign    Days of Exercise per Week: 0 days    Minutes of Exercise per Session: 0 min  Stress: No Stress Concern Present (08/23/2023)   [employer] of Occupational Health - Occupational Stress Questionnaire    Feeling of Stress: Only a little  Social Connections: Moderately Isolated (08/23/2023)   Social Connection and Isolation Panel    Frequency of Communication with Friends and Family: More than three times a week    Frequency of Social Gatherings with Friends and Family: More than three times a week    Attends Religious  Services: More than 4 times per year    Active Member of Clubs or Organizations: No    Attends Engineer, structural: Not on file    Marital Status: Divorced    Tobacco Counseling Counseling given: Not Answered   Clinical Intake:  Pre-visit preparation completed: Yes  Pain : 0-10 Pain Score: 4  Pain Location: Back Pain Descriptors / Indicators: Burning, Constant, Aching, Dull Pain Onset: More than a month ago Pain Frequency: Constant Pain Relieving Factors: tramadol   Pain Relieving Factors: tramadol   Diabetes: No  How often do you need to have someone help you when you read instructions, pamphlets, or other written materials from your doctor or pharmacy?: 1 - Never  Interpreter Needed?: No  Information entered by :: Mliss Graff LPN   Activities of Daily Living    08/23/2023    8:58 AM 08/22/2023    6:55 PM  In your present state of health, do you have any difficulty performing the following activities:  Hearing? 0 0  Vision? 0 0  Difficulty concentrating or making decisions? 0 0  Walking or climbing stairs? 0 0  Dressing or bathing? 0 0  Doing errands, shopping? 0 0  Preparing Food and eating ? N N  Using the Toilet? N N  In the past six months, have you accidently leaked urine? N N  Do you have problems with loss of bowel control? N N   Managing your Medications? N N  Managing your Finances? N N  Housekeeping or managing your Housekeeping? N N    Patient Care Team: Mahlon Comer BRAVO, MD as PCP - General Pyrtle, Gordy HERO, MD as Consulting Physician (Gastroenterology) Bonner Ade, MD as Consulting Physician (Physical Medicine and Rehabilitation) Nicholaus Sherlean CROME, [facility] (Inactive) (Pharmacist)  Indicate any recent Medical Services you may have received from other than [facility] providers in the past year (date may be approximate).     Assessment:   This is a routine wellness examination for Ethelreda.  Hearing/Vision screen Hearing Screening - Comments:: No trouble hearing Vision Screening - Comments:: Had cataract surgery Not up to date [employer]   Goals Addressed             This Visit's Progress    Weight (lb) < 200 lb (90.7 kg)   219 lb (99.3 kg)    25 pounds       Depression Screen    08/23/2023    8:59 AM 08/03/2023    9:27 AM 07/28/2022    8:45 AM 03/12/2022    1:00 PM 12/17/2021    8:23 AM 09/21/2021   10:55 AM 08/20/2021    3:37 PM  PHQ 2/9 Scores  PHQ - 2 Score 2 2 0 0 1 1 0  PHQ- 9 Score 5 9 6  0 5 7     Fall Risk    08/23/2023    8:54 AM 08/22/2023    6:55 PM 08/03/2023    9:27 AM 07/28/2022    8:39 AM 07/28/2022    7:28 AM  Fall Risk   Falls in the past year? 0 0 0 0 0  Number falls in past yr: 0 0 0 0   Injury with Fall? 0 0 0 0   Risk for fall due to :   No Fall Risks Impaired balance/gait   Follow up Falls evaluation completed;Education provided;Falls prevention discussed   Falls evaluation completed;Education provided;Falls prevention discussed     MEDICARE RISK AT HOME: Medicare Risk at Home  Any stairs in or around the home?: Yes If so, are there any without handrails?: Yes Home free of loose throw rugs in walkways, pet beds, electrical cords, etc?: Yes Adequate lighting in your home to reduce risk of falls?: Yes Life alert?: No Use of a cane, walker or w/c?: Yes Grab bars in the  bathroom?: No Shower chair or bench in shower?: No Elevated toilet seat or a handicapped toilet?: Yes  TIMED UP AND GO:  Was the test performed?  No    Cognitive Function:        08/23/2023    8:58 AM 07/28/2022    8:41 AM  6CIT Screen  What Year? 0 points 0 points  What month? 0 points 0 points  What time?  0 points  Count back from 20 0 points 0 points  Months in reverse 0 points 0 points  Repeat phrase 0 points 0 points  Total Score  0 points    Immunizations Immunization History  Administered Date(s) Administered   PFIZER(Purple Top)SARS-COV-2 Vaccination 06/02/2019, 06/26/2019   Tdap 10/24/2013    TDAP status: Up to date  Flu Vaccine status: Up to date  Pneumococcal vaccine status: Due, Education has been provided regarding the importance of this vaccine. Advised may receive this vaccine at local pharmacy or Health Dept. Aware to provide a copy of the vaccination record if obtained from local pharmacy or Health Dept. Verbalized acceptance and understanding.  Covid-19 vaccine status: Information provided on how to obtain vaccines.   Qualifies for Shingles Vaccine? Yes   Zostavax completed No   Shingrix Completed?: No.    Education has been provided regarding the importance of this vaccine. Patient has been advised to call insurance company to determine out of pocket expense if they have not yet received this vaccine. Advised may also receive vaccine at local pharmacy or Health Dept. Verbalized acceptance and understanding.  Screening Tests Health Maintenance  Topic Date Due   HIV Screening  Never done   Hepatitis C Screening  Never done   Pneumococcal Vaccine: 50+ Years (1 of 1 - PCV) Never done   Zoster Vaccines- Shingrix (1 of 2) Never done   MAMMOGRAM  08/27/2021   DEXA SCAN  12/10/2022   Colonoscopy  06/21/2023   INFLUENZA VACCINE  09/16/2023   DTaP/Tdap/Td (2 - Td or Tdap) 10/25/2023   Medicare Annual Wellness (AWV)  08/22/2024   Hepatitis B Vaccines   Aged Out   HPV VACCINES  Aged Out   Meningococcal B Vaccine  Aged Out   COVID-19 Vaccine  Discontinued    Health Maintenance  Health Maintenance Due  Topic Date Due   HIV Screening  Never done   Hepatitis C Screening  Never done   Pneumococcal Vaccine: 50+ Years (1 of 1 - PCV) Never done   Zoster Vaccines- Shingrix (1 of 2) Never done   MAMMOGRAM  08/27/2021   DEXA SCAN  12/10/2022   Colonoscopy  06/21/2023    Colonoscopy scheduled   Mammogram status: Ordered  . Pt provided with contact info and advised to call to schedule appt.   Bone Density status: Ordered  . Pt provided with contact info and advised to call to schedule appt.  Lung Cancer Screening: (Low Dose CT Chest recommended if Age 19-80 years, 20 pack-year currently smoking OR have quit w/in 15years.) does not qualify.   Lung Cancer Screening Referral:   Additional Screening:  Hepatitis C Screening   never done  Vision Screening: Recommended annual ophthalmology  exams for early detection of glaucoma and other disorders of the eye. Is the patient up to date with their annual eye exam?  No  Who is the provider or what is the name of the office in which the patient attends annual eye exams? [facility] If pt is not established with a provider, would they like to be referred to a provider to establish care? No .   Dental Screening: Recommended annual dental exams for proper oral hygiene    Community Resource Referral / Chronic Care Management: CRR required this visit?  No   CCM required this visit?  No     Plan:     I have personally reviewed and noted the following in the patient's chart:   Medical and social history Use of alcohol, tobacco or illicit drugs  Current medications and supplements including opioid prescriptions. Patient is not currently taking opioid prescriptions. Functional ability and status Nutritional status Physical activity Advanced directives List of other  physicians Hospitalizations, surgeries, and ER visits in previous 12 months Vitals Screenings to include cognitive, depression, and falls Referrals and appointments  In addition, I have reviewed and discussed with patient certain preventive protocols, quality metrics, and best practice recommendations. A written personalized care plan for preventive services as well as general preventive health recommendations were provided to patient.     Mliss Graff, LPN   03/18/7972   After Visit Summary: (MyChart) Due to this being a telephonic visit, the after visit summary with patients personalized plan was offered to patient via MyChart   Nurse Notes:

## 2023-08-23 NOTE — Telephone Encounter (Signed)
Suprep sent to patients pharmacy

## 2023-08-24 ENCOUNTER — Ambulatory Visit (HOSPITAL_BASED_OUTPATIENT_CLINIC_OR_DEPARTMENT_OTHER): Admitting: Physical Therapy

## 2023-08-26 ENCOUNTER — Ambulatory Visit (HOSPITAL_BASED_OUTPATIENT_CLINIC_OR_DEPARTMENT_OTHER): Admitting: Physical Therapy

## 2023-08-30 ENCOUNTER — Ambulatory Visit (HOSPITAL_BASED_OUTPATIENT_CLINIC_OR_DEPARTMENT_OTHER): Admitting: Physical Therapy

## 2023-08-31 DIAGNOSIS — Z8669 Personal history of other diseases of the nervous system and sense organs: Secondary | ICD-10-CM | POA: Diagnosis not present

## 2023-09-01 ENCOUNTER — Ambulatory Visit (HOSPITAL_BASED_OUTPATIENT_CLINIC_OR_DEPARTMENT_OTHER): Admitting: Physical Therapy

## 2023-09-05 ENCOUNTER — Ambulatory Visit (AMBULATORY_SURGERY_CENTER): Admitting: Internal Medicine

## 2023-09-05 ENCOUNTER — Encounter: Payer: Self-pay | Admitting: Internal Medicine

## 2023-09-05 VITALS — BP 123/97 | HR 78 | Temp 97.2°F | Resp 11 | Ht 62.0 in | Wt 219.0 lb

## 2023-09-05 DIAGNOSIS — D175 Benign lipomatous neoplasm of intra-abdominal organs: Secondary | ICD-10-CM

## 2023-09-05 DIAGNOSIS — K648 Other hemorrhoids: Secondary | ICD-10-CM | POA: Diagnosis not present

## 2023-09-05 DIAGNOSIS — K5100 Ulcerative (chronic) pancolitis without complications: Secondary | ICD-10-CM

## 2023-09-05 DIAGNOSIS — Z1211 Encounter for screening for malignant neoplasm of colon: Secondary | ICD-10-CM | POA: Diagnosis not present

## 2023-09-05 MED ORDER — SULFASALAZINE 500 MG PO TABS: 1000.0000 mg | ORAL_TABLET | Freq: Two times a day (BID) | ORAL | 3 refills | Status: AC

## 2023-09-05 MED ORDER — FOLIC ACID 1 MG PO TABS: ORAL_TABLET | ORAL | 3 refills | Status: AC

## 2023-09-05 MED ORDER — SODIUM CHLORIDE 0.9 % IV SOLN: 500.0000 mL | Freq: Once | INTRAVENOUS | Status: DC

## 2023-09-05 NOTE — Op Note (Signed)
 [facility] Patient Name: [name] Procedure Date: 09/05/2023 9:17 AM MRN: [id number] Endoscopist: Gordy CHRISTELLA Starch , MD, [id number] Age: 68 Referring MD:  Date of Birth: 1955/11/16 Gender: Female Account #: [id number] Procedure:                Colonoscopy Indications:              High risk colon cancer surveillance: Ulcerative                            pancolitis of 8 (or more) years duration, Last                            colonoscopy: May 2022 (Mayo 0); current therapy                            sulfasalazine  and folic acid  Medicines:                Monitored Anesthesia Care Procedure:                Pre-Anesthesia Assessment:                           - Prior to the procedure, a History and Physical                            was performed, and patient medications and                            allergies were reviewed. The patient's tolerance of                            previous anesthesia was also reviewed. The risks                            and benefits of the procedure and the sedation                            options and risks were discussed with the patient.                            All questions were answered, and informed consent                            was obtained. Prior Anticoagulants: The patient has                            taken no anticoagulant or antiplatelet agents. ASA                            Grade Assessment: III - A patient with severe                            systemic disease. After reviewing the risks and  benefits, the patient was deemed in satisfactory                            condition to undergo the procedure.                           After obtaining informed consent, the colonoscope                            was passed under direct vision. Throughout the                            procedure, the patient's blood pressure, pulse, and                            oxygen saturations were monitored  continuously. The                            Olympus CF-HQ190L ([id number]) Colonoscope was                            introduced through the anus and advanced to the                            terminal ileum. The colonoscopy was performed                            without difficulty. The patient tolerated the                            procedure well. The quality of the bowel                            preparation was excellent. The terminal ileum,                            ileocecal valve, appendiceal orifice, and rectum                            were photographed. Scope In: 9:25:26 AM Scope Out: 9:35:22 AM Scope Withdrawal Time: 0 hours 7 minutes 49 seconds  Total Procedure Duration: 0 hours 9 minutes 56 seconds  Findings:                 The digital rectal exam was normal.                           The terminal ileum appeared normal.                           There was a small lipoma, in the ascending colon.                           Inflammation was not found based on the endoscopic  appearance of the mucosa in the colon. This was                            graded as Mayo Score 0 (normal or inactive                            disease), and when compared to the previous                            examination, the findings are in remission.                           Internal hemorrhoids were found during                            retroflexion. The hemorrhoids were small. Complications:            No immediate complications. Estimated Blood Loss:     Estimated blood loss: none. Impression:               - The examined portion of the ileum was normal.                           - Small lipoma in the ascending colon.                           - Inactive (Mayo Score 0) ulcerative colitis, in                            remission.                           - Small internal hemorrhoids.                           - No specimens collected. Recommendation:            - Patient has a contact number available for                            emergencies. The signs and symptoms of potential                            delayed complications were discussed with the                            patient. Return to normal activities tomorrow.                            Written discharge instructions were provided to the                            patient.                           - Resume previous diet.                           -  Continue present medications.                           - Repeat colonoscopy in 3 years for surveillance. Gordy CHRISTELLA Starch, MD 09/05/2023 9:42:30 AM This report has been signed electronically.

## 2023-09-05 NOTE — Progress Notes (Signed)
 GASTROENTEROLOGY PROCEDURE H&P NOTE   Primary Care Physician: Mahlon Comer BRAVO, MD    Reason for Procedure:  Pan ulcerative colitis  Plan:    Colonoscopy  Patient is appropriate for endoscopic procedure(s) in the ambulatory ([facility]) setting.  The nature of the procedure, as well as the risks, benefits, and alternatives were carefully and thoroughly reviewed with the patient. Ample time for discussion and questions allowed. The patient understood, was satisfied, and agreed to proceed.     HPI: [name] is a 68 y.o. female who presents for colonoscopy.  Medical history as below.  Tolerated the prep.  No recent chest pain or shortness of breath.  No abdominal pain today.  Past Medical History:  Diagnosis Date   Arthritis    Cataract    bilateral cataracts-has had surgery   COVID-19    GERD (gastroesophageal reflux disease)    Microcytic anemia    states in past as of 08/2023   Migraine    Scoliosis    Thalassemia    Ulcerative pancolitis (HCC)    Colonoscopy with Dr. Albertus   Vitamin D  deficiency     Past Surgical History:  Procedure Laterality Date   ABDOMINAL HYSTERECTOMY  2003   partial   ANTERIOR LAT LUMBAR FUSION N/A 01/28/2016   Procedure: ANTERIOR LATERAL LUMBAR FUSION 2 LEVELS XLIF L2-4;  Surgeon: Donaciano Sprang, MD;  Location: [facility] OR;  Service: Orthopedics;  Laterality: N/A;   BACK SURGERY     BREAST EXCISIONAL BIOPSY Bilateral    benign   BREAST REDUCTION SURGERY Bilateral 06/16/2017   Procedure: BILATERAL MAMMARY REDUCTION  (BREAST);  Surgeon: Lowery Estefana RAMAN, DO;  Location: [facility];  Service: Plastics;  Laterality: Bilateral;   BREAST SURGERY Bilateral    cysts 68 yrs old   CATARACT EXTRACTION Bilateral 08/2021   COLONOSCOPY  20 + years ago   In [facility]   KNEE SURGERY Left 12-13,11-10   meniscal repairs   LUMBAR FUSION  01/28/2016   ANTERIOR LATERAL LUMBAR FUSION 2 LEVELS XLIF L2-4 (N/A)   SPINAL CORD STIMULATOR  INSERTION N/A 01/11/2022   Procedure: SPINAL CORD STIMULATOR PLACEMENT;  Surgeon: Sprang Donaciano, MD;  Location: [facility] OR;  Service: Orthopedics;  Laterality: N/A;  3 hrs 3 C-Bed   TOTAL KNEE ARTHROPLASTY Left 01/14/2015   Procedure: TOTAL KNEE ARTHROPLASTY;  Surgeon: Donnice Car, MD;  Location: [facility];  Service: Orthopedics;  Laterality: Left;   TOTAL KNEE ARTHROPLASTY Right 11/13/2020   Procedure: TOTAL KNEE ARTHROPLASTY;  Surgeon: Car Donnice, MD;  Location: [facility];  Service: Orthopedics;  Laterality: Right;    Prior to Admission medications   Medication Sig Start Date End Date Taking? Authorizing Provider  folic acid  (FOLVITE ) 1 MG tablet TAKE 1 TABLET(1 MG) BY MOUTH DAILY 06/17/23  Yes Kennedy-Smith, Colleen M, NP  pantoprazole  (PROTONIX ) 40 MG tablet TAKE 1 TABLET (40 MG TOTAL) BY MOUTH 2 (TWO) TIMES DAILY. 06/17/23  Yes Kennedy-Smith, Colleen M, NP  sulfaSALAzine  (AZULFIDINE ) 500 MG tablet Take 2 tablets (1,000 mg total) by mouth 2 (two) times daily. 06/17/23  Yes Kennedy-Smith, Colleen M, NP  traMADol  (ULTRAM ) 50 MG tablet Take 50 mg by mouth every 6 (six) hours as needed. 06/03/23  Yes [provider]  calcium  carbonate (TUMS - DOSED IN MG ELEMENTAL CALCIUM ) 500 MG chewable tablet Chew 1 tablet by mouth daily as needed for indigestion or heartburn.    [provider]  SUMAtriptan  (IMITREX ) 100 MG tablet Take 1 tablet (100  mg total) by mouth every 2 (two) hours as needed for migraine. 08/03/23   Tabori, Katherine E, MD    Current Outpatient Medications  Medication Sig Dispense Refill   folic acid  (FOLVITE ) 1 MG tablet TAKE 1 TABLET(1 MG) BY MOUTH DAILY 90 tablet 3   pantoprazole  (PROTONIX ) 40 MG tablet TAKE 1 TABLET (40 MG TOTAL) BY MOUTH 2 (TWO) TIMES DAILY. 180 tablet 3   sulfaSALAzine  (AZULFIDINE ) 500 MG tablet Take 2 tablets (1,000 mg total) by mouth 2 (two) times daily. 360 tablet 3   traMADol  (ULTRAM ) 50 MG tablet Take 50 mg by mouth every 6 (six) hours as needed.      calcium  carbonate (TUMS - DOSED IN MG ELEMENTAL CALCIUM ) 500 MG chewable tablet Chew 1 tablet by mouth daily as needed for indigestion or heartburn.     SUMAtriptan  (IMITREX ) 100 MG tablet Take 1 tablet (100 mg total) by mouth every 2 (two) hours as needed for migraine. 10 tablet 6   Current Facility-Administered Medications  Medication Dose Route Frequency Provider Last Rate Last Admin   0.9 %  sodium chloride  infusion  500 mL Intravenous Once Ofilia Rayon, Gordy HERO, MD        Allergies as of 09/05/2023   (No Known Allergies)    Family History  Problem Relation Age of Onset   Sickle cell trait Mother    Diabetes Brother    Diabetes Daughter    Asthma Other    Colon cancer Neg Hx    Esophageal cancer Neg Hx    Rectal cancer Neg Hx    Stomach cancer Neg Hx    Colon polyps Neg Hx     Social History   Socioeconomic History   Marital status: Divorced    Spouse name: Not on file   Number of children: 3   Years of education: college   Highest education level: Bachelor's degree (e.g., BA, AB, BS)  Occupational History    Employer: [employer]    Comment: Computers  Tobacco Use   Smoking status: Never   Smokeless tobacco: Never  Vaping Use   Vaping status: Never Used  Substance and Sexual Activity   Alcohol use: Never   Drug use: Never   Sexual activity: Not Currently    Birth control/protection: Surgical, Post-menopausal  Other Topics Concern   Not on file  Social History Narrative   Patient lives at home alone and she is divorced. Patient works with computers. Patient has a college education.   Right handed.   Caffeine.- one cup daily.     Social Drivers of Corporate investment banker Strain: Low Risk  (08/23/2023)   Overall Financial Resource Strain (CARDIA)    Difficulty of Paying Living Expenses: Not very hard  Food Insecurity: Food Insecurity Present (08/23/2023)   Hunger Vital Sign    Worried About Running Out of Food in the Last Year: Sometimes true    Ran  Out of Food in the Last Year: Never true  Transportation Needs: Unmet Transportation Needs (08/23/2023)   PRAPARE - Administrator, Civil Service (Medical): Yes    Lack of Transportation (Non-Medical): Yes  Physical Activity: Inactive (08/23/2023)   Exercise Vital Sign    Days of Exercise per Week: 0 days    Minutes of Exercise per Session: 0 min  Stress: No Stress Concern Present (08/23/2023)   [employer] of Occupational Health - Occupational Stress Questionnaire    Feeling of Stress: Only a little  Social  Connections: Moderately Isolated (08/23/2023)   Social Connection and Isolation Panel    Frequency of Communication with Friends and Family: More than three times a week    Frequency of Social Gatherings with Friends and Family: More than three times a week    Attends Religious Services: More than 4 times per year    Active Member of [employer] or Organizations: No    Attends Banker Meetings: Not on file    Marital Status: Divorced  Intimate Partner Violence: Not At Risk (08/23/2023)   Humiliation, Afraid, Rape, and Kick questionnaire    Fear of Current or Ex-Partner: No    Emotionally Abused: No    Physically Abused: No    Sexually Abused: No    Physical Exam: Vital signs in last 24 hours: @BP  138/66   Pulse 80   Temp (!) 97.2 F (36.2 C) (Temporal)   Ht 5' 2 (1.575 m)   Wt 219 lb (99.3 kg)   SpO2 99%   BMI 40.06 kg/m  GEN: NAD EYE: Sclerae anicteric ENT: MMM CV: Non-tachycardic Pulm: CTA b/l GI: Soft, NT/ND NEURO:  Alert & Oriented x 3   Gordy Starch, MD [facility]  09/05/2023 9:17 AM

## 2023-09-05 NOTE — Progress Notes (Signed)
 Report to PACU, RN, vss, BBS= Clear.

## 2023-09-05 NOTE — Patient Instructions (Addendum)
 Educational handout provided to patient related to Hemorrhoids  Resume previous diet  Continue present medications  Repeat colonoscopy in 3 years for surveillance  YOU HAD AN ENDOSCOPIC PROCEDURE TODAY AT THE [facility]:   Refer to the procedure report that was given to you for any specific questions about what was found during the examination.  If the procedure report does not answer your questions, please call your gastroenterologist to clarify.  If you requested that your care partner not be given the details of your procedure findings, then the procedure report has been included in a sealed envelope for you to review at your convenience later.  YOU SHOULD EXPECT: Some feelings of bloating in the abdomen. Passage of more gas than usual.  Walking can help get rid of the air that was put into your GI tract during the procedure and reduce the bloating. If you had a lower endoscopy (such as a colonoscopy or flexible sigmoidoscopy) you may notice spotting of blood in your stool or on the toilet paper. If you underwent a bowel prep for your procedure, you may not have a normal bowel movement for a few days.  Please Note:  You might notice some irritation and congestion in your nose or some drainage.  This is from the oxygen used during your procedure.  There is no need for concern and it should clear up in a day or so.  SYMPTOMS TO REPORT IMMEDIATELY:  Following lower endoscopy (colonoscopy or flexible sigmoidoscopy):  Excessive amounts of blood in the stool  Significant tenderness or worsening of abdominal pains  Swelling of the abdomen that is new, acute  Fever of 100F or higher  For urgent or emergent issues, a gastroenterologist can be reached at any hour by calling [phone]. Do not use MyChart messaging for urgent concerns.    DIET:  We do recommend a small meal at first, but then you may proceed to your regular diet.  Drink plenty of fluids but you should avoid  alcoholic beverages for 24 hours.  ACTIVITY:  You should plan to take it easy for the rest of today and you should NOT DRIVE or use heavy machinery until tomorrow (because of the sedation medicines used during the test).    FOLLOW UP: Our staff will call the number listed on your records the next business day following your procedure.  We will call around 7:15- 8:00 am to check on you and address any questions or concerns that you may have regarding the information given to you following your procedure. If we do not reach you, we will leave a message.     If any biopsies were taken you will be contacted by phone or by letter within the next 1-3 weeks.  Please call us  at [phone] if you have not heard about the biopsies in 3 weeks.    SIGNATURES/CONFIDENTIALITY: You and/or your care partner have signed paperwork which will be entered into your electronic medical record.  These signatures attest to the fact that that the information above on your After Visit Summary has been reviewed and is understood.  Full responsibility of the confidentiality of this discharge information lies with you and/or your care-partner.

## 2023-09-05 NOTE — Progress Notes (Signed)
 Updated medical record.

## 2023-09-06 ENCOUNTER — Telehealth: Payer: Self-pay | Admitting: *Deleted

## 2023-09-06 ENCOUNTER — Ambulatory Visit (HOSPITAL_BASED_OUTPATIENT_CLINIC_OR_DEPARTMENT_OTHER): Admitting: Physical Therapy

## 2023-09-06 NOTE — Telephone Encounter (Signed)
  Follow up Call-     09/05/2023    8:19 AM  Call back number  Post procedure Call Back phone  # [phone]  Permission to leave phone message Yes     Patient questions:  Do you have a fever, pain , or abdominal swelling? No. Pain Score  0 *  Have you tolerated food without any problems? Yes.    Have you been able to return to your normal activities? Yes.    Do you have any questions about your discharge instructions: Diet   No. Medications  No. Follow up visit  No.  Do you have questions or concerns about your Care? No.  Actions: * If pain score is 4 or above: No action needed, pain <4.

## 2023-09-08 ENCOUNTER — Ambulatory Visit (HOSPITAL_BASED_OUTPATIENT_CLINIC_OR_DEPARTMENT_OTHER): Attending: Orthopedic Surgery | Admitting: Physical Therapy

## 2023-09-13 ENCOUNTER — Ambulatory Visit (HOSPITAL_BASED_OUTPATIENT_CLINIC_OR_DEPARTMENT_OTHER): Admitting: Physical Therapy

## 2023-09-15 ENCOUNTER — Ambulatory Visit (HOSPITAL_BASED_OUTPATIENT_CLINIC_OR_DEPARTMENT_OTHER): Admitting: Physical Therapy

## 2023-09-20 ENCOUNTER — Ambulatory Visit (HOSPITAL_BASED_OUTPATIENT_CLINIC_OR_DEPARTMENT_OTHER): Admitting: Physical Therapy

## 2023-09-22 ENCOUNTER — Ambulatory Visit (HOSPITAL_BASED_OUTPATIENT_CLINIC_OR_DEPARTMENT_OTHER): Admitting: Physical Therapy

## 2023-09-27 ENCOUNTER — Ambulatory Visit (HOSPITAL_BASED_OUTPATIENT_CLINIC_OR_DEPARTMENT_OTHER): Admitting: Physical Therapy

## 2023-09-29 ENCOUNTER — Ambulatory Visit (HOSPITAL_BASED_OUTPATIENT_CLINIC_OR_DEPARTMENT_OTHER): Admitting: Physical Therapy

## 2023-10-03 DIAGNOSIS — M5416 Radiculopathy, lumbar region: Secondary | ICD-10-CM | POA: Diagnosis not present

## 2023-10-04 ENCOUNTER — Other Ambulatory Visit: Payer: Self-pay | Admitting: Physician Assistant

## 2023-10-04 DIAGNOSIS — M545 Low back pain, unspecified: Secondary | ICD-10-CM

## 2023-10-05 DIAGNOSIS — M5416 Radiculopathy, lumbar region: Secondary | ICD-10-CM | POA: Diagnosis not present

## 2023-10-05 DIAGNOSIS — M79605 Pain in left leg: Secondary | ICD-10-CM | POA: Diagnosis not present

## 2023-10-07 ENCOUNTER — Ambulatory Visit
Admission: RE | Admit: 2023-10-07 | Discharge: 2023-10-07 | Disposition: A | Discharge status: Home / Self Care | Source: Ambulatory Visit | Attending: Physician Assistant | Admitting: Physician Assistant

## 2023-10-07 ENCOUNTER — Other Ambulatory Visit

## 2023-10-07 DIAGNOSIS — M4727 Other spondylosis with radiculopathy, lumbosacral region: Secondary | ICD-10-CM | POA: Diagnosis not present

## 2023-10-07 DIAGNOSIS — M545 Low back pain, unspecified: Secondary | ICD-10-CM

## 2023-10-07 DIAGNOSIS — M48061 Spinal stenosis, lumbar region without neurogenic claudication: Secondary | ICD-10-CM | POA: Diagnosis not present

## 2023-10-24 ENCOUNTER — Telehealth: Payer: Self-pay

## 2023-10-24 ENCOUNTER — Ambulatory Visit: Payer: Self-pay

## 2023-10-24 NOTE — Telephone Encounter (Signed)
 Patient needs a new location for bone density referral. She does not have a preference.

## 2023-10-24 NOTE — Telephone Encounter (Signed)
 FYI Only or Action Required?: Action required by provider: request for appointment. Appt 10/26/23.  Patient was last seen in primary care on 08/03/2023 by Mahlon Comer BRAVO, MD.  Called Nurse Triage reporting Cyst.  Symptoms began several days ago.  Interventions attempted: Nothing.  Symptoms are: gradually worsening.  Triage Disposition: See PCP When Office is Open (Within 3 Days)  Patient/caregiver understands and will follow disposition?: Yes     Copied from CRM #[id number]. Topic: Clinical - Red Word Triage >> Oct 24, 2023 11:37 AM [name] wrote: [facility] that prompted transfer to Nurse Triage: possible infection  cyst draining for about 3-4 months, possibly infected. leaking puss, its smelly, no other symptoms just smelly Reason for Disposition  [1] Small swelling or lump AND [2] unexplained AND [3] present > 1 week  Answer Assessment - Initial Assessment Questions Scheduled appt 10/26/23. Advised UC/ED if symptoms worsen.   It keeps draining now, not healing and smells; it use to heal up once squeezed; had for years; new symptoms  1. APPEARANCE of SWELLING: What does it look like?     No redness, swelling Drainage thick dark brownish, smelly 2. SIZE: How large is the swelling? (e.g., inches, cm; or compare to size of pinhead, tip of pen, eraser, coin, pea, grape, ping pong ball)      Flat right now, was full of pus, but squeezed out pus; dark brownish 3. LOCATION: Where is the swelling located?     Back; mid back, center 4. ONSET: When did the swelling start?     Cyst noticed years ago; exploded and went down, but it flares up every so often 5. COLOR: What color is it? Is there more than one color?     Darker than skin, not red or purple 6. PAIN: Is there any pain? If Yes, ask: How bad is the pain? (Scale 1-10; or mild, moderate, severe)       0 7. ITCH: Does it itch? If Yes, ask: How bad is the itch?      Sometimes 9 OTHER SYMPTOMS: Do you have  any other symptoms? (e.g., fever)     No fever, swelling  Protocols used: Skin Lump or Localized Swelling-A-AH

## 2023-10-24 NOTE — Telephone Encounter (Signed)
 Copied from CRM #[id number]. Topic: Referral - Status >> Oct 24, 2023 11:24 AM [name] wrote: Reason for CRM: pt says the place that the referral was put in for the bone density test they adv they no longer do the testing, pt will need a new referral to a  diff location in regarding to this. Pt callback # is [phone]

## 2023-10-24 NOTE — Telephone Encounter (Signed)
 If she has a smelly, draining wound she should not wait until next Friday (9/19) to be seen.  Needs to be seen sooner (but triage note says appt 9/10- which would be ok)

## 2023-10-24 NOTE — Telephone Encounter (Signed)
 Patient is coming in to see you on 11/04/23 for a cyst. See notes below.

## 2023-10-25 NOTE — Telephone Encounter (Signed)
 Per provider ok to see 10/26/2023

## 2023-10-26 ENCOUNTER — Ambulatory Visit: Admitting: Family Medicine

## 2023-10-26 ENCOUNTER — Encounter: Payer: Self-pay | Admitting: Family Medicine

## 2023-10-26 VITALS — BP 140/78 | HR 76 | Temp 97.8°F | Ht 62.0 in | Wt 204.0 lb

## 2023-10-26 DIAGNOSIS — Z1231 Encounter for screening mammogram for malignant neoplasm of breast: Secondary | ICD-10-CM

## 2023-10-26 DIAGNOSIS — Z13820 Encounter for screening for osteoporosis: Secondary | ICD-10-CM

## 2023-10-26 DIAGNOSIS — L723 Sebaceous cyst: Secondary | ICD-10-CM

## 2023-10-26 NOTE — Progress Notes (Signed)
   Subjective:    Patient ID: [name], female    DOB: [birth year], 68 y.o.   MRN: [id number]  HPI Cyst- on upper back, present for years.  Pt reports it will episodically enlarge, drain, and then go back to normal.  This time area continues to drain and has been since early summer.   Review of Systems For ROS see HPI     Objective:   Physical Exam Vitals reviewed.  Constitutional:      General: She is not in acute distress.    Appearance: Normal appearance. She is not ill-appearing.  Skin:    General: Skin is warm and dry.     Comments: Cyst of mid upper back.  No evidence of infxn.  Small amount of dark, foul smelling debris expressed w/ firm pressure.  Neurological:     General: No focal deficit present.     Mental Status: She is alert and oriented to person, place, and time.  Psychiatric:        Mood and Affect: Mood normal.        Behavior: Behavior normal.        Thought Content: Thought content normal.           Assessment & Plan:  Cyst- new to provider, ongoing for pt.  Reports this has been draining since early summer.  No induration, redness, or pain.  No need for abx.  Small amount of foul smelling, dark debris expressed today in office.  Pt would like definitive tx- will refer to Surgery.  Pt expressed understanding and is in agreement w/ plan.

## 2023-10-26 NOTE — Patient Instructions (Signed)
 Follow up as needed or as scheduled We'll call you to schedule your surgery appt The order for your bone density and mammogram are entered Call with any questions or concerns Stay Safe!  Stay Healthy! Happy Fall!

## 2023-11-07 NOTE — Telephone Encounter (Signed)
 Spoke to patient. Provided phone number for [facility] Centralized Scheduling for Radiology [phone]. I let her know they will be able to view all schedules at all sites that do bone density tests. She wrote down the number and states she will call them to schedule.

## 2023-11-10 DIAGNOSIS — L720 Epidermal cyst: Secondary | ICD-10-CM | POA: Diagnosis not present

## 2023-11-22 ENCOUNTER — Other Ambulatory Visit (HOSPITAL_BASED_OUTPATIENT_CLINIC_OR_DEPARTMENT_OTHER)

## 2023-11-22 ENCOUNTER — Ambulatory Visit (HOSPITAL_BASED_OUTPATIENT_CLINIC_OR_DEPARTMENT_OTHER)

## 2023-12-05 ENCOUNTER — Ambulatory Visit (HOSPITAL_BASED_OUTPATIENT_CLINIC_OR_DEPARTMENT_OTHER)
Admission: RE | Admit: 2023-12-05 | Discharge: 2023-12-05 | Disposition: A | Discharge status: Home / Self Care | Source: Ambulatory Visit | Attending: Family Medicine | Admitting: Family Medicine

## 2023-12-05 ENCOUNTER — Encounter (HOSPITAL_BASED_OUTPATIENT_CLINIC_OR_DEPARTMENT_OTHER): Payer: Self-pay

## 2023-12-05 DIAGNOSIS — Z13820 Encounter for screening for osteoporosis: Secondary | ICD-10-CM | POA: Insufficient documentation

## 2023-12-05 DIAGNOSIS — M8589 Other specified disorders of bone density and structure, multiple sites: Secondary | ICD-10-CM | POA: Insufficient documentation

## 2023-12-05 DIAGNOSIS — M85851 Other specified disorders of bone density and structure, right thigh: Secondary | ICD-10-CM | POA: Diagnosis not present

## 2023-12-05 DIAGNOSIS — Z1231 Encounter for screening mammogram for malignant neoplasm of breast: Secondary | ICD-10-CM | POA: Diagnosis not present

## 2023-12-05 DIAGNOSIS — Z780 Asymptomatic menopausal state: Secondary | ICD-10-CM | POA: Diagnosis not present

## 2023-12-06 DIAGNOSIS — Z79899 Other long term (current) drug therapy: Secondary | ICD-10-CM | POA: Diagnosis not present

## 2023-12-06 DIAGNOSIS — M5416 Radiculopathy, lumbar region: Secondary | ICD-10-CM | POA: Diagnosis not present

## 2023-12-07 ENCOUNTER — Ambulatory Visit: Payer: Self-pay | Admitting: Family Medicine

## 2024-02-13 ENCOUNTER — Telehealth: Payer: Self-pay | Admitting: Family Medicine

## 2024-02-13 ENCOUNTER — Telehealth: Payer: Self-pay

## 2024-02-13 MED ORDER — MUPIROCIN 2 % EX OINT: 1.0000 | TOPICAL_OINTMENT | Freq: Two times a day (BID) | CUTANEOUS | 1 refills | Status: AC

## 2024-02-13 NOTE — Telephone Encounter (Signed)
 Prescription sent to pharmacy.

## 2024-02-13 NOTE — Telephone Encounter (Signed)
 Patient stated I am having the same issue I was having. I explained that patient hasn't had medication refill in 3 years and we do not know what the medication was used for. She said that she has UC and she has a place on her bottom, again. Richard told her to use that cream to get rid of the issue whenever it arises. I explained that she may have to come in for an appointment.

## 2024-02-13 NOTE — Telephone Encounter (Signed)
 Called patient and informed her that her medication was sent in to her pharmacy.

## 2024-02-13 NOTE — Telephone Encounter (Signed)
 Patient informed that medication was sent in to her pharmacy. Patient verbalized understanding.

## 2024-02-13 NOTE — Telephone Encounter (Signed)
 Copied from CRM #[id number]. Topic: Clinical - Medication Refill >> Feb 13, 2024  2:31 PM Drema MATSU wrote: Medication: Mupinocin   Has the patient contacted their pharmacy? No (Agent: If no, request that the patient contact the pharmacy for the refill. If patient does not wish to contact the pharmacy document the reason why and proceed with request.) (Agent: If yes, when and what did the pharmacy advise?)  This is the patient's preferred pharmacy:  [id number] - [place], [place] - [address]OSS [address] [place] [address] Phone: [phone] Fax: [phone]  Is this the correct pharmacy for this prescription? Yes If no, delete pharmacy and type the correct one.   Has the prescription been filled recently? No  Is the patient out of the medication? Yes  Has the patient been seen for an appointment in the last year OR does the patient have an upcoming appointment? Yes  Can we respond through MyChart? Yes  Agent: Please be advised that Rx refills may take up to 3 business days. We ask that you follow-up with your pharmacy.

## 2024-02-13 NOTE — Telephone Encounter (Signed)
 Can we please ask why she needs this as it is a prescription antibiotic cream and I don't want to build antibiotic resistance if it's not necessary

## 2024-02-13 NOTE — Telephone Encounter (Signed)
 Patient is requesting mupirocin  ointment (BACTROBAN ) 2 % - I see medication in medication history from 3 years ago prescribed by Richard Murrow. Okay to refill?

## 2024-03-08 ENCOUNTER — Other Ambulatory Visit (HOSPITAL_BASED_OUTPATIENT_CLINIC_OR_DEPARTMENT_OTHER): Payer: Self-pay

## 2024-03-08 DIAGNOSIS — Z1231 Encounter for screening mammogram for malignant neoplasm of breast: Secondary | ICD-10-CM

## 2024-08-28 ENCOUNTER — Encounter

## 2024-12-06 ENCOUNTER — Ambulatory Visit (HOSPITAL_BASED_OUTPATIENT_CLINIC_OR_DEPARTMENT_OTHER)
